# Patient Record
Sex: Female | Born: 1965 | Race: White | Hispanic: No | Marital: Married
Health system: Southern US, Community
[De-identification: ages and names within clinical notes are randomized; demographics above are authoritative.]

## PROBLEM LIST (undated history)

## (undated) DIAGNOSIS — E119 Type 2 diabetes mellitus without complications: Secondary | ICD-10-CM

## (undated) DIAGNOSIS — I10 Essential (primary) hypertension: Secondary | ICD-10-CM

## (undated) DIAGNOSIS — E785 Hyperlipidemia, unspecified: Secondary | ICD-10-CM

## (undated) HISTORY — DX: Hyperlipidemia, unspecified: E78.5

## (undated) HISTORY — DX: Type 2 diabetes mellitus without complications: E11.9

## (undated) HISTORY — DX: Essential (primary) hypertension: I10

---

## 2001-05-13 ENCOUNTER — Encounter: Payer: Self-pay | Admitting: Family Medicine

## 2001-05-13 ENCOUNTER — Ambulatory Visit (HOSPITAL_COMMUNITY): Admission: RE | Admit: 2001-05-13 | Discharge: 2001-05-13 | Payer: Self-pay | Admitting: Family Medicine

## 2002-08-24 ENCOUNTER — Ambulatory Visit (HOSPITAL_COMMUNITY): Admission: RE | Admit: 2002-08-24 | Discharge: 2002-08-24 | Payer: Self-pay | Admitting: Family Medicine

## 2002-08-24 ENCOUNTER — Encounter: Payer: Self-pay | Admitting: Family Medicine

## 2002-09-01 ENCOUNTER — Ambulatory Visit (HOSPITAL_COMMUNITY): Admission: RE | Admit: 2002-09-01 | Discharge: 2002-09-01 | Payer: Self-pay | Admitting: Family Medicine

## 2002-09-01 ENCOUNTER — Encounter: Payer: Self-pay | Admitting: Family Medicine

## 2004-02-01 ENCOUNTER — Ambulatory Visit (HOSPITAL_COMMUNITY): Admission: RE | Admit: 2004-02-01 | Discharge: 2004-02-01 | Payer: Self-pay | Admitting: Family Medicine

## 2004-03-14 ENCOUNTER — Encounter: Admission: RE | Admit: 2004-03-14 | Discharge: 2004-06-12 | Payer: Self-pay | Admitting: Family Medicine

## 2005-04-16 ENCOUNTER — Ambulatory Visit (HOSPITAL_COMMUNITY): Admission: RE | Admit: 2005-04-16 | Discharge: 2005-04-16 | Payer: Self-pay | Admitting: Family Medicine

## 2006-09-12 ENCOUNTER — Other Ambulatory Visit: Admission: RE | Admit: 2006-09-12 | Discharge: 2006-09-12 | Payer: Self-pay | Admitting: Family Medicine

## 2007-05-01 ENCOUNTER — Ambulatory Visit (HOSPITAL_COMMUNITY): Admission: RE | Admit: 2007-05-01 | Discharge: 2007-05-01 | Payer: Self-pay | Admitting: Family Medicine

## 2008-03-01 ENCOUNTER — Other Ambulatory Visit: Admission: RE | Admit: 2008-03-01 | Discharge: 2008-03-01 | Payer: Self-pay | Admitting: Family Medicine

## 2008-11-16 ENCOUNTER — Ambulatory Visit (HOSPITAL_COMMUNITY): Admission: RE | Admit: 2008-11-16 | Discharge: 2008-11-16 | Payer: Self-pay | Admitting: Family Medicine

## 2008-11-22 ENCOUNTER — Encounter: Admission: RE | Admit: 2008-11-22 | Discharge: 2008-11-22 | Payer: Self-pay | Admitting: Family Medicine

## 2009-04-07 ENCOUNTER — Other Ambulatory Visit: Admission: RE | Admit: 2009-04-07 | Discharge: 2009-04-07 | Payer: Self-pay | Admitting: Family Medicine

## 2010-04-10 ENCOUNTER — Other Ambulatory Visit: Admission: RE | Admit: 2010-04-10 | Discharge: 2010-04-10 | Payer: Self-pay | Admitting: Family Medicine

## 2010-06-15 ENCOUNTER — Ambulatory Visit (HOSPITAL_COMMUNITY)
Admission: RE | Admit: 2010-06-15 | Discharge: 2010-06-15 | Payer: Self-pay | Source: Home / Self Care | Attending: Family Medicine | Admitting: Family Medicine

## 2010-07-30 ENCOUNTER — Encounter: Payer: Self-pay | Admitting: Family Medicine

## 2011-11-20 ENCOUNTER — Other Ambulatory Visit (HOSPITAL_COMMUNITY): Payer: Self-pay | Admitting: Family Medicine

## 2011-11-20 DIAGNOSIS — Z1231 Encounter for screening mammogram for malignant neoplasm of breast: Secondary | ICD-10-CM

## 2011-12-18 ENCOUNTER — Ambulatory Visit (HOSPITAL_COMMUNITY)
Admission: RE | Admit: 2011-12-18 | Discharge: 2011-12-18 | Disposition: A | Discharge status: Home / Self Care | Payer: BC Managed Care – PPO | Source: Ambulatory Visit | Attending: Family Medicine | Admitting: Family Medicine

## 2011-12-18 DIAGNOSIS — Z1231 Encounter for screening mammogram for malignant neoplasm of breast: Secondary | ICD-10-CM | POA: Insufficient documentation

## 2012-11-03 DIAGNOSIS — E669 Obesity, unspecified: Secondary | ICD-10-CM | POA: Insufficient documentation

## 2012-11-03 DIAGNOSIS — E7800 Pure hypercholesterolemia, unspecified: Secondary | ICD-10-CM | POA: Insufficient documentation

## 2012-11-03 DIAGNOSIS — F509 Eating disorder, unspecified: Secondary | ICD-10-CM | POA: Insufficient documentation

## 2012-11-03 DIAGNOSIS — E6609 Other obesity due to excess calories: Secondary | ICD-10-CM | POA: Insufficient documentation

## 2012-11-03 DIAGNOSIS — E1169 Type 2 diabetes mellitus with other specified complication: Secondary | ICD-10-CM | POA: Insufficient documentation

## 2012-11-03 DIAGNOSIS — E119 Type 2 diabetes mellitus without complications: Secondary | ICD-10-CM | POA: Insufficient documentation

## 2012-11-03 DIAGNOSIS — I10 Essential (primary) hypertension: Secondary | ICD-10-CM | POA: Insufficient documentation

## 2013-04-08 ENCOUNTER — Other Ambulatory Visit (HOSPITAL_COMMUNITY): Payer: Self-pay | Admitting: Family Medicine

## 2013-04-08 DIAGNOSIS — Z1231 Encounter for screening mammogram for malignant neoplasm of breast: Secondary | ICD-10-CM

## 2013-04-14 ENCOUNTER — Ambulatory Visit (HOSPITAL_COMMUNITY)
Admission: RE | Admit: 2013-04-14 | Discharge: 2013-04-14 | Disposition: A | Discharge status: Home / Self Care | Payer: PRIVATE HEALTH INSURANCE | Source: Ambulatory Visit | Attending: Family Medicine | Admitting: Family Medicine

## 2013-04-14 DIAGNOSIS — Z1231 Encounter for screening mammogram for malignant neoplasm of breast: Secondary | ICD-10-CM | POA: Insufficient documentation

## 2014-09-01 ENCOUNTER — Other Ambulatory Visit: Payer: Self-pay

## 2014-09-01 DIAGNOSIS — Z1231 Encounter for screening mammogram for malignant neoplasm of breast: Secondary | ICD-10-CM

## 2014-09-09 ENCOUNTER — Ambulatory Visit
Admission: RE | Admit: 2014-09-09 | Discharge: 2014-09-09 | Disposition: A | Discharge status: Home / Self Care | Payer: No Typology Code available for payment source | Source: Ambulatory Visit

## 2014-09-09 DIAGNOSIS — Z1231 Encounter for screening mammogram for malignant neoplasm of breast: Secondary | ICD-10-CM

## 2014-12-20 ENCOUNTER — Other Ambulatory Visit: Payer: Self-pay | Admitting: Obstetrics and Gynecology

## 2014-12-20 ENCOUNTER — Other Ambulatory Visit (HOSPITAL_COMMUNITY)
Admission: RE | Admit: 2014-12-20 | Discharge: 2014-12-20 | Disposition: A | Discharge status: Home / Self Care | Payer: No Typology Code available for payment source | Source: Ambulatory Visit | Attending: Obstetrics and Gynecology | Admitting: Obstetrics and Gynecology

## 2014-12-20 DIAGNOSIS — Z1151 Encounter for screening for human papillomavirus (HPV): Secondary | ICD-10-CM | POA: Diagnosis present

## 2014-12-20 DIAGNOSIS — Z01419 Encounter for gynecological examination (general) (routine) without abnormal findings: Secondary | ICD-10-CM | POA: Insufficient documentation

## 2014-12-23 LAB — CYTOLOGY - PAP

## 2016-01-12 ENCOUNTER — Other Ambulatory Visit: Payer: Self-pay | Admitting: Family Medicine

## 2016-01-13 LAB — CMP12+LP+TP+TSH+6AC+CBC/D/PLT
A/G RATIO: 1.7 (ref 1.2–2.2)
ALK PHOS: 85 IU/L (ref 39–117)
ALT: 33 IU/L — ABNORMAL HIGH (ref 0–32)
AST: 21 IU/L (ref 0–40)
Albumin: 3.9 g/dL (ref 3.5–5.5)
BASOS: 0 %
BILIRUBIN TOTAL: 0.2 mg/dL (ref 0.0–1.2)
BUN/Creatinine Ratio: 16 (ref 9–23)
BUN: 12 mg/dL (ref 6–24)
Basophils Absolute: 0 10*3/uL (ref 0.0–0.2)
CHLORIDE: 95 mmol/L — AB (ref 96–106)
CHOL/HDL RATIO: 3.1 ratio (ref 0.0–4.4)
CREATININE: 0.76 mg/dL (ref 0.57–1.00)
Calcium: 8.6 mg/dL — ABNORMAL LOW (ref 8.7–10.2)
Cholesterol, Total: 164 mg/dL (ref 100–199)
EOS (ABSOLUTE): 0.3 10*3/uL (ref 0.0–0.4)
EOS: 4 %
Estimated CHD Risk: <0.5 times avg. (ref 0.0–1.0)
Free Thyroxine Index: 2.4 (ref 1.2–4.9)
GFR, EST AFRICAN AMERICAN: 106 mL/min/{1.73_m2} (ref >59)
GFR, EST NON AFRICAN AMERICAN: 92 mL/min/{1.73_m2} (ref >59)
GGT: 49 IU/L (ref 0–60)
GLUCOSE: 102 mg/dL — AB (ref 65–99)
Globulin, Total: 2.3 g/dL (ref 1.5–4.5)
HDL: 53 mg/dL (ref >39)
HEMATOCRIT: 42.2 % (ref 34.0–46.6)
HEMOGLOBIN: 13.3 g/dL (ref 11.1–15.9)
IRON: 75 ug/dL (ref 27–159)
Immature Grans (Abs): 0 10*3/uL (ref 0.0–0.1)
Immature Granulocytes: 0 %
LDH: 168 IU/L (ref 119–226)
LDL CALC: 81 mg/dL (ref 0–99)
Lymphocytes Absolute: 1.8 10*3/uL (ref 0.7–3.1)
Lymphs: 24 %
MCH: 26.9 pg (ref 26.6–33.0)
MCHC: 31.5 g/dL (ref 31.5–35.7)
MCV: 85 fL (ref 79–97)
MONOCYTES: 7 %
Monocytes Absolute: 0.5 10*3/uL (ref 0.1–0.9)
Neutrophils Absolute: 5.1 10*3/uL (ref 1.4–7.0)
Neutrophils: 65 %
PLATELETS: 410 10*3/uL — AB (ref 150–379)
POTASSIUM: 4.4 mmol/L (ref 3.5–5.2)
Phosphorus: 3.6 mg/dL (ref 2.5–4.5)
RBC: 4.94 x10E6/uL (ref 3.77–5.28)
RDW: 13.8 % (ref 12.3–15.4)
Sodium: 136 mmol/L (ref 134–144)
T3 UPTAKE RATIO: 30 % (ref 24–39)
T4, Total: 8.1 ug/dL (ref 4.5–12.0)
TSH: 1.16 u[IU]/mL (ref 0.450–4.500)
Total Protein: 6.2 g/dL (ref 6.0–8.5)
Triglycerides: 148 mg/dL (ref 0–149)
URIC ACID: 6.6 mg/dL (ref 2.5–7.1)
VLDL CHOLESTEROL CAL: 30 mg/dL (ref 5–40)
WBC: 7.7 10*3/uL (ref 3.4–10.8)

## 2016-01-13 LAB — HGB A1C W/O EAG: HEMOGLOBIN A1C: 6.6 % — AB (ref 4.8–5.6)

## 2016-01-13 LAB — VITAMIN D 25 HYDROXY (VIT D DEFICIENCY, FRACTURES): Vit D, 25-Hydroxy: 38.2 ng/mL (ref 30.0–100.0)

## 2016-02-07 ENCOUNTER — Other Ambulatory Visit: Payer: Self-pay | Admitting: Family Medicine

## 2016-02-07 DIAGNOSIS — R519 Headache, unspecified: Secondary | ICD-10-CM

## 2016-02-07 DIAGNOSIS — H532 Diplopia: Secondary | ICD-10-CM

## 2016-02-07 DIAGNOSIS — R51 Headache: Secondary | ICD-10-CM

## 2016-02-10 ENCOUNTER — Ambulatory Visit
Admission: RE | Admit: 2016-02-10 | Discharge: 2016-02-10 | Disposition: A | Discharge status: Home / Self Care | Payer: No Typology Code available for payment source | Source: Ambulatory Visit | Attending: Family Medicine | Admitting: Family Medicine

## 2016-02-10 DIAGNOSIS — H532 Diplopia: Secondary | ICD-10-CM

## 2016-02-10 DIAGNOSIS — R51 Headache: Secondary | ICD-10-CM

## 2016-02-10 DIAGNOSIS — R519 Headache, unspecified: Secondary | ICD-10-CM

## 2016-02-10 MED ORDER — GADOBENATE DIMEGLUMINE 529 MG/ML IV SOLN
20.0000 mL | Freq: Once | INTRAVENOUS | Status: AC | PRN
  Administered 2016-02-10: 20 mL via INTRAVENOUS

## 2016-04-20 ENCOUNTER — Other Ambulatory Visit: Payer: Self-pay | Admitting: Family Medicine

## 2016-04-20 DIAGNOSIS — Z1231 Encounter for screening mammogram for malignant neoplasm of breast: Secondary | ICD-10-CM

## 2016-04-30 ENCOUNTER — Other Ambulatory Visit: Payer: Self-pay | Admitting: Family Medicine

## 2016-04-30 DIAGNOSIS — Z1231 Encounter for screening mammogram for malignant neoplasm of breast: Secondary | ICD-10-CM

## 2016-05-21 ENCOUNTER — Ambulatory Visit
Admission: RE | Admit: 2016-05-21 | Discharge: 2016-05-21 | Disposition: A | Discharge status: Home / Self Care | Payer: No Typology Code available for payment source | Source: Ambulatory Visit | Attending: Family Medicine | Admitting: Family Medicine

## 2016-05-21 DIAGNOSIS — Z1231 Encounter for screening mammogram for malignant neoplasm of breast: Secondary | ICD-10-CM

## 2017-02-04 ENCOUNTER — Ambulatory Visit: Payer: Self-pay | Admitting: *Deleted

## 2017-02-04 VITALS — BP 130/88 | HR 76 | Ht 65.5 in | Wt 242.0 lb

## 2017-02-04 DIAGNOSIS — Z0000 Encounter for general adult medical examination without abnormal findings: Secondary | ICD-10-CM

## 2017-02-04 NOTE — Progress Notes (Signed)
[employer] insurance premium discount evaluation: Labs Drawn. Replacements ROI form signed. Tobacco Free Attestation form signed.  Forms placed in paper chart.  Ok to route results to pcp per pt request.

## 2017-02-05 LAB — CMP12+LP+TP+TSH+6AC+CBC/D/PLT
ALBUMIN: 4.2 g/dL (ref 3.5–5.5)
ALK PHOS: 89 IU/L (ref 39–117)
ALT: 37 IU/L — AB (ref 0–32)
AST: 26 IU/L (ref 0–40)
Albumin/Globulin Ratio: 1.9 (ref 1.2–2.2)
BASOS: 0 %
BUN / CREAT RATIO: 19 (ref 9–23)
BUN: 15 mg/dL (ref 6–24)
Basophils Absolute: 0 10*3/uL (ref 0.0–0.2)
Bilirubin Total: 0.3 mg/dL (ref 0.0–1.2)
CALCIUM: 9 mg/dL (ref 8.7–10.2)
CHLORIDE: 99 mmol/L (ref 96–106)
CHOLESTEROL TOTAL: 168 mg/dL (ref 100–199)
CREATININE: 0.78 mg/dL (ref 0.57–1.00)
Chol/HDL Ratio: 3.1 ratio (ref 0.0–4.4)
EOS (ABSOLUTE): 0.3 10*3/uL (ref 0.0–0.4)
EOS: 4 %
FREE THYROXINE INDEX: 2 (ref 1.2–4.9)
GFR calc Af Amer: 101 mL/min/{1.73_m2} (ref >59)
GFR, EST NON AFRICAN AMERICAN: 88 mL/min/{1.73_m2} (ref >59)
GGT: 47 IU/L (ref 0–60)
GLUCOSE: 110 mg/dL — AB (ref 65–99)
Globulin, Total: 2.2 g/dL (ref 1.5–4.5)
HDL: 55 mg/dL (ref >39)
HEMATOCRIT: 40.4 % (ref 34.0–46.6)
HEMOGLOBIN: 13.9 g/dL (ref 11.1–15.9)
IMMATURE GRANULOCYTES: 0 %
IRON: 50 ug/dL (ref 27–159)
Immature Grans (Abs): 0 10*3/uL (ref 0.0–0.1)
LDH: 154 IU/L (ref 119–226)
LDL CALC: 85 mg/dL (ref 0–99)
LYMPHS ABS: 2.1 10*3/uL (ref 0.7–3.1)
Lymphs: 28 %
MCH: 28.1 pg (ref 26.6–33.0)
MCHC: 34.4 g/dL (ref 31.5–35.7)
MCV: 82 fL (ref 79–97)
MONOS ABS: 0.5 10*3/uL (ref 0.1–0.9)
Monocytes: 6 %
NEUTROS ABS: 4.7 10*3/uL (ref 1.4–7.0)
NEUTROS PCT: 62 %
POTASSIUM: 5 mmol/L (ref 3.5–5.2)
Phosphorus: 3.6 mg/dL (ref 2.5–4.5)
Platelets: 397 10*3/uL — ABNORMAL HIGH (ref 150–379)
RBC: 4.94 x10E6/uL (ref 3.77–5.28)
RDW: 14 % (ref 12.3–15.4)
SODIUM: 139 mmol/L (ref 134–144)
T3 Uptake Ratio: 25 % (ref 24–39)
T4, Total: 7.9 ug/dL (ref 4.5–12.0)
TOTAL PROTEIN: 6.4 g/dL (ref 6.0–8.5)
TSH: 1.63 u[IU]/mL (ref 0.450–4.500)
Triglycerides: 138 mg/dL (ref 0–149)
URIC ACID: 7.5 mg/dL — AB (ref 2.5–7.1)
VLDL Cholesterol Cal: 28 mg/dL (ref 5–40)
WBC: 7.6 10*3/uL (ref 3.4–10.8)

## 2017-02-05 LAB — HGB A1C W/O EAG: HEMOGLOBIN A1C: 6.2 % — AB (ref 4.8–5.6)

## 2017-02-05 NOTE — Progress Notes (Signed)
Results reviewed with pt. A1c improved from previous, still elevated. ALT mildly elevated, worse than previous. Diet and exercise recommendations given for improving A1c, weight loss. Follow up with pcp. Results routed to pcp per pt request.

## 2017-02-06 ENCOUNTER — Other Ambulatory Visit: Payer: Self-pay | Admitting: Family Medicine

## 2017-02-06 DIAGNOSIS — M7989 Other specified soft tissue disorders: Secondary | ICD-10-CM

## 2017-02-07 ENCOUNTER — Ambulatory Visit
Admission: RE | Admit: 2017-02-07 | Discharge: 2017-02-07 | Disposition: A | Discharge status: Home / Self Care | Payer: No Typology Code available for payment source | Source: Ambulatory Visit | Attending: Family Medicine | Admitting: Family Medicine

## 2017-02-07 DIAGNOSIS — M7989 Other specified soft tissue disorders: Secondary | ICD-10-CM

## 2017-05-23 ENCOUNTER — Ambulatory Visit: Payer: Self-pay | Admitting: Registered Nurse

## 2017-05-23 VITALS — BP 134/90 | HR 101 | Temp 98.3°F

## 2017-05-23 DIAGNOSIS — J181 Lobar pneumonia, unspecified organism: Principal | ICD-10-CM

## 2017-05-23 DIAGNOSIS — J302 Other seasonal allergic rhinitis: Secondary | ICD-10-CM

## 2017-05-23 DIAGNOSIS — J189 Pneumonia, unspecified organism: Secondary | ICD-10-CM

## 2017-05-23 MED ORDER — AMOXICILLIN-POT CLAVULANATE 875-125 MG PO TABS: 1.0000 | ORAL_TABLET | Freq: Two times a day (BID) | ORAL | 0 refills | Status: DC

## 2017-05-23 MED ORDER — SALINE SPRAY 0.65 % NA SOLN: 2.0000 | NASAL | 0 refills | Status: DC

## 2017-05-23 MED ORDER — FLUTICASONE PROPIONATE 50 MCG/ACT NA SUSP: 1.0000 | Freq: Two times a day (BID) | NASAL | 0 refills | Status: DC

## 2017-05-23 NOTE — Patient Instructions (Addendum)
Restart using inhaler 2 puffs albuterol am/pm and as needed at work Continue flonase 1 spray each nostril twice a day May continue mucinex am and afternoon but not before bedtime Honey with lemon or nyquil/cough syrup OTC prn cough/cough lozenges every 2 hours as needed Hydrate to keep urine pale yellow clear augmentin 875mg  by mouth every 12 hours x 10 days  Allergic Rhinitis Allergic rhinitis is when the mucous membranes in the nose respond to allergens. Allergens are particles in the air that cause your body to have an allergic reaction. This causes you to release allergic antibodies. Through a chain of events, these eventually cause you to release histamine into the blood stream. Although meant to protect the body, it is this release of histamine that causes your discomfort, such as frequent sneezing, congestion, and an itchy, runny nose. What are the causes? Seasonal allergic rhinitis (hay fever) is caused by pollen allergens that may come from grasses, trees, and weeds. Year-round allergic rhinitis (perennial allergic rhinitis) is caused by allergens such as house dust mites, pet dander, and mold spores. What are the signs or symptoms?  Nasal stuffiness (congestion).  Itchy, runny nose with sneezing and tearing of the eyes. How is this diagnosed? Your health care provider can help you determine the allergen or allergens that trigger your symptoms. If you and your health care provider are unable to determine the allergen, skin or blood testing may be used. Your health care provider will diagnose your condition after taking your health history and performing a physical exam. Your health care provider may assess you for other related conditions, such as asthma, pink eye, or an ear infection. How is this treated? Allergic rhinitis does not have a cure, but it can be controlled by:  Medicines that block allergy symptoms. These may include allergy shots, nasal sprays, and oral  antihistamines.  Avoiding the allergen.  Hay fever may often be treated with antihistamines in pill or nasal spray forms. Antihistamines block the effects of histamine. There are over-the-counter medicines that may help with nasal congestion and swelling around the eyes. Check with your health care provider before taking or giving this medicine. If avoiding the allergen or the medicine prescribed do not work, there are many new medicines your health care provider can prescribe. Stronger medicine may be used if initial measures are ineffective. Desensitizing injections can be used if medicine and avoidance does not work. Desensitization is when a patient is given ongoing shots until the body becomes less sensitive to the allergen. Make sure you follow up with your health care provider if problems continue. Follow these instructions at home: It is not possible to completely avoid allergens, but you can reduce your symptoms by taking steps to limit your exposure to them. It helps to know exactly what you are allergic to so that you can avoid your specific triggers. Contact a health care provider if:  You have a fever.  You develop a cough that does not stop easily (persistent).  You have shortness of breath.  You start wheezing.  Symptoms interfere with normal daily activities. This information is not intended to replace advice given to you by your health care provider. Make sure you discuss any questions you have with your health care provider. Document Released: 03/20/2001 Document Revised: 02/24/2016 Document Reviewed: 03/02/2013 Elsevier Interactive Patient Education  2017 [employer] Sinus Rinse What is a sinus rinse? A sinus rinse is a simple home treatment that is used to rinse your sinuses with a  sterile mixture of salt and water (saline solution). Sinuses are air-filled spaces in your skull behind the bones of your face and forehead that open into your nasal cavity. You will use the  following:  Saline solution.  Neti pot or spray bottle. This releases the saline solution into your nose and through your sinuses. Neti pots and spray bottles can be purchased at [facility] local [facility], a health food store, or online.  When would I do a sinus rinse? A sinus rinse can help to clear mucus, dirt, dust, or pollen from the nasal cavity. You may do a sinus rinse when you have a cold, a virus, nasal allergy symptoms, a sinus infection, or stuffiness in the nose or sinuses. If you are considering a sinus rinse:  Ask your child's health care provider before performing a sinus rinse on your child.  Do not do a sinus rinse if you have had ear or nasal surgery, ear infection, or blocked ears.  How do I do a sinus rinse?  Wash your hands.  Disinfect your device according to the directions provided and then dry it.  Use the solution that comes with your device or one that is sold separately in stores. Follow the mixing directions on the package.  Fill your device with the amount of saline solution as directed by the device instructions.  Stand over a sink and tilt your head sideways over the sink.  Place the spout of the device in your upper nostril (the one closer to the ceiling).  Gently pour or squeeze the saline solution into the nasal cavity. The liquid should drain to the lower nostril if you are not overly congested.  Gently blow your nose. Blowing too hard may cause ear pain.  Repeat in the other nostril.  Clean and rinse your device with clean water and then air-dry it. Are there risks of a sinus rinse? Sinus rinse is generally very safe and effective. However, there are a few risks, which include:  A burning sensation in the sinuses. This may happen if you do not make the saline solution as directed. Make sure to follow all directions when making the saline solution.  Infection from contaminated water. This is rare, but possible.  Nasal irritation.  This  information is not intended to replace advice given to you by your health care provider. Make sure you discuss any questions you have with your health care provider. Document Released: 01/20/2014 Document Revised: 05/22/2016 Document Reviewed: 11/10/2013 Elsevier Interactive Patient Education  2017 [employer] How to Use a Metered Dose Inhaler A metered dose inhaler is a handheld device for taking medicine that must be breathed into the lungs (inhaled). The device can be used to deliver a variety of inhaled medicines, including:  Quick relief or rescue medicines, such as bronchodilators.  Controller medicines, such as corticosteroids.  The medicine is delivered by pushing down on a metal canister to release a preset amount of spray and medicine. Each device contains the amount of medicine that is needed for a preset number of uses (inhalations). Your health care provider may recommend that you use a spacer with your inhaler to help you take the medicine more effectively. A spacer is a plastic tube with a mouthpiece on one end and an opening that connects to the inhaler on the other end. A spacer holds the medicine in a tube for a short time, which allows you to inhale more medicine. What are the risks? If you do not use your  inhaler correctly, medicine might not reach your lungs to help you breathe. Inhaler medicine can cause side effects, such as:  Mouth or throat infection.  Cough.  Hoarseness.  Headache.  Nausea and vomiting.  Lung infection (pneumonia) in people who have a lung condition called COPD.  How to use a metered dose inhaler without a spacer 1. Remove the cap from the inhaler. 2. If you are using the inhaler for the first time, shake it for 5 seconds, turn it away from your face, then release 4 puffs into the air. This is called priming. 3. Shake the inhaler for 5 seconds. 4. Position the inhaler so the top of the canister faces up. 5. Put your index finger on the  top of the medicine canister. Support the bottom of the inhaler with your thumb. 6. Breathe out normally and as completely as possible, away from the inhaler. 7. Either place the inhaler between your teeth and close your lips tightly around the mouthpiece, or hold the inhaler 1-2 inches (2.5-5 cm) away from your open mouth. Keep your tongue down out of the way. If you are unsure which technique to use, ask your health care provider. 8. Press the canister down with your index finger to release the medicine, then inhale deeply and slowly through your mouth (not your nose) until your lungs are completely filled. Inhaling should take 4-6 seconds. 9. Hold the medicine in your lungs for 5-10 seconds (10 seconds is best). This helps the medicine get into the small airways of your lungs. 10. With your lips in a tight circle (pursed), breathe out slowly. 11. Repeat steps 3-10 until you have taken the number of puffs that your health care provider directed. Wait about 1 minute between puffs or as directed. 12. Put the cap on the inhaler. 13. If you are using a steroid inhaler, rinse your mouth with water, gargle, and spit out the water. Do not swallow the water. How to use a metered dose inhaler with a spacer 1. Remove the cap from the inhaler. 2. If you are using the inhaler for the first time, shake it for 5 seconds, turn it away from your face, then release 4 puffs into the air. This is called priming. 3. Shake the inhaler for 5 seconds. 4. Place the open end of the spacer onto the inhaler mouthpiece. 5. Position the inhaler so the top of the canister faces up and the spacer mouthpiece faces you. 6. Put your index finger on the top of the medicine canister. Support the bottom of the inhaler and the spacer with your thumb. 7. Breathe out normally and as completely as possible, away from the spacer. 8. Place the spacer between your teeth and close your lips tightly around it. Keep your tongue down out of the  way. 9. Press the canister down with your index finger to release the medicine, then inhale deeply and slowly through your mouth (not your nose) until your lungs are completely filled. Inhaling should take 4-6 seconds. 10. Hold the medicine in your lungs for 5-10 seconds (10 seconds is best). This helps the medicine get into the small airways of your lungs. 11. With your lips in a tight circle (pursed), breathe out slowly. 12. Repeat steps 3-11 until you have taken the number of puffs that your health care provider directed. Wait about 1 minute between puffs or as directed. 13. Remove the spacer from the inhaler and put the cap on the inhaler. 14. If you are using  a steroid inhaler, rinse your mouth with water, gargle, and spit out the water. Do not swallow the water. Follow these instructions at home:  Take your inhaled medicine only as told by your health care provider. Do not use the inhaler more than directed by your health care provider.  Keep all follow-up visits as told by your health care provider. This is important.  If your inhaler has a counter, you can check it to determine how full your inhaler is. If your inhaler does not have a counter, ask your health care provider when you will need to refill your inhaler and write the refill date on a calendar or on your inhaler canister. Note that you cannot know when an inhaler is empty by shaking it.  Follow directions on the package insert for care and cleaning of your inhaler and spacer. Contact a health care provider if:  Symptoms are only partially relieved with your inhaler.  You are having trouble using your inhaler.  You have an increase in phlegm.  You have headaches. Get help right away if:  You feel little or no relief after using your inhaler.  You have dizziness.  You have a fast heart rate.  You have chills or a fever.  You have night sweats.  There is blood in your phlegm. Summary  A metered dose inhaler is a  handheld device for taking medicine that must be breathed into the lungs (inhaled).  The medicine is delivered by pushing down on a metal canister to release a preset amount of spray and medicine.  Each device contains the amount of medicine that is needed for a preset number of uses (inhalations). This information is not intended to replace advice given to you by your health care provider. Make sure you discuss any questions you have with your health care provider. Document Released: 06/25/2005 Document Revised: 05/15/2016 Document Reviewed: 05/15/2016 Elsevier Interactive Patient Education  2017 [employer]. Community-Acquired Pneumonia, Adult Pneumonia is an infection of the lungs. One type of pneumonia can happen while a person is in a hospital. A different type can happen when a person is not in a hospital (community-acquired pneumonia). It is easy for this kind to spread from person to person. It can spread to you if you breathe near an infected person who coughs or sneezes. Some symptoms include:  A dry cough.  A wet (productive) cough.  Fever.  Sweating.  Chest pain.  Follow these instructions at home:  Take over-the-counter and prescription medicines only as told by your doctor. ? Only take cough medicine if you are losing sleep. ? If you were prescribed an antibiotic medicine, take it as told by your doctor. Do not stop taking the antibiotic even if you start to feel better.  Sleep with your head and neck raised (elevated). You can do this by putting a few pillows under your head, or you can sleep in a recliner.  Do not use tobacco products. These include cigarettes, chewing tobacco, and e-cigarettes. If you need help quitting, ask your doctor.  Drink enough water to keep your pee (urine) clear or pale yellow. A shot (vaccine) can help prevent pneumonia. Shots are often suggested for:  People older than 52 years of age.  People older than 52 years of age: ? Who are  having cancer treatment. ? Who have long-term (chronic) lung disease. ? Who have problems with their body's defense system (immune system).  You may also prevent pneumonia if you take these actions:  Get the flu (influenza) shot every year.  Go to the dentist as often as told.  Wash your hands often. If soap and water are not available, use hand sanitizer.  Contact a doctor if:  You have a fever.  You lose sleep because your cough medicine does not help. Get help right away if:  You are short of breath and it gets worse.  You have more chest pain.  Your sickness gets worse. This is very serious if: ? You are an older adult. ? Your body's defense system is weak.  You cough up blood. This information is not intended to replace advice given to you by your health care provider. Make sure you discuss any questions you have with your health care provider. Document Released: 12/12/2007 Document Revised: 12/01/2015 Document Reviewed: 10/20/2014 Elsevier Interactive Patient Education  2018 [employer]. Acute Bronchitis, Adult Acute bronchitis is sudden (acute) swelling of the air tubes (bronchi) in the lungs. Acute bronchitis causes these tubes to fill with mucus, which can make it hard to breathe. It can also cause coughing or wheezing. In adults, acute bronchitis usually goes away within 2 weeks. A cough caused by bronchitis may last up to 3 weeks. Smoking, allergies, and asthma can make the condition worse. Repeated episodes of bronchitis may cause further lung problems, such as chronic obstructive pulmonary disease (COPD). What are the causes? This condition can be caused by germs and by substances that irritate the lungs, including:  Cold and flu viruses. This condition is most often caused by the same virus that causes a cold.  Bacteria.  Exposure to tobacco smoke, dust, fumes, and air pollution.  What increases the risk? This condition is more likely to develop in people  who:  Have close contact with someone with acute bronchitis.  Are exposed to lung irritants, such as tobacco smoke, dust, fumes, and vapors.  Have a weak immune system.  Have a respiratory condition such as asthma.  What are the signs or symptoms? Symptoms of this condition include:  A cough.  Coughing up clear, yellow, or green mucus.  Wheezing.  Chest congestion.  Shortness of breath.  A fever.  Body aches.  Chills.  A sore throat.  How is this diagnosed? This condition is usually diagnosed with a physical exam. During the exam, your health care provider may order tests, such as chest X-rays, to rule out other conditions. He or she may also:  Test a sample of your mucus for bacterial infection.  Check the level of oxygen in your blood. This is done to check for pneumonia.  Do a chest X-ray or lung function testing to rule out pneumonia and other conditions.  Perform blood tests.  Your health care provider will also ask about your symptoms and medical history. How is this treated? Most cases of acute bronchitis clear up over time without treatment. Your health care provider may recommend:  Drinking more fluids. Drinking more makes your mucus thinner, which may make it easier to breathe.  Taking a medicine for a fever or cough.  Taking an antibiotic medicine.  Using an inhaler to help improve shortness of breath and to control a cough.  Using a cool mist vaporizer or humidifier to make it easier to breathe.  Follow these instructions at home: Medicines  Take over-the-counter and prescription medicines only as told by your health care provider.  If you were prescribed an antibiotic, take it as told by your health care provider. Do not stop taking  the antibiotic even if you start to feel better. General instructions  Get plenty of rest.  Drink enough fluids to keep your urine clear or pale yellow.  Avoid smoking and secondhand smoke. Exposure to  cigarette smoke or irritating chemicals will make bronchitis worse. If you smoke and you need help quitting, ask your health care provider. Quitting smoking will help your lungs heal faster.  Use an inhaler, cool mist vaporizer, or humidifier as told by your health care provider.  Keep all follow-up visits as told by your health care provider. This is important. How is this prevented? To lower your risk of getting this condition again:  Wash your hands often with soap and water. If soap and water are not available, use hand sanitizer.  Avoid contact with people who have cold symptoms.  Try not to touch your hands to your mouth, nose, or eyes.  Make sure to get the flu shot every year.  Contact a health care provider if:  Your symptoms do not improve in 2 weeks of treatment. Get help right away if:  You cough up blood.  You have chest pain.  You have severe shortness of breath.  You become dehydrated.  You faint or keep feeling like you are going to faint.  You keep vomiting.  You have a severe headache.  Your fever or chills gets worse. This information is not intended to replace advice given to you by your health care provider. Make sure you discuss any questions you have with your health care provider. Document Released: 08/02/2004 Document Revised: 01/18/2016 Document Reviewed: 12/14/2015 Elsevier Interactive Patient Education  2017 [employer].

## 2017-05-23 NOTE — Progress Notes (Signed)
Subjective:    Patient ID: Christine Rojas, female    DOB: Nov 20, 1965, 51 y.o.   MRN: [id number]  51y/o caucasian female established Pt reports productive cough x1 month yellow thick tastes awful morning typically. Dx with bronchitis 2 weeks ago at urgent care. Was given cough syrup but cannot take it due to it having hydrocodone in it and she wakes up to drowsy in the am from taking it at night. Finished steroid oral taper 60/50/40/30/20/10 and stopped inhaler at same time.  Last used a couple days ago.  Has been taking mucinex DM at bedtime now still waking up coughing.  Bought OTC flonase using 1 spray twice a day.  Has nasal saline at home but doesn't like using it.   Was told it was viral bronchitis, but since symptoms have persisted, questioning if it is bacterial instead.  Wheezing + especially am or with coughing, feels tight in neck and epigastric area.  Sometimes throws up with coughing typically mucous in the morning.      Review of Systems  Constitutional: Positive for fatigue. Negative for activity change, appetite change, chills, diaphoresis, fever and unexpected weight change.  HENT: Positive for postnasal drip and rhinorrhea. Negative for congestion, dental problem, drooling, ear discharge, ear pain, facial swelling, hearing loss, mouth sores, nosebleeds, sinus pressure, sinus pain, sneezing, sore throat, tinnitus, trouble swallowing and voice change.   Eyes: Negative for photophobia, pain, discharge, redness, itching and visual disturbance.  Respiratory: Positive for cough, chest tightness and wheezing. Negative for choking, shortness of breath and stridor.   Cardiovascular: Negative for chest pain, palpitations and leg swelling.  Gastrointestinal: Positive for vomiting. Negative for abdominal distention, abdominal pain, blood in stool, constipation, diarrhea and nausea.  Endocrine: Negative for cold intolerance and heat intolerance.  Genitourinary: Negative for difficulty  urinating, dysuria and hematuria.  Musculoskeletal: Positive for myalgias. Negative for arthralgias, back pain, gait problem, joint swelling, neck pain and neck stiffness.  Skin: Negative for color change, pallor, rash and wound.  Allergic/Immunologic: Positive for environmental allergies. Negative for food allergies.  Neurological: Negative for dizziness, tremors, seizures, syncope, facial asymmetry, speech difficulty, weakness, light-headedness, numbness and headaches.  Hematological: Negative for adenopathy. Does not bruise/bleed easily.  Psychiatric/Behavioral: Positive for sleep disturbance. Negative for agitation, behavioral problems and confusion.       Objective:   Physical Exam  Constitutional: She is oriented to person, place, and time. She appears well-developed and well-nourished. She is active and cooperative.  Non-toxic appearance. She does not have a sickly appearance. She appears ill. No distress.  HENT:  Head: Normocephalic and atraumatic.  Right Ear: Hearing, external ear and ear canal normal. A middle ear effusion is present.  Left Ear: Hearing, external ear and ear canal normal. A middle ear effusion is present.  Nose: Mucosal edema and rhinorrhea present. No nose lacerations, sinus tenderness, nasal deformity, septal deviation or nasal septal hematoma. No epistaxis.  No foreign bodies. Right sinus exhibits no maxillary sinus tenderness and no frontal sinus tenderness. Left sinus exhibits no maxillary sinus tenderness and no frontal sinus tenderness.  Mouth/Throat: Uvula is midline and mucous membranes are normal. Mucous membranes are not pale, not dry and not cyanotic. She does not have dentures. No oral lesions. No trismus in the jaw. Normal dentition. No dental abscesses, uvula swelling, lacerations or dental caries. Posterior oropharyngeal edema and posterior oropharyngeal erythema present. No oropharyngeal exudate or tonsillar abscesses.  Cobblestoning posterior pharynx;  bilateral TMs air fluid level clear; bilateral nasal  turbinates edema/erythema clear discharge  Eyes: Conjunctivae, EOM and lids are normal. Pupils are equal, round, and reactive to light. Right eye exhibits no chemosis, no discharge, no exudate and no hordeolum. No foreign body present in the right eye. Left eye exhibits no chemosis, no discharge, no exudate and no hordeolum. No foreign body present in the left eye. Right conjunctiva is not injected. Right conjunctiva has no hemorrhage. Left conjunctiva is not injected. Left conjunctiva has no hemorrhage. No scleral icterus. Right eye exhibits normal extraocular motion and no nystagmus. Left eye exhibits normal extraocular motion and no nystagmus. Right pupil is round and reactive. Left pupil is round and reactive. Pupils are equal.  Neck: Trachea normal, normal range of motion and phonation normal. Neck supple. No tracheal tenderness and no muscular tenderness present. No neck rigidity. No tracheal deviation, no edema, no erythema and normal range of motion present. No thyroid mass and no thyromegaly present.  Cardiovascular: Normal rate, regular rhythm, S1 normal, S2 normal, normal heart sounds and intact distal pulses. PMI is not displaced. Exam reveals no gallop and no friction rub.  No murmur heard. Pulses:      Radial pulses are 2+ on the right side, and 2+ on the left side.  Pulmonary/Chest: Effort normal. No accessory muscle usage or stridor. No respiratory distress. She has decreased breath sounds in the right upper field and the left upper field. She has wheezes in the right upper field, the right middle field, the right lower field and the left upper field. She has rhonchi in the right middle field. She has no rales. She exhibits no tenderness.  Inspiratory wheeze RLF, expiratory wheeze RUF/RMF/LUF and decreased breath sounds after coughing RUF/LUF; wheeze persisted through nonproductive cough and afterwards; spoke full sentences easily when not  coughing, nonproductive cough in exam room frequent; repeat spo2 RA 98% after coughing  Abdominal: Soft. Normal appearance. She exhibits no distension, no fluid wave and no ascites. There is no rigidity and no guarding.  Musculoskeletal: Normal range of motion. She exhibits no edema or tenderness.       Right shoulder: Normal.       Left shoulder: Normal.       Right hip: Normal.       Left hip: Normal.       Right knee: Normal.       Left knee: Normal.       Cervical back: Normal.       Thoracic back: Normal.       Lumbar back: Normal.       Right hand: Normal.       Left hand: Normal.  Lymphadenopathy:       Head (right side): No submental, no submandibular, no tonsillar, no preauricular, no posterior auricular and no occipital adenopathy present.       Head (left side): No submental, no submandibular, no tonsillar, no preauricular, no posterior auricular and no occipital adenopathy present.    She has no cervical adenopathy.       Right cervical: No superficial cervical, no deep cervical and no posterior cervical adenopathy present.      Left cervical: No superficial cervical, no deep cervical and no posterior cervical adenopathy present.  Neurological: She is alert and oriented to person, place, and time. She has normal strength. She is not disoriented. She displays no atrophy and no tremor. No cranial nerve deficit or sensory deficit. She exhibits normal muscle tone. She displays no seizure activity. Coordination and gait normal. GCS  eye subscore is 4. GCS verbal subscore is 5. GCS motor subscore is 6.  On/off exam table; in/out of chair without difficulty; gait sure and steady in hallway  Skin: Skin is warm, dry and intact. No abrasion, no bruising, no burn, no ecchymosis, no laceration, no lesion, no petechiae and no rash noted. She is not diaphoretic. No cyanosis or erythema. No pallor. Nails show no clubbing.  Psychiatric: She has a normal mood and affect. Her speech is normal and  behavior is normal. Judgment and thought content normal. Cognition and memory are normal.  Nursing note and vitals reviewed.         Assessment & Plan:  A-community acquired pneumonia, seasonal allergic rhinitis  P-Rx Augmentin 875mg  po BID x 10 days #20 RF0 dispensed from [facility].  Cough lozenges po q2h prn cough  Had Prednisone taper already will hold on repeat.  Has Rx cough medicine at home makes her too drowsy.  Discussed use inhaler and OTC nyquil/robitussin instead per manufacturer's instructions.  Albuterol MDI 90mcg 1-2 puffs po q4-6h prn protracted cough/wheeze #1 RF0 side effect increased heart rate. Pneumonia and Bronchitis simple, community acquired, may have started as viral (probably respiratory syncytial, parainfluenza, influenza, or adenovirus), but now evidence of acute purulent resulting in bronchial edema and mucus formation.  other considerations being cough from upper respiratory tract infections, sinusitis or allergic syndromes (mild asthma or viral pneumonia).  Differential Diagnoses:  reactive airway disease (asthma, allergic aspergillosis (eosinophilia), chronic bronchitis, respiratory infection (sinusitis, common cold, pneumonia), congestive heart failure, reflux esophagitis, bronchogenic tumor, aspiration syndromes and/or exposure to pulmonary irritants/smoke. Discussed to hydrate as hyperkalemia possible with interaction her chronic medications.  Tylenol preferred over motrin due to interaction with her chronic medications/BP meds may raise BP and/or cause bleeding.  Without high fever, severe dyspnea, lack of physical findings or other risk factors, I will hold on a chest radiograph and CBC at this time.  I discussed that approximately 50% of patients with acute bronchitis have a cough that lasts up to three weeks, and 25% for over a month.  Tylenol 500mg  one to two tablets every four to six hours as needed for fever or myalgias given 4 UD from clinic stock. Exitcare handout on  pneumonia, bronchitis and inhaler use given to patient.  ER if hemopthysis, SOB, worst chest pain of life.   Patient instructed to follow up in one week or sooner if symptoms worsen.  Patient verbalized agreement and understanding of treatment plan.  P2:  hand washing and cover cough  Patient may use normal saline nasal spray 2 sprays each nostril q2h wa as needed. Refilled flonase 50mcg 1 spray each nostril BID #1 RF0 from [facility].  Patient denied personal or family history of ENT cancer.  OTC antihistamine of choice claritin/zyrtec 10mg  po daily.  Avoid triggers if possible.  Shower prior to bedtime if exposed to triggers.  If allergic dust/dust mites recommend mattress/pillow covers/encasements; washing linens, vacuuming, sweeping, dusting weekly.  Call or return to clinic as needed if these symptoms worsen or fail to improve as anticipated.   Exitcare handout on allergic rhinitis and sinus rinse given to patient.  Patient verbalized understanding of instructions, agreed with plan of care and had no further questions at this time.  P2:  Avoidance and hand washing.

## 2017-07-30 ENCOUNTER — Other Ambulatory Visit: Payer: Self-pay | Admitting: *Deleted

## 2017-07-30 DIAGNOSIS — E7800 Pure hypercholesterolemia, unspecified: Secondary | ICD-10-CM

## 2017-07-30 DIAGNOSIS — E119 Type 2 diabetes mellitus without complications: Secondary | ICD-10-CM

## 2017-07-30 DIAGNOSIS — I10 Essential (primary) hypertension: Secondary | ICD-10-CM

## 2017-07-30 NOTE — Progress Notes (Signed)
Orders from pcp in paper chart. Executive panel and A1c ordered as opposed to individual ordered tests for cost efficiency.

## 2017-07-31 LAB — CMP12+LP+TP+TSH+6AC+CBC/D/PLT
A/G RATIO: 1.7 (ref 1.2–2.2)
ALBUMIN: 4.5 g/dL (ref 3.5–5.5)
ALT: 34 IU/L — AB (ref 0–32)
AST: 19 IU/L (ref 0–40)
Alkaline Phosphatase: 89 IU/L (ref 39–117)
BUN/Creatinine Ratio: 22 (ref 9–23)
BUN: 17 mg/dL (ref 6–24)
Basophils Absolute: 0 10*3/uL (ref 0.0–0.2)
Basos: 0 %
Bilirubin Total: 0.2 mg/dL (ref 0.0–1.2)
CALCIUM: 9.5 mg/dL (ref 8.7–10.2)
CHOL/HDL RATIO: 3.4 ratio (ref 0.0–4.4)
CHOLESTEROL TOTAL: 198 mg/dL (ref 100–199)
Chloride: 95 mmol/L — ABNORMAL LOW (ref 96–106)
Creatinine, Ser: 0.77 mg/dL (ref 0.57–1.00)
EOS (ABSOLUTE): 0.3 10*3/uL (ref 0.0–0.4)
Eos: 4 %
Estimated CHD Risk: <0.5 times avg. (ref 0.0–1.0)
Free Thyroxine Index: 2 (ref 1.2–4.9)
GFR calc Af Amer: 103 mL/min/{1.73_m2} (ref >59)
GFR, EST NON AFRICAN AMERICAN: 89 mL/min/{1.73_m2} (ref >59)
GGT: 62 IU/L — AB (ref 0–60)
GLOBULIN, TOTAL: 2.7 g/dL (ref 1.5–4.5)
Glucose: 115 mg/dL — ABNORMAL HIGH (ref 65–99)
HDL: 59 mg/dL (ref >39)
Hematocrit: 44.6 % (ref 34.0–46.6)
Hemoglobin: 14.8 g/dL (ref 11.1–15.9)
IMMATURE GRANS (ABS): 0 10*3/uL (ref 0.0–0.1)
IRON: 63 ug/dL (ref 27–159)
Immature Granulocytes: 0 %
LDH: 168 IU/L (ref 119–226)
LDL Calculated: 105 mg/dL — ABNORMAL HIGH (ref 0–99)
LYMPHS: 22 %
Lymphocytes Absolute: 2 10*3/uL (ref 0.7–3.1)
MCH: 27.7 pg (ref 26.6–33.0)
MCHC: 33.2 g/dL (ref 31.5–35.7)
MCV: 83 fL (ref 79–97)
MONOS ABS: 0.7 10*3/uL (ref 0.1–0.9)
Monocytes: 7 %
NEUTROS ABS: 6 10*3/uL (ref 1.4–7.0)
Neutrophils: 67 %
PHOSPHORUS: 4.2 mg/dL (ref 2.5–4.5)
PLATELETS: 422 10*3/uL — AB (ref 150–379)
POTASSIUM: 4.4 mmol/L (ref 3.5–5.2)
RBC: 5.35 x10E6/uL — AB (ref 3.77–5.28)
RDW: 13.8 % (ref 12.3–15.4)
Sodium: 136 mmol/L (ref 134–144)
T3 UPTAKE RATIO: 24 % (ref 24–39)
T4 TOTAL: 8.2 ug/dL (ref 4.5–12.0)
TOTAL PROTEIN: 7.2 g/dL (ref 6.0–8.5)
TRIGLYCERIDES: 172 mg/dL — AB (ref 0–149)
TSH: 1.39 u[IU]/mL (ref 0.450–4.500)
Uric Acid: 7.3 mg/dL — ABNORMAL HIGH (ref 2.5–7.1)
VLDL Cholesterol Cal: 34 mg/dL (ref 5–40)
WBC: 8.9 10*3/uL (ref 3.4–10.8)

## 2017-07-31 LAB — HGB A1C W/O EAG: HEMOGLOBIN A1C: 6.3 % — AB (ref 4.8–5.6)

## 2017-08-01 NOTE — Progress Notes (Signed)
Results reviewed with pt. A1c and glucose elevated, similar to previous. ALT elevated, similar to previous. GGT, trigs, LDL all newly elevated. Pt recently completed course of prednisone. Could be contributing to some elevations. Platelets elevated, worse than previous, RBC newly elevated. Avoid dehydration. Discussed diet and exercise recommendations r/t A1c, lipids. Routine f/u with pcp scheduled. Labs drawn in prep for annual exam. Copy of labs provided to pt. Results routed to pcp per pt request. No further questions/concerns.

## 2017-08-08 NOTE — Progress Notes (Signed)
Pt reported via email that her MD office stated at her visit yesterday they did not receive lab results. Routed 2nd time per pt request to Merri Brunetteandace Smith, MD, fax # in epic [phone].

## 2017-09-19 ENCOUNTER — Ambulatory Visit: Payer: Self-pay | Admitting: Registered Nurse

## 2017-09-19 VITALS — BP 104/80 | HR 88 | Temp 98.9°F

## 2017-09-19 DIAGNOSIS — E119 Type 2 diabetes mellitus without complications: Secondary | ICD-10-CM

## 2017-09-19 DIAGNOSIS — L84 Corns and callosities: Secondary | ICD-10-CM

## 2017-09-19 DIAGNOSIS — S90811A Abrasion, right foot, initial encounter: Secondary | ICD-10-CM

## 2017-09-19 DIAGNOSIS — L089 Local infection of the skin and subcutaneous tissue, unspecified: Secondary | ICD-10-CM

## 2017-09-19 DIAGNOSIS — K1370 Unspecified lesions of oral mucosa: Secondary | ICD-10-CM

## 2017-09-19 LAB — GLUCOSE, POCT (MANUAL RESULT ENTRY): POC GLUCOSE: 87 mg/dL (ref 70–99)

## 2017-09-19 MED ORDER — IBUPROFEN 800 MG PO TABS: 800.0000 mg | ORAL_TABLET | Freq: Three times a day (TID) | ORAL | 0 refills | Status: AC | PRN

## 2017-09-19 MED ORDER — CEPHALEXIN 500 MG PO CAPS: 500.0000 mg | ORAL_CAPSULE | Freq: Two times a day (BID) | ORAL | 0 refills | Status: AC

## 2017-09-19 MED ORDER — FLUTICASONE PROPIONATE 50 MCG/ACT NA SUSP: 1.0000 | Freq: Two times a day (BID) | NASAL | 6 refills | Status: DC

## 2017-09-19 MED ORDER — LORATADINE 10 MG PO TABS: 10.0000 mg | ORAL_TABLET | Freq: Every day | ORAL | 3 refills | Status: DC

## 2017-09-19 NOTE — Progress Notes (Signed)
Subjective:    Patient ID: Christine Rojas, female    DOB: Jun 01, 1966, 52 y.o.   MRN: [id number]  51y/o caucasian female established Pt reports a "corn" to L great toe that has been present for years but has been bothering her recently and becoming more painful in last couple of weeks.  Package for corn remover stated if diabetic do not use.  Patient wondering what she should do.  Recently bought new shoes and this has worsened her pain.  Has tried bunion stickers without relief. Also reports having a pedicure 5 days ago and R heel was cut 3 times by equipment. She states wounds were red, swollen and draining yellow stuff earlier this week. Soaked foot in saltwater last night. Did not notice any drainage this morning and redness was improved. But states walking on it usually causes drainage to worsen. Heel painful, TTP. Pt diabetic. Pt does not check CBGs regularly. Last check was 3-4 weeks ago, 120's fasting, avg for her. CBG today 87 non-fasting. Lunch 1 hour ago. C/o raw spot to soft palate just behind front teeth that she noticed a couple of days ago. Does not recall any food that may have caused it.       Review of Systems  Constitutional: Negative for activity change, appetite change, chills, diaphoresis, fatigue, fever and unexpected weight change.  HENT: Positive for mouth sores. Negative for congestion, dental problem, drooling, ear discharge, ear pain, facial swelling, hearing loss, nosebleeds, postnasal drip, rhinorrhea, sinus pressure, sinus pain, sneezing, sore throat, tinnitus, trouble swallowing and voice change.   Eyes: Negative for photophobia, pain, discharge, redness, itching and visual disturbance.  Respiratory: Negative for cough, choking, chest tightness, shortness of breath, wheezing and stridor.   Cardiovascular: Negative for chest pain, palpitations and leg swelling.  Gastrointestinal: Negative for abdominal distention, abdominal pain, blood in stool, constipation, diarrhea,  nausea and vomiting.  Endocrine: Negative for cold intolerance and heat intolerance.  Genitourinary: Negative for difficulty urinating, dysuria and hematuria.  Musculoskeletal: Positive for myalgias. Negative for arthralgias, back pain, gait problem, joint swelling, neck pain and neck stiffness.  Skin: Positive for color change and wound. Negative for pallor and rash.  Allergic/Immunologic: Positive for environmental allergies. Negative for food allergies.  Neurological: Negative for dizziness, tremors, seizures, syncope, facial asymmetry, speech difficulty, weakness, light-headedness and numbness.  Hematological: Negative for adenopathy. Does not bruise/bleed easily.  Psychiatric/Behavioral: Negative for agitation, behavioral problems, confusion and sleep disturbance.       Objective:   Physical Exam  Constitutional: She is oriented to person, place, and time. Vital signs are normal. She appears well-developed and well-nourished. She is active and cooperative.  Non-toxic appearance. She does not have a sickly appearance. She does not appear ill. No distress.  HENT:  Head: Normocephalic and atraumatic.  Right Ear: Hearing, external ear and ear canal normal. A middle ear effusion is present.  Left Ear: Hearing, external ear and ear canal normal. A middle ear effusion is present.  Nose: Nose normal. No mucosal edema, rhinorrhea, nose lacerations, sinus tenderness, nasal deformity, septal deviation or nasal septal hematoma. No epistaxis.  No foreign bodies. Right sinus exhibits no maxillary sinus tenderness and no frontal sinus tenderness. Left sinus exhibits no maxillary sinus tenderness and no frontal sinus tenderness.  Mouth/Throat: Uvula is midline and mucous membranes are normal. Mucous membranes are not pale, not dry and not cyanotic. She does not have dentures. Oral lesions present. No trismus in the jaw. Normal dentition. No dental abscesses, uvula  swelling, lacerations or dental caries. No  oropharyngeal exudate, posterior oropharyngeal edema, posterior oropharyngeal erythema or tonsillar abscesses.    Bilateral allergic shiners;   Eyes: Conjunctivae, EOM and lids are normal. Pupils are equal, round, and reactive to light. Right eye exhibits no chemosis, no discharge, no exudate and no hordeolum. No foreign body present in the right eye. Left eye exhibits no chemosis, no discharge, no exudate and no hordeolum. No foreign body present in the left eye. Right conjunctiva is not injected. Right conjunctiva has no hemorrhage. Left conjunctiva is not injected. Left conjunctiva has no hemorrhage. No scleral icterus. Right eye exhibits normal extraocular motion and no nystagmus. Left eye exhibits normal extraocular motion and no nystagmus. Right pupil is round and reactive. Left pupil is round and reactive. Pupils are equal.  Neck: Trachea normal, normal range of motion and phonation normal. Neck supple. No tracheal tenderness and no muscular tenderness present. No neck rigidity. No tracheal deviation, no edema, no erythema and normal range of motion present. No thyroid mass and no thyromegaly present.  Cardiovascular: Normal rate, regular rhythm, S1 normal, S2 normal, normal heart sounds and intact distal pulses. PMI is not displaced. Exam reveals no gallop and no friction rub.  No murmur heard. Pulses:      Dorsalis pedis pulses are 2+ on the right side, and 2+ on the left side.       Posterior tibial pulses are 2+ on the right side, and 2+ on the left side.  Pulmonary/Chest: Effort normal and breath sounds normal. No accessory muscle usage or stridor. No respiratory distress. She has no decreased breath sounds. She has no wheezes. She has no rhonchi. She has no rales. She exhibits no tenderness.  No cough in exam room; spoke full sentences without difficulty  Abdominal: Soft. Normal appearance. She exhibits no distension, no fluid wave and no ascites. There is no rigidity and no guarding.   Musculoskeletal: Normal range of motion. She exhibits edema and tenderness. She exhibits no deformity.       Right shoulder: Normal.       Left shoulder: Normal.       Right elbow: Normal.      Left elbow: Normal.       Right hip: Normal.       Left hip: Normal.       Right knee: Normal.       Left knee: Normal.       Right ankle: Normal.       Left ankle: Normal.       Cervical back: Normal.       Thoracic back: Normal.       Lumbar back: Normal.       Right hand: Normal.       Left hand: Normal.       Right foot: There is tenderness and swelling. There is normal range of motion, no bony tenderness, normal capillary refill, no crepitus, no deformity and no laceration.       Left foot: There is tenderness. There is normal range of motion, no bony tenderness, no swelling, normal capillary refill, no crepitus, no deformity and no laceration.       Feet:  Lymphadenopathy:       Head (right side): No submental, no submandibular, no tonsillar, no preauricular, no posterior auricular and no occipital adenopathy present.       Head (left side): No submental, no submandibular, no tonsillar, no preauricular, no posterior auricular and no occipital adenopathy  present.    She has no cervical adenopathy.       Right cervical: No superficial cervical, no deep cervical and no posterior cervical adenopathy present.      Left cervical: No superficial cervical, no deep cervical and no posterior cervical adenopathy present.  Neurological: She is alert and oriented to person, place, and time. She has normal strength. She is not disoriented. She displays no atrophy and no tremor. No cranial nerve deficit or sensory deficit. She exhibits normal muscle tone. She displays no seizure activity. Coordination and gait normal. GCS eye subscore is 4. GCS verbal subscore is 5. GCS motor subscore is 6.  Wearing closed toe sneakers lace up; gait sure and steady in hallway no limp; on/off exam table/in/out of chair  without difficulty; normal sensation  Skin: Skin is warm and dry. Abrasion and rash noted. No bruising, no burn, no ecchymosis, no laceration, no lesion, no petechiae and no purpura noted. Rash is macular. Rash is not papular, not maculopapular, not nodular, not pustular, not vesicular and not urticarial. She is not diaphoretic. There is erythema. No cyanosis. No pallor. Nails show no clubbing.     Bilateral plantar surfaces feet dry scaley with callous noted toes/calcanous and PIP/DIP/MTP joints  Psychiatric: She has a normal mood and affect. Her speech is normal and behavior is normal. Judgment and thought content normal. Cognition and memory are normal.  Nursing note and vitals reviewed.         Assessment & Plan:  A-Abrasion right foot cellulitis, left great toe hyperkeratosis, diabetes, obesity, mouth lesion  P-Keflex 500mg  po BID x 7 days #30 RF0 dispensed from [facility].  Triple antibiotic ointment apply BID after washing right  foot with soap and water given 4 UD from clinic stock.  Apply bandaid given nonstock telfa from clinic stock x 3.  Keflex may increase metformin levels and decrease blood sugars increase snacks while taking keflex.    continue salt water soaks then apply emollient to feet BID  Exitcare handout on skin infection given to patient.  RTC if worsening erythema, pain, purulent discharge, fever.  Wash towels, washcloths, sheets in hot water with bleach every couple of days until infection resolved.  Patient verbalized understanding, agreed with plan of care and had no further questions at this time.    Discussed moleskin use and application for corn left big toe and check footwear not too tight/pinching toes.  Loosen laces to see if less pain with new shoes.  Given handout on corn care from epodiatry. Follow up if no improvement or worsening with plan of care.  Due to diabetes patient to inspect feet daily, do not walk barefoot and apply emollient daily. Patient verbalized  understanding information/instructions, agreed with plan of care and had no further questions at this time.  listerine mouthwash twice a day morning/night.  Eat fruits/vegetables/lean protein daily along with starch each meal.  Discussed probable crunchy food or hot food damaged epithelial layer mouth hard palate and should heal within the next week if no further irritation by frozen/hot foods/crunchy/sharp foods such as tortilla chips/bone in protein/soups.  If no resolution/improvement 7 days follow up for re-evaluation sooner if worsening swelling, dysphagia/dysphasia, purulent discharge,and/or dyspnea.  Patient verbalized understanding information/instructions, agreed with plan of care had no further questions at this time.

## 2017-09-19 NOTE — Patient Instructions (Addendum)

## 2017-11-26 ENCOUNTER — Telehealth: Payer: Self-pay | Admitting: *Deleted

## 2017-11-26 NOTE — Telephone Encounter (Signed)
Pt presented to clinic reporting a blister to L great toe since last week. Saw pcp.Taken out of work 3 days. Told to not wear shoes for 2 weeks. Pt began wearing shoes again to return to work. Pt popped the blister herself at home over the weekend approx 3-4 days ago.Doing epsom salt soaks. Wants to make sure it is healing well now as the skin is darker where it is healing over.  Site looks to be healing well. No signs of infection. New skin covering what was open blistered area per picture pt presents on phone. Pt given triple abx ointment to apply to site. Do not pick at healing area. Continue epsom salt soaks.  Pt also requests a tick bite site to be looked at. Sts tick was removed 2 days ago. Site it now itchy, with surrounding erythema of approx 1.5cm circumference. No bullseye rash noted. Denies systemic sx such as fatigue, joint pain, HA, fever, etc. NP still present from earlier clinic. Doxycycline  po take 2 tabs now #20 RF0 dispensed from [facility] stock. Return precautions of above systemic sx reviewed with pt. Pt verbalizes understanding and agreement and no further questions/concerns.

## 2017-11-27 NOTE — Telephone Encounter (Signed)
Discussed with patient to avoid scratching or removing skin from blister area.  BID topical triple antibiotic generic after washing with soap and water until fully healed 7-14 days.  Ensure shoes/socks not rubbing affected area-consider covering with bandaid or telfa.  Continue epsom salt hot soaks 15 minutes at least daily if not BID.  Monitor for signs and symptoms of infection e.g. Purulent discharge, swelling, red streaks, hot to touch, worsening pain.  Follow up for re-evaluation if these occur.  Slight macular erythema/hyperpigmentation left lateral first toe at PIP joint to nail fold/cuticle.  No edema no discharge.  Tick bite area did not appear to have any remaining tick body present papules surrounded by macular erythema no fluctuance.  Dispensed doxycycline  take 2 tabs now and start  po BID x 10 days if headache/myalgias/joint aches/spreading rash.  Discussed with patient no systemic symptoms and prophylactic dosing per IDSA guidelines.  Encouraged patient to wear sun protective clothing or sunscreen for the next week at risk for sunburn with doxycycline use.  Monitor for any systemic symptoms lyme/rocky [place] spotted fever common in our area.  Follow up if these symptoms occur e.g. Bullseye/body wide rash/myalgias/fatigue/headache/arthralgias.  Patient verbalized understanding information/instructions, agreed with plan of care and had no further questions at this time.

## 2017-12-05 ENCOUNTER — Ambulatory Visit: Payer: Self-pay | Admitting: Registered Nurse

## 2017-12-05 VITALS — BP 107/92 | HR 92 | Temp 98.9°F

## 2017-12-05 DIAGNOSIS — J069 Acute upper respiratory infection, unspecified: Secondary | ICD-10-CM

## 2017-12-05 DIAGNOSIS — B9789 Other viral agents as the cause of diseases classified elsewhere: Principal | ICD-10-CM

## 2017-12-05 NOTE — Progress Notes (Addendum)
Subjective:    Patient ID: Christine Rojas, female    DOB: 04/14/1966, 52 y.o.   MRN: [id number]  52 y/o Established Caucasian female pt c/o productive cough and chest congestion x3 days.  Has noticed some wheezing at night with cough.   Frequent throat clearing sensation. Denies sinus pain/pressure, otalgia, HA. Recent sick contacts with similar sx. Stopped taking her seasonal allergy medication when symptoms decreased.  Still has albuterol MDI, claritin, flonase and nasal saline at home.  Has been taking OTC cough and cold medicine to help with runny nose/cough helps some but doesn't resolve symptoms     Review of Systems  Constitutional: Negative for activity change, appetite change, chills, diaphoresis, fatigue, fever and unexpected weight change.  HENT: Positive for congestion, postnasal drip and rhinorrhea. Negative for dental problem, drooling, ear discharge, ear pain, facial swelling, hearing loss, mouth sores, nosebleeds, sinus pressure, sinus pain, sneezing, sore throat, tinnitus, trouble swallowing and voice change.   Eyes: Negative for photophobia, pain, discharge, redness, itching and visual disturbance.  Respiratory: Positive for cough and wheezing. Negative for choking, chest tightness, shortness of breath and stridor.   Cardiovascular: Negative for chest pain, palpitations and leg swelling.  Gastrointestinal: Negative for abdominal distention, abdominal pain, blood in stool, constipation, diarrhea, nausea and vomiting.  Endocrine: Negative for cold intolerance and heat intolerance.  Genitourinary: Negative for difficulty urinating, dysuria and hematuria.  Musculoskeletal: Negative for arthralgias, back pain, gait problem, joint swelling, myalgias, neck pain and neck stiffness.  Skin: Negative for color change, pallor, rash and wound.  Allergic/Immunologic: Positive for environmental allergies. Negative for food allergies.  Neurological: Negative for dizziness, tremors, seizures,  syncope, facial asymmetry, speech difficulty, weakness, light-headedness, numbness and headaches.  Hematological: Negative for adenopathy. Does not bruise/bleed easily.  Psychiatric/Behavioral: Negative for agitation, confusion and sleep disturbance.       Objective:   Physical Exam  Constitutional: She is oriented to person, place, and time. She appears well-developed and well-nourished. She is active and cooperative.  Non-toxic appearance. She does not have a sickly appearance. She appears ill. No distress.  HENT:  Head: Normocephalic and atraumatic.  Right Ear: Hearing, external ear and ear canal normal. A middle ear effusion is present.  Left Ear: Hearing, external ear and ear canal normal. A middle ear effusion is present.  Nose: Mucosal edema and rhinorrhea present. No nose lacerations, sinus tenderness, nasal deformity, septal deviation or nasal septal hematoma. No epistaxis.  No foreign bodies. Right sinus exhibits no maxillary sinus tenderness and no frontal sinus tenderness. Left sinus exhibits no maxillary sinus tenderness and no frontal sinus tenderness.  Mouth/Throat: Uvula is midline and mucous membranes are normal. Mucous membranes are not pale, not dry and not cyanotic. She does not have dentures. No oral lesions. No trismus in the jaw. Normal dentition. No dental abscesses, uvula swelling, lacerations or dental caries. Posterior oropharyngeal edema and posterior oropharyngeal erythema present. No oropharyngeal exudate or tonsillar abscesses.  Bilateral allergic shiners; cobblestoning posterior pharynx; bilateral TMs air fluid level clear; bilateral nasal turbinates edema erythema clear discharge  Eyes: Pupils are equal, round, and reactive to light. Conjunctivae, EOM and lids are normal. Right eye exhibits no chemosis, no discharge, no exudate and no hordeolum. No foreign body present in the right eye. Left eye exhibits no chemosis, no discharge, no exudate and no hordeolum. No  foreign body present in the left eye. Right conjunctiva is not injected. Right conjunctiva has no hemorrhage. Left conjunctiva is not injected. Left conjunctiva  has no hemorrhage. No scleral icterus. Right eye exhibits normal extraocular motion and no nystagmus. Left eye exhibits normal extraocular motion and no nystagmus. Right pupil is round and reactive. Left pupil is round and reactive. Pupils are equal.  Neck: Trachea normal and normal range of motion. Neck supple. No tracheal tenderness, no spinous process tenderness and no muscular tenderness present. No neck rigidity. No tracheal deviation, no edema, no erythema and normal range of motion present. No thyroid mass and no thyromegaly present.  Cardiovascular: Normal rate, regular rhythm, S1 normal, S2 normal, normal heart sounds and intact distal pulses. PMI is not displaced. Exam reveals no gallop and no friction rub.  No murmur heard. Pulmonary/Chest: Effort normal and breath sounds normal. No accessory muscle usage or stridor. No respiratory distress. She has no decreased breath sounds. She has no wheezes. She has no rhonchi. She has no rales. She exhibits no tenderness.  Intermittent nonproductive cough in exam room observed; spoke full sentences without difficulty  Abdominal: Soft. She exhibits no distension.  Musculoskeletal: Normal range of motion. She exhibits no edema or tenderness.       Right shoulder: Normal.       Left shoulder: Normal.       Right hip: Normal.       Left hip: Normal.       Right knee: Normal.       Left knee: Normal.       Cervical back: Normal.       Right hand: Normal.       Left hand: Normal.  Lymphadenopathy:       Head (right side): No submental, no submandibular, no tonsillar, no preauricular, no posterior auricular and no occipital adenopathy present.       Head (left side): No submental, no submandibular, no tonsillar, no preauricular, no posterior auricular and no occipital adenopathy present.    She  has no cervical adenopathy.       Right cervical: No superficial cervical, no deep cervical and no posterior cervical adenopathy present.      Left cervical: No superficial cervical, no deep cervical and no posterior cervical adenopathy present.  Neurological: She is alert and oriented to person, place, and time. She has normal strength. She is not disoriented. She displays no atrophy and no tremor. No cranial nerve deficit or sensory deficit. She exhibits normal muscle tone. She displays no seizure activity. Coordination and gait normal. GCS eye subscore is 4. GCS verbal subscore is 5. GCS motor subscore is 6.  Skin: Skin is warm, dry and intact. No abrasion, no bruising, no burn, no ecchymosis, no laceration, no lesion, no petechiae and no rash noted. She is not diaphoretic. No cyanosis or erythema. No pallor. Nails show no clubbing.  Psychiatric: She has a normal mood and affect. Her speech is normal and behavior is normal. Judgment and thought content normal. Cognition and memory are normal.  Nursing note and vitals reviewed.         Assessment & Plan:  A-Viral uri with cough  P-Reassured patient BBS CTA and SPO2 98%.  Tessalon pearles didn't help in the past; Restart albuterol and flonase/saline may continue otc cough and cold formula.  See RN Rolly Salter tomorrow for repeat spo2/breath sounds to see if improved/worsening consider prednisone taper unless fever/rhonchi then consider antibiotic. Cough lozenges po q2h prn cough has OTC at her desk.  Consider Prednisone taper  (60/50/40/30/20/10mg ) po daily with breakfast #21 RF0 if worsening symptoms or no improvement with restarting  albuterol use at least am/pm on schedule and then prn use chest tightness/wheezing.  Discussed possible side effects increased/decreased appetite, difficulty sleeping, increased blood sugar, increased blood pressure and heart rate.  Albuterol MDI 1-2 puffs po q4-6h prn protracted cough/wheeze at home side effect  increased heart rate. Bronchitis simple, community acquired, may have started as viral (probably respiratory syncytial, parainfluenza, influenza, or adenovirus), but now evidence of acute purulent bronchitis with resultant bronchial edema and mucus formation.  Viruses are the most common cause of bronchial inflammation in otherwise healthy adults with acute bronchitis.  The appearance of sputum is not predictive of whether a bacterial infection is present.  Purulent sputum is most often caused by viral infections.  There are a small portion of those caused by non-viral agents being Mycoplama pneumonia.  Microscopic examination or C&S of sputum in the healthy adult with acute bronchitis is generally not helpful (usually negative or normal respiratory flora) other considerations being cough from upper respiratory tract infections, sinusitis or allergic syndromes (mild asthma or viral pneumonia).  Differential Diagnoses:  reactive airway disease (asthma, allergic aspergillosis (eosinophilia), chronic bronchitis, respiratory infection (sinusitis, common cold, pneumonia), congestive heart failure, reflux esophagitis, bronchogenic tumor, aspiration syndromes and/or exposure to pulmonary irritants/smoke. Without high fever, severe dyspnea, lack of physical findings or other risk factors, I will hold on a chest radiograph and CBC at this time.  I discussed that approximately 50% of patients with acute bronchitis have a cough that lasts up to three weeks, and 25% for over a month.  Tylenol  one to two tablets every four to six hours as needed for fever or myalgias.  No aspirin. Exitcare handout on viral uri, sinus rinse, bronchitis and inhaler use given to patient.  ER if hemopthysis, SOB, worst chest pain of life.   Patient instructed to follow up tomorrow for re-evaluation with RN Rolly Salter to check SPO2 and breath sounds and then prn if new or worsening/unresolved symptoms next week;sooner if symptoms worsen.  Patient  verbalized agreement and understanding of treatment plan.  P2:  hand washing and cover cough  Patient may use normal saline nasal spray 2 sprays each nostril q2h wa as needed given 1 bottle from clinic stock.  restart flonase 1 spray each nostril BID at home.  Patient denied personal or family history of ENT cancer.  OTC antihistamine of choice restart claritin  po daily.  Avoid triggers if possible.  Shower prior to bedtime if exposed to triggers.  If allergic dust/dust mites recommend mattress/pillow covers/encasements; washing linens, vacuuming, sweeping, dusting weekly.  Call or return to clinic as needed if these symptoms worsen or fail to improve as anticipated.   Exitcare handout on sinus rinse.  Patient verbalized understanding of instructions, agreed with plan of care and had no further questions at this time.  P2:  Avoidance and hand washing.

## 2017-12-06 MED ORDER — SALINE SPRAY 0.65 % NA SOLN: 2.0000 | NASAL | 0 refills | Status: DC

## 2017-12-06 MED ORDER — FLUTICASONE PROPIONATE 50 MCG/ACT NA SUSP: 1.0000 | Freq: Two times a day (BID) | NASAL | 1 refills | Status: DC

## 2017-12-06 MED ORDER — LORATADINE 10 MG PO TABS: 10.0000 mg | ORAL_TABLET | Freq: Every day | ORAL | 0 refills | Status: DC

## 2017-12-06 MED ORDER — ALBUTEROL SULFATE HFA 108 (90 BASE) MCG/ACT IN AERS: 1.0000 | INHALATION_SPRAY | RESPIRATORY_TRACT | 0 refills | Status: DC | PRN

## 2017-12-06 NOTE — Patient Instructions (Signed)
Sinus Rinse What is a sinus rinse? A sinus rinse is a simple home treatment that is used to rinse your sinuses with a sterile mixture of salt and water (saline solution). Sinuses are air-filled spaces in your skull behind the bones of your face and forehead that open into your nasal cavity. You will use the following:  Saline solution.  Neti pot or spray bottle. This releases the saline solution into your nose and through your sinuses. Neti pots and spray bottles can be purchased at [employer], a health food store, or online.  When would I do a sinus rinse? A sinus rinse can help to clear mucus, dirt, dust, or pollen from the nasal cavity. You may do a sinus rinse when you have a cold, a virus, nasal allergy symptoms, a sinus infection, or stuffiness in the nose or sinuses. If you are considering a sinus rinse:  Ask your child's health care provider before performing a sinus rinse on your child.  Do not do a sinus rinse if you have had ear or nasal surgery, ear infection, or blocked ears.  How do I do a sinus rinse?  Wash your hands.  Disinfect your device according to the directions provided and then dry it.  Use the solution that comes with your device or one that is sold separately in stores. Follow the mixing directions on the package.  Fill your device with the amount of saline solution as directed by the device instructions.  Stand over a sink and tilt your head sideways over the sink.  Place the spout of the device in your upper nostril (the one closer to the ceiling).  Gently pour or squeeze the saline solution into the nasal cavity. The liquid should drain to the lower nostril if you are not overly congested.  Gently blow your nose. Blowing too hard may cause ear pain.  Repeat in the other nostril.  Clean and rinse your device with clean water and then air-dry it. Are there risks of a sinus rinse? Sinus rinse is generally very safe and effective. However,  there are a few risks, which include:  A burning sensation in the sinuses. This may happen if you do not make the saline solution as directed. Make sure to follow all directions when making the saline solution.  Infection from contaminated water. This is rare, but possible.  Nasal irritation.  This information is not intended to replace advice given to you by your health care provider. Make sure you discuss any questions you have with your health care provider. Document Released: 01/20/2014 Document Revised: 05/22/2016 Document Reviewed: 11/10/2013 Elsevier Interactive Patient Education  2017 [employer]. How to Use a Metered Dose Inhaler A metered dose inhaler is a handheld device for taking medicine that must be breathed into the lungs (inhaled). The device can be used to deliver a variety of inhaled medicines, including:  Quick relief or rescue medicines, such as bronchodilators.  Controller medicines, such as corticosteroids.  The medicine is delivered by pushing down on a metal canister to release a preset amount of spray and medicine. Each device contains the amount of medicine that is needed for a preset number of uses (inhalations). Your health care provider may recommend that you use a spacer with your inhaler to help you take the medicine more effectively. A spacer is a plastic tube with a mouthpiece on one end and an opening that connects to the inhaler on the other end. A spacer holds the medicine in  a tube for a short time, which allows you to inhale more medicine. What are the risks? If you do not use your inhaler correctly, medicine might not reach your lungs to help you breathe. Inhaler medicine can cause side effects, such as:  Mouth or throat infection.  Cough.  Hoarseness.  Headache.  Nausea and vomiting.  Lung infection (pneumonia) in people who have a lung condition called COPD.  How to use a metered dose inhaler without a spacer 1. Remove the cap from the  inhaler. 2. If you are using the inhaler for the first time, shake it for 5 seconds, turn it away from your face, then release 4 puffs into the air. This is called priming. 3. Shake the inhaler for 5 seconds. 4. Position the inhaler so the top of the canister faces up. 5. Put your index finger on the top of the medicine canister. Support the bottom of the inhaler with your thumb. 6. Breathe out normally and as completely as possible, away from the inhaler. 7. Either place the inhaler between your teeth and close your lips tightly around the mouthpiece, or hold the inhaler 1-2 inches (2.5-5 cm) away from your open mouth. Keep your tongue down out of the way. If you are unsure which technique to use, ask your health care provider. 8. Press the canister down with your index finger to release the medicine, then inhale deeply and slowly through your mouth (not your nose) until your lungs are completely filled. Inhaling should take 4-6 seconds. 9. Hold the medicine in your lungs for 5-10 seconds (10 seconds is best). This helps the medicine get into the small airways of your lungs. 10. With your lips in a tight circle (pursed), breathe out slowly. 11. Repeat steps 3-10 until you have taken the number of puffs that your health care provider directed. Wait about 1 minute between puffs or as directed. 12. Put the cap on the inhaler. 13. If you are using a steroid inhaler, rinse your mouth with water, gargle, and spit out the water. Do not swallow the water. How to use a metered dose inhaler with a spacer 1. Remove the cap from the inhaler. 2. If you are using the inhaler for the first time, shake it for 5 seconds, turn it away from your face, then release 4 puffs into the air. This is called priming. 3. Shake the inhaler for 5 seconds. 4. Place the open end of the spacer onto the inhaler mouthpiece. 5. Position the inhaler so the top of the canister faces up and the spacer mouthpiece faces you. 6. Put your  index finger on the top of the medicine canister. Support the bottom of the inhaler and the spacer with your thumb. 7. Breathe out normally and as completely as possible, away from the spacer. 8. Place the spacer between your teeth and close your lips tightly around it. Keep your tongue down out of the way. 9. Press the canister down with your index finger to release the medicine, then inhale deeply and slowly through your mouth (not your nose) until your lungs are completely filled. Inhaling should take 4-6 seconds. 10. Hold the medicine in your lungs for 5-10 seconds (10 seconds is best). This helps the medicine get into the small airways of your lungs. 11. With your lips in a tight circle (pursed), breathe out slowly. 12. Repeat steps 3-11 until you have taken the number of puffs that your health care provider directed. Wait about 1 minute between  puffs or as directed. 13. Remove the spacer from the inhaler and put the cap on the inhaler. 14. If you are using a steroid inhaler, rinse your mouth with water, gargle, and spit out the water. Do not swallow the water. Follow these instructions at home:  Take your inhaled medicine only as told by your health care provider. Do not use the inhaler more than directed by your health care provider.  Keep all follow-up visits as told by your health care provider. This is important.  If your inhaler has a counter, you can check it to determine how full your inhaler is. If your inhaler does not have a counter, ask your health care provider when you will need to refill your inhaler and write the refill date on a calendar or on your inhaler canister. Note that you cannot know when an inhaler is empty by shaking it.  Follow directions on the package insert for care and cleaning of your inhaler and spacer. Contact a health care provider if:  Symptoms are only partially relieved with your inhaler.  You are having trouble using your inhaler.  You have an  increase in phlegm.  You have headaches. Get help right away if:  You feel little or no relief after using your inhaler.  You have dizziness.  You have a fast heart rate.  You have chills or a fever.  You have night sweats.  There is blood in your phlegm. Summary  A metered dose inhaler is a handheld device for taking medicine that must be breathed into the lungs (inhaled).  The medicine is delivered by pushing down on a metal canister to release a preset amount of spray and medicine.  Each device contains the amount of medicine that is needed for a preset number of uses (inhalations). This information is not intended to replace advice given to you by your health care provider. Make sure you discuss any questions you have with your health care provider. Document Released: 06/25/2005 Document Revised: 05/15/2016 Document Reviewed: 05/15/2016 Elsevier Interactive Patient Education  2017 [employer] Acute Bronchitis, Adult Acute bronchitis is sudden (acute) swelling of the air tubes (bronchi) in the lungs. Acute bronchitis causes these tubes to fill with mucus, which can make it hard to breathe. It can also cause coughing or wheezing. In adults, acute bronchitis usually goes away within 2 weeks. A cough caused by bronchitis may last up to 3 weeks. Smoking, allergies, and asthma can make the condition worse. Repeated episodes of bronchitis may cause further lung problems, such as chronic obstructive pulmonary disease (COPD). What are the causes? This condition can be caused by germs and by substances that irritate the lungs, including:  Cold and flu viruses. This condition is most often caused by the same virus that causes a cold.  Bacteria.  Exposure to tobacco smoke, dust, fumes, and air pollution.  What increases the risk? This condition is more likely to develop in people who:  Have close contact with someone with acute bronchitis.  Are exposed to lung irritants, such as  tobacco smoke, dust, fumes, and vapors.  Have a weak immune system.  Have a respiratory condition such as asthma.  What are the signs or symptoms? Symptoms of this condition include:  A cough.  Coughing up clear, yellow, or green mucus.  Wheezing.  Chest congestion.  Shortness of breath.  A fever.  Body aches.  Chills.  A sore throat.  How is this diagnosed? This condition is usually diagnosed with  a physical exam. During the exam, your health care provider may order tests, such as chest X-rays, to rule out other conditions. He or she may also:  Test a sample of your mucus for bacterial infection.  Check the level of oxygen in your blood. This is done to check for pneumonia.  Do a chest X-ray or lung function testing to rule out pneumonia and other conditions.  Perform blood tests.  Your health care provider will also ask about your symptoms and medical history. How is this treated? Most cases of acute bronchitis clear up over time without treatment. Your health care provider may recommend:  Drinking more fluids. Drinking more makes your mucus thinner, which may make it easier to breathe.  Taking a medicine for a fever or cough.  Taking an antibiotic medicine.  Using an inhaler to help improve shortness of breath and to control a cough.  Using a cool mist vaporizer or humidifier to make it easier to breathe.  Follow these instructions at home: Medicines  Take over-the-counter and prescription medicines only as told by your health care provider.  If you were prescribed an antibiotic, take it as told by your health care provider. Do not stop taking the antibiotic even if you start to feel better. General instructions  Get plenty of rest.  Drink enough fluids to keep your urine clear or pale yellow.  Avoid smoking and secondhand smoke. Exposure to cigarette smoke or irritating chemicals will make bronchitis worse. If you smoke and you need help quitting,  ask your health care provider. Quitting smoking will help your lungs heal faster.  Use an inhaler, cool mist vaporizer, or humidifier as told by your health care provider.  Keep all follow-up visits as told by your health care provider. This is important. How is this prevented? To lower your risk of getting this condition again:  Wash your hands often with soap and water. If soap and water are not available, use hand sanitizer.  Avoid contact with people who have cold symptoms.  Try not to touch your hands to your mouth, nose, or eyes.  Make sure to get the flu shot every year.  Contact a health care provider if:  Your symptoms do not improve in 2 weeks of treatment. Get help right away if:  You cough up blood.  You have chest pain.  You have severe shortness of breath.  You become dehydrated.  You faint or keep feeling like you are going to faint.  You keep vomiting.  You have a severe headache.  Your fever or chills gets worse. This information is not intended to replace advice given to you by your health care provider. Make sure you discuss any questions you have with your health care provider. Document Released: 08/02/2004 Document Revised: 01/18/2016 Document Reviewed: 12/14/2015 Elsevier Interactive Patient Education  2018 [employer]. Viral Respiratory Infection A respiratory infection is an illness that affects part of the respiratory system, such as the lungs, nose, or throat. Most respiratory infections are caused by either viruses or bacteria. A respiratory infection that is caused by a virus is called a viral respiratory infection. Common types of viral respiratory infections include:  A cold.  The flu (influenza).  A respiratory syncytial virus (RSV) infection.  How do I know if I have a viral respiratory infection? Most viral respiratory infections cause:  A stuffy or runny nose.  Yellow or green nasal discharge.  A  cough.  Sneezing.  Fatigue.  Achy muscles.  A sore throat.  Sweating or chills.  A fever.  A headache.  How are viral respiratory infections treated? If influenza is diagnosed early, it may be treated with an antiviral medicine that shortens the length of time a person has symptoms. Symptoms of viral respiratory infections may be treated with over-the-counter and prescription medicines, such as:  Expectorants. These make it easier to cough up mucus.  Decongestant nasal sprays.  Health care providers do not prescribe antibiotic medicines for viral infections. This is because antibiotics are designed to kill bacteria. They have no effect on viruses. How do I know if I should stay home from work or school? To avoid exposing others to your respiratory infection, stay home if you have:  A fever.  A persistent cough.  A sore throat.  A runny nose.  Sneezing.  Muscles aches.  Headaches.  Fatigue.  Weakness.  Chills.  Sweating.  Nausea.  Follow these instructions at home:  Rest as much as possible.  Take over-the-counter and prescription medicines only as told by your health care provider.  Drink enough fluid to keep your urine clear or pale yellow. This helps prevent dehydration and helps loosen up mucus.  Gargle with a salt-water mixture 3-4 times per day or as needed. To make a salt-water mixture, completely dissolve -1 tsp of salt in 1 cup of warm water.  Use nose drops made from salt water to ease congestion and soften raw skin around your nose.  Do not drink alcohol.  Do not use tobacco products, including cigarettes, chewing tobacco, and e-cigarettes. If you need help quitting, ask your health care provider. Contact a health care provider if:  Your symptoms last for 10 days or longer.  Your symptoms get worse over time.  You have a fever.  You have severe sinus pain in your face or forehead.  The glands in your jaw or neck become very  swollen. Get help right away if:  You feel pain or pressure in your chest.  You have shortness of breath.  You faint or feel like you will faint.  You have severe and persistent vomiting.  You feel confused or disoriented. This information is not intended to replace advice given to you by your health care provider. Make sure you discuss any questions you have with your health care provider. Document Released: 04/04/2005 Document Revised: 12/01/2015 Document Reviewed: 12/01/2014 Elsevier Interactive Patient Education  [employer].

## 2017-12-12 ENCOUNTER — Ambulatory Visit: Payer: Self-pay | Admitting: Registered Nurse

## 2017-12-12 VITALS — BP 106/77 | HR 76 | Temp 97.7°F

## 2017-12-12 DIAGNOSIS — J209 Acute bronchitis, unspecified: Secondary | ICD-10-CM

## 2017-12-12 MED ORDER — PREDNISONE 10 MG (21) PO TBPK: ORAL_TABLET | ORAL | 0 refills | Status: DC

## 2017-12-12 MED ORDER — DOXYCYCLINE HYCLATE 100 MG PO TABS: 100.0000 mg | ORAL_TABLET | Freq: Two times a day (BID) | ORAL | 0 refills | Status: DC

## 2017-12-12 NOTE — Patient Instructions (Signed)
How to Use a Metered Dose Inhaler A metered dose inhaler is a handheld device for taking medicine that must be breathed into the lungs (inhaled). The device can be used to deliver a variety of inhaled medicines, including:  Quick relief or rescue medicines, such as bronchodilators.  Controller medicines, such as corticosteroids.  The medicine is delivered by pushing down on a metal canister to release a preset amount of spray and medicine. Each device contains the amount of medicine that is needed for a preset number of uses (inhalations). Your health care provider may recommend that you use a spacer with your inhaler to help you take the medicine more effectively. A spacer is a plastic tube with a mouthpiece on one end and an opening that connects to the inhaler on the other end. A spacer holds the medicine in a tube for a short time, which allows you to inhale more medicine. What are the risks? If you do not use your inhaler correctly, medicine might not reach your lungs to help you breathe. Inhaler medicine can cause side effects, such as:  Mouth or throat infection.  Cough.  Hoarseness.  Headache.  Nausea and vomiting.  Lung infection (pneumonia) in people who have a lung condition called COPD.  How to use a metered dose inhaler without a spacer 1. Remove the cap from the inhaler. 2. If you are using the inhaler for the first time, shake it for 5 seconds, turn it away from your face, then release 4 puffs into the air. This is called priming. 3. Shake the inhaler for 5 seconds. 4. Position the inhaler so the top of the canister faces up. 5. Put your index finger on the top of the medicine canister. Support the bottom of the inhaler with your thumb. 6. Breathe out normally and as completely as possible, away from the inhaler. 7. Either place the inhaler between your teeth and close your lips tightly around the mouthpiece, or hold the inhaler 1-2 inches (2.5-5 cm) away from your open  mouth. Keep your tongue down out of the way. If you are unsure which technique to use, ask your health care provider. 8. Press the canister down with your index finger to release the medicine, then inhale deeply and slowly through your mouth (not your nose) until your lungs are completely filled. Inhaling should take 4-6 seconds. 9. Hold the medicine in your lungs for 5-10 seconds (10 seconds is best). This helps the medicine get into the small airways of your lungs. 10. With your lips in a tight circle (pursed), breathe out slowly. 11. Repeat steps 3-10 until you have taken the number of puffs that your health care provider directed. Wait about 1 minute between puffs or as directed. 12. Put the cap on the inhaler. 13. If you are using a steroid inhaler, rinse your mouth with water, gargle, and spit out the water. Do not swallow the water. How to use a metered dose inhaler with a spacer 1. Remove the cap from the inhaler. 2. If you are using the inhaler for the first time, shake it for 5 seconds, turn it away from your face, then release 4 puffs into the air. This is called priming. 3. Shake the inhaler for 5 seconds. 4. Place the open end of the spacer onto the inhaler mouthpiece. 5. Position the inhaler so the top of the canister faces up and the spacer mouthpiece faces you. 6. Put your index finger on the top of the medicine canister.  Support the bottom of the inhaler and the spacer with your thumb. 7. Breathe out normally and as completely as possible, away from the spacer. 8. Place the spacer between your teeth and close your lips tightly around it. Keep your tongue down out of the way. 9. Press the canister down with your index finger to release the medicine, then inhale deeply and slowly through your mouth (not your nose) until your lungs are completely filled. Inhaling should take 4-6 seconds. 10. Hold the medicine in your lungs for 5-10 seconds (10 seconds is best). This helps the medicine  get into the small airways of your lungs. 11. With your lips in a tight circle (pursed), breathe out slowly. 12. Repeat steps 3-11 until you have taken the number of puffs that your health care provider directed. Wait about 1 minute between puffs or as directed. 13. Remove the spacer from the inhaler and put the cap on the inhaler. 14. If you are using a steroid inhaler, rinse your mouth with water, gargle, and spit out the water. Do not swallow the water. Follow these instructions at home:  Take your inhaled medicine only as told by your health care provider. Do not use the inhaler more than directed by your health care provider.  Keep all follow-up visits as told by your health care provider. This is important.  If your inhaler has a counter, you can check it to determine how full your inhaler is. If your inhaler does not have a counter, ask your health care provider when you will need to refill your inhaler and write the refill date on a calendar or on your inhaler canister. Note that you cannot know when an inhaler is empty by shaking it.  Follow directions on the package insert for care and cleaning of your inhaler and spacer. Contact a health care provider if:  Symptoms are only partially relieved with your inhaler.  You are having trouble using your inhaler.  You have an increase in phlegm.  You have headaches. Get help right away if:  You feel little or no relief after using your inhaler.  You have dizziness.  You have a fast heart rate.  You have chills or a fever.  You have night sweats.  There is blood in your phlegm. Summary  A metered dose inhaler is a handheld device for taking medicine that must be breathed into the lungs (inhaled).  The medicine is delivered by pushing down on a metal canister to release a preset amount of spray and medicine.  Each device contains the amount of medicine that is needed for a preset number of uses (inhalations). This  information is not intended to replace advice given to you by your health care provider. Make sure you discuss any questions you have with your health care provider. Document Released: 06/25/2005 Document Revised: 05/15/2016 Document Reviewed: 05/15/2016 Elsevier Interactive Patient Education  2017 [employer] Acute Bronchitis, Adult Acute bronchitis is sudden (acute) swelling of the air tubes (bronchi) in the lungs. Acute bronchitis causes these tubes to fill with mucus, which can make it hard to breathe. It can also cause coughing or wheezing. In adults, acute bronchitis usually goes away within 2 weeks. A cough caused by bronchitis may last up to 3 weeks. Smoking, allergies, and asthma can make the condition worse. Repeated episodes of bronchitis may cause further lung problems, such as chronic obstructive pulmonary disease (COPD). What are the causes? This condition can be caused by germs and by substances  that irritate the lungs, including:  Cold and flu viruses. This condition is most often caused by the same virus that causes a cold.  Bacteria.  Exposure to tobacco smoke, dust, fumes, and air pollution.  What increases the risk? This condition is more likely to develop in people who:  Have close contact with someone with acute bronchitis.  Are exposed to lung irritants, such as tobacco smoke, dust, fumes, and vapors.  Have a weak immune system.  Have a respiratory condition such as asthma.  What are the signs or symptoms? Symptoms of this condition include:  A cough.  Coughing up clear, yellow, or green mucus.  Wheezing.  Chest congestion.  Shortness of breath.  A fever.  Body aches.  Chills.  A sore throat.  How is this diagnosed? This condition is usually diagnosed with a physical exam. During the exam, your health care provider may order tests, such as chest X-rays, to rule out other conditions. He or she may also:  Test a sample of your mucus for  bacterial infection.  Check the level of oxygen in your blood. This is done to check for pneumonia.  Do a chest X-ray or lung function testing to rule out pneumonia and other conditions.  Perform blood tests.  Your health care provider will also ask about your symptoms and medical history. How is this treated? Most cases of acute bronchitis clear up over time without treatment. Your health care provider may recommend:  Drinking more fluids. Drinking more makes your mucus thinner, which may make it easier to breathe.  Taking a medicine for a fever or cough.  Taking an antibiotic medicine.  Using an inhaler to help improve shortness of breath and to control a cough.  Using a cool mist vaporizer or humidifier to make it easier to breathe.  Follow these instructions at home: Medicines  Take over-the-counter and prescription medicines only as told by your health care provider.  If you were prescribed an antibiotic, take it as told by your health care provider. Do not stop taking the antibiotic even if you start to feel better. General instructions  Get plenty of rest.  Drink enough fluids to keep your urine clear or pale yellow.  Avoid smoking and secondhand smoke. Exposure to cigarette smoke or irritating chemicals will make bronchitis worse. If you smoke and you need help quitting, ask your health care provider. Quitting smoking will help your lungs heal faster.  Use an inhaler, cool mist vaporizer, or humidifier as told by your health care provider.  Keep all follow-up visits as told by your health care provider. This is important. How is this prevented? To lower your risk of getting this condition again:  Wash your hands often with soap and water. If soap and water are not available, use hand sanitizer.  Avoid contact with people who have cold symptoms.  Try not to touch your hands to your mouth, nose, or eyes.  Make sure to get the flu shot every year.  Contact a  health care provider if:  Your symptoms do not improve in 2 weeks of treatment. Get help right away if:  You cough up blood.  You have chest pain.  You have severe shortness of breath.  You become dehydrated.  You faint or keep feeling like you are going to faint.  You keep vomiting.  You have a severe headache.  Your fever or chills gets worse. This information is not intended to replace advice given to you by  your health care provider. Make sure you discuss any questions you have with your health care provider. Document Released: 08/02/2004 Document Revised: 01/18/2016 Document Reviewed: 12/14/2015 Elsevier Interactive Patient Education  2018 [employer]  

## 2017-12-12 NOTE — Progress Notes (Signed)
Subjective:    Patient ID: Christine Rojas, female    DOB: October 30, 1965, 52 y.o.   MRN: [id number]  51y/o Caucasian established female pt with persistant productive cough from 5/30 OV. At that visit felt congestion had moved from chest to head, now has moved back to chest. Has rarely used albuterol inhaler since 5/30 visit states she uses it when she feels like she can't breathe or catch her breath. Does report hearing wheezing or high-pitched whistling sound with breathing, so RN Nance PewHaley Workman reinforced with pt to use inhaler when this is heard or chest feels tight when breathing, up to q4h. Still using Flonase/saline. Denied sinus pain/pressure. States ears are popping, no pain. Denies sore throat, fever.  Some mucous coming up clear.  Coughing spells.     Review of Systems  Constitutional: Negative for activity change, appetite change, chills, diaphoresis, fatigue and fever.  HENT: Negative for drooling, ear discharge, ear pain, facial swelling, hearing loss, mouth sores, nosebleeds, postnasal drip, rhinorrhea, sinus pressure, sinus pain, sneezing, sore throat, trouble swallowing and voice change.   Eyes: Negative for photophobia and visual disturbance.  Respiratory: Positive for cough, chest tightness and wheezing. Negative for choking, shortness of breath and stridor.   Gastrointestinal: Negative for diarrhea, nausea and vomiting.  Endocrine: Negative for cold intolerance and heat intolerance.  Genitourinary: Negative for difficulty urinating.  Musculoskeletal: Negative for neck pain and neck stiffness.  Skin: Negative for color change, pallor, rash and wound.  Allergic/Immunologic: Positive for environmental allergies. Negative for food allergies.  Neurological: Negative for dizziness, tremors, seizures, syncope, facial asymmetry, speech difficulty, weakness, light-headedness, numbness and headaches.  Hematological: Negative for adenopathy. Does not bruise/bleed easily.   Psychiatric/Behavioral: Positive for sleep disturbance. Negative for agitation and confusion.       Objective:   Physical Exam  Constitutional: She is oriented to person, place, and time. Vital signs are normal. She appears well-developed and well-nourished. She is active and cooperative.  Non-toxic appearance. She does not have a sickly appearance. She appears ill. No distress.  HENT:  Head: Normocephalic and atraumatic.  Right Ear: Hearing, external ear and ear canal normal. Tympanic membrane is injected. A middle ear effusion is present.  Left Ear: Hearing, external ear and ear canal normal. Tympanic membrane is injected. A middle ear effusion is present.  Nose: Mucosal edema and rhinorrhea present. No nose lacerations, sinus tenderness, nasal deformity, septal deviation or nasal septal hematoma. No epistaxis.  No foreign bodies. Right sinus exhibits no maxillary sinus tenderness and no frontal sinus tenderness. Left sinus exhibits no maxillary sinus tenderness and no frontal sinus tenderness.  Mouth/Throat: Uvula is midline and mucous membranes are normal. Mucous membranes are not pale, not dry and not cyanotic. She does not have dentures. No oral lesions. No trismus in the jaw. Normal dentition. No dental abscesses, uvula swelling, lacerations or dental caries. Posterior oropharyngeal edema and posterior oropharyngeal erythema present. No oropharyngeal exudate or tonsillar abscesses. No tonsillar exudate.  Cobblestoning posterior pharynx; bilateral TMs air fluid level clear; clear discharge bilateral nasal turibinates with edema/erythema; bilateral allergic shiners  Eyes: Pupils are equal, round, and reactive to light. Conjunctivae, EOM and lids are normal. Right eye exhibits no chemosis, no discharge, no exudate and no hordeolum. No foreign body present in the right eye. Left eye exhibits no chemosis, no discharge, no exudate and no hordeolum. No foreign body present in the left eye. Right  conjunctiva is not injected. Right conjunctiva has no hemorrhage. Left conjunctiva is not  injected. Left conjunctiva has no hemorrhage. No scleral icterus. Right eye exhibits normal extraocular motion and no nystagmus. Left eye exhibits normal extraocular motion and no nystagmus. Right pupil is round and reactive. Left pupil is round and reactive. Pupils are equal.  Neck: Trachea normal, normal range of motion and phonation normal. Neck supple. No tracheal tenderness, no spinous process tenderness and no muscular tenderness present. No neck rigidity. No tracheal deviation, no edema, no erythema and normal range of motion present. No thyroid mass and no thyromegaly present.  Cardiovascular: Normal rate, regular rhythm, S1 normal, S2 normal, normal heart sounds and intact distal pulses. PMI is not displaced. Exam reveals no gallop, no distant heart sounds and no friction rub.  No murmur heard. Pulmonary/Chest: Effort normal. No accessory muscle usage or stridor. No respiratory distress. She has no decreased breath sounds. She has no wheezes. She has rhonchi in the right middle field. She has no rales. She exhibits no tenderness.  Intermittent nonproductive cough in exam room; spoke full sentences without difficulty; mild intermittent rhonchi RML with and without cough  Abdominal: Soft. Normal appearance. She exhibits no distension and no fluid wave. There is no rigidity and no guarding.  Musculoskeletal: Normal range of motion. She exhibits no edema or tenderness.       Right shoulder: Normal.       Left shoulder: Normal.       Right elbow: Normal.      Left elbow: Normal.       Right hip: Normal.       Left hip: Normal.       Right knee: Normal.       Left knee: Normal.       Cervical back: Normal.       Thoracic back: Normal.       Lumbar back: Normal.       Right hand: Normal.       Left hand: Normal.  Lymphadenopathy:       Head (right side): No submental, no submandibular, no tonsillar, no  preauricular, no posterior auricular and no occipital adenopathy present.       Head (left side): No submental, no submandibular, no tonsillar, no preauricular, no posterior auricular and no occipital adenopathy present.    She has no cervical adenopathy.       Right cervical: No superficial cervical, no deep cervical and no posterior cervical adenopathy present.      Left cervical: No superficial cervical, no deep cervical and no posterior cervical adenopathy present.  Neurological: She is alert and oriented to person, place, and time. She has normal strength. She is not disoriented. She displays no atrophy and no tremor. No cranial nerve deficit or sensory deficit. She exhibits normal muscle tone. She displays no seizure activity. Coordination and gait normal. GCS eye subscore is 4. GCS verbal subscore is 5. GCS motor subscore is 6.  On/off exam table; in/out of chair without difficulty; gait sure and steady in hallway  Skin: Skin is warm, dry and intact. Capillary refill takes less than 2 seconds. No abrasion, no bruising, no burn, no ecchymosis, no laceration, no lesion, no petechiae and no rash noted. She is not diaphoretic. No cyanosis or erythema. No pallor. Nails show no clubbing.  Psychiatric: She has a normal mood and affect. Her speech is normal and behavior is normal. Judgment and thought content normal. She is not actively hallucinating. Cognition and memory are normal. She is attentive.  Nursing note and vitals reviewed.  Intermittent rhonchi rml; intermittent nonproductive cough in exam room; bilateral allergic shiners; lower eyelid edema 1+/4; cobblestoning posterior pharynx; bilateral TMs air fluid level clear and vascular excoriation right greater than left      Assessment & Plan:  A-acute bronchitis  P-Prednisone 10mg  po taper with breakfast (30mg  x 3 days then 20mg  x 2 days then 10mg  x 2 days ) #21 RFO dispensed from [facility] to patient.  Patient has left over doxycycline from  tick bite at home dispensed earlier this year to start doxycycline 100mg  po BID x10 days #20 RF0  Reinforced using albuterol more often especially with protracted cough.   Discussed to use sunscreen/protective clothing as increased risk of sunburn and most common side effect stomach upset take medication with food. Cough lozenges po q2h prn cough given 8 UD from clinic stock.    Discussed possible side effects prednisone increased/decreased appetite, difficulty sleeping, increased blood sugar, increased blood pressure and heart rate.  Albuterol MDI 1-2 puffs po q4-6h prn protracted cough/wheeze  effect increased heart rate. Bronchitis simple, community acquired, may have started as viral (probably respiratory syncytial, parainfluenza, influenza, or adenovirus), but now evidence of acute purulent bronchitis with resultant bronchial edema and mucus formation.  Viruses are the most common cause of bronchial inflammation in otherwise healthy adults with acute bronchitis.  The appearance of sputum is not predictive of whether a bacterial infection is present.  Purulent sputum is most often caused by viral infections.  There are a small portion of those caused by non-viral agents being Mycoplama pneumonia.  Microscopic examination or C&S of sputum in the healthy adult with acute bronchitis is generally not helpful (usually negative or normal respiratory flora) other considerations being cough from upper respiratory tract infections, sinusitis or allergic syndromes (mild asthma or viral pneumonia).  Differential Diagnoses:  reactive airway disease (asthma, allergic aspergillosis (eosinophilia), chronic bronchitis, respiratory infection (sinusitis, common cold, pneumonia), congestive heart failure, reflux esophagitis, bronchogenic tumor, aspiration syndromes and/or exposure to pulmonary irritants/smoke.  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  Without high fever, severe  dyspnea, lack of physical findings or other risk factors, I will hold on a chest radiograph and CBC at this time.  I discussed that approximately 50% of patients with acute bronchitis have a cough that lasts up to three weeks, and 25% for over a month.  Tylenol 500mg  one to two tablets every four to six hours as needed for fever or myalgias.  No aspirin. Exitcare handout on bronchitis and inhaler use given to patient.  ER if hemopthysis, SOB, worst chest pain of life.   Patient instructed to follow up in one week or sooner if symptoms worsen.  Patient verbalized agreement and understanding of treatment plan.  P2:  hand washing and cover cough reinforced  She didn't know it could help with cough she thought only wheezing use or tightness  Discussed possible side effects medication interactions hypertension/hypokalemia/insomnia/increased or decreased appetite.

## 2018-02-18 ENCOUNTER — Other Ambulatory Visit: Payer: Self-pay | Admitting: Family Medicine

## 2018-02-18 ENCOUNTER — Other Ambulatory Visit (HOSPITAL_COMMUNITY)
Admission: RE | Admit: 2018-02-18 | Discharge: 2018-02-18 | Disposition: A | Discharge status: Home / Self Care | Payer: No Typology Code available for payment source | Source: Ambulatory Visit | Attending: Family Medicine | Admitting: Family Medicine

## 2018-02-18 DIAGNOSIS — Z124 Encounter for screening for malignant neoplasm of cervix: Secondary | ICD-10-CM | POA: Diagnosis present

## 2018-02-19 LAB — CYTOLOGY - PAP
ADEQUACY: ABSENT
DIAGNOSIS: NEGATIVE
HPV (WINDOPATH): NOT DETECTED

## 2018-03-26 ENCOUNTER — Other Ambulatory Visit: Payer: Self-pay | Admitting: Family Medicine

## 2018-03-26 DIAGNOSIS — Z1231 Encounter for screening mammogram for malignant neoplasm of breast: Secondary | ICD-10-CM

## 2018-04-08 ENCOUNTER — Ambulatory Visit: Payer: Self-pay | Admitting: Registered Nurse

## 2018-04-08 VITALS — BP 132/86 | HR 85 | Temp 98.0°F

## 2018-04-08 DIAGNOSIS — M79671 Pain in right foot: Secondary | ICD-10-CM

## 2018-04-08 DIAGNOSIS — M25572 Pain in left ankle and joints of left foot: Secondary | ICD-10-CM

## 2018-04-08 NOTE — Progress Notes (Signed)
Subjective:    Patient ID: Christine Rojas, female    DOB: [birth year], 52 y.o.   MRN: [id number]  52y/o caucasian female established patient brought new Rx to clinic to have metformin, omeprazole, lipitor, hydrochlorothiazide, lisinopril  filled from [facility] formulary/[facility] see paper chart.  Having right heel pain thinks it is a heel spurt and left ankle pain intermittent wondering if she should start compression therapy and would like refill on her motrin 800mg  po TID prn.  Denied swelling/trauma.  Current brooks shoes greater than 4 months old and she is planning to purchase a new pair.  Had bought OTC arch inserts but they didn't help with pain.  Denied swelling/bruising/rash/trauma/clicking/locking/giving out/new exercise program     Review of Systems  Constitutional: Negative for activity change, appetite change, chills, diaphoresis, fatigue and fever.  HENT: Negative for trouble swallowing and voice change.   Eyes: Negative for photophobia and visual disturbance.  Respiratory: Negative for cough and wheezing.   Cardiovascular: Negative for chest pain and leg swelling.  Gastrointestinal: Negative for abdominal distention, abdominal pain, blood in stool, constipation, diarrhea, nausea and vomiting.  Endocrine: Negative for cold intolerance and heat intolerance.  Genitourinary: Negative for difficulty urinating, dysuria and hematuria.  Musculoskeletal: Positive for arthralgias and myalgias. Negative for back pain, gait problem, joint swelling, neck pain and neck stiffness.  Skin: Negative for color change, pallor, rash and wound.  Allergic/Immunologic: Positive for environmental allergies and immunocompromised state. Negative for food allergies.  Neurological: Negative for dizziness, tremors, seizures, syncope, speech difficulty, weakness, light-headedness, numbness and headaches.  Hematological: Negative for adenopathy. Does not bruise/bleed easily.  Psychiatric/Behavioral: Negative for  agitation, confusion and sleep disturbance. The patient is not nervous/anxious.        Objective:   Physical Exam  Constitutional: She is oriented to person, place, and time. Vital signs are normal. She appears well-developed and well-nourished. She is cooperative.  Non-toxic appearance. She does not have a sickly appearance. She does not appear ill. No distress.  HENT:  Head: Normocephalic and atraumatic.  Right Ear: Hearing and external ear normal.  Left Ear: Hearing and external ear normal.  Nose: Nose normal.  Mouth/Throat: Uvula is midline, oropharynx is clear and moist and mucous membranes are normal. No oropharyngeal exudate. No tonsillar exudate.  Eyes: Pupils are equal, round, and reactive to light. Conjunctivae, EOM and lids are normal. Right eye exhibits no discharge. Left eye exhibits no discharge. No scleral icterus.  Neck: Trachea normal, normal range of motion and phonation normal. Neck supple. No tracheal deviation present.  Cardiovascular: Normal rate, regular rhythm, normal heart sounds and intact distal pulses.  Pulses:      Dorsalis pedis pulses are 2+ on the right side, and 2+ on the left side.       Posterior tibial pulses are 2+ on the right side, and 2+ on the left side.  Pulmonary/Chest: Effort normal and breath sounds normal. No stridor. No respiratory distress. She has no decreased breath sounds. She has no wheezes. She has no rhonchi. She has no rales.  Abdominal: Soft. Normal appearance. She exhibits no distension, no fluid wave and no ascites. There is no rigidity and no guarding.  Musculoskeletal: Normal range of motion. She exhibits no edema, tenderness or deformity.       Right shoulder: Normal.       Left shoulder: Normal.       Right elbow: Normal.      Left elbow: Normal.  Right hip: Normal.       Left hip: Normal.       Right knee: Normal.       Left knee: Normal.       Right ankle: Normal.       Left ankle: She exhibits normal range of motion,  no swelling, no ecchymosis, no deformity, no laceration and normal pulse. No tenderness. No lateral malleolus, no medial malleolus, no posterior TFL, no head of 5th metatarsal and no proximal fibula tenderness found. Achilles tendon exhibits no pain and no defect.       Right foot: There is normal range of motion, no tenderness, no bony tenderness, no swelling, normal capillary refill, no crepitus, no deformity and no laceration.       Left foot: Normal. There is normal range of motion, no tenderness, no bony tenderness, no swelling, normal capillary refill, no crepitus, no deformity and no laceration.       Feet:  Wearing cotton no show socks without compression in brooks running shoes with elevated heel; treads worn out right greater than left midfoot; exam normal bilateral feet except for increased right heel temperature compared to left and bilateral lower legs; nontender/no edema/erythema/no ecchymosis or scaling/callous  Lymphadenopathy:       Head (right side): No submental, no submandibular and no preauricular adenopathy present.       Head (left side): No submental, no submandibular and no preauricular adenopathy present.    She has no cervical adenopathy.       Right cervical: No superficial cervical adenopathy present.      Left cervical: No superficial cervical adenopathy present.  Neurological: She is alert and oriented to person, place, and time. She has normal strength. She is not disoriented. She displays no atrophy and no tremor. No cranial nerve deficit or sensory deficit. She exhibits normal muscle tone. She displays no seizure activity. Coordination and gait normal. GCS eye subscore is 4. GCS verbal subscore is 5. GCS motor subscore is 6.  Gait sure and steady in hallway; no limp noted; in/out of chair without difficulty  Skin: Skin is warm, dry and intact. Capillary refill takes less than 2 seconds. No abrasion, no bruising, no burn, no ecchymosis, no laceration, no lesion, no  petechiae and no rash noted. She is not diaphoretic. No cyanosis or erythema. No pallor. Nails show no clubbing.  Psychiatric: She has a normal mood and affect. Her speech is normal and behavior is normal. Judgment and thought content normal. She is not actively hallucinating. Cognition and memory are normal. She is attentive.  Vitals reviewed.    Applied elastic compression ankle sleeve left in clinic from clinic stock; patient reported ankle feels better and no pain with ambulation noted in clinic versus walking to clinic with intermittent pain.     Assessment & Plan:  A-left acute ankle pain, right heel pain  P-Patient was instructed to rest, ice and elevate the left ankle TID.  Wear compression sleeve when walking. Differential diagnoses strain, tarsul tunnel syndrome, tendonitis.   Activity as tolerated and work on ROM exercises. Patient is to take NSAIDS as needed refilled motrin 800mg  po TID prn pain #30 RF0 from [facility].  exitcare handout printed and given on ankle pain and rehab exercises.  Cryotherapy 15 minutes TID prn pain.  Buy new work shoes as current ones worn out tread and can alter positioning of foot/ankle and worsen pain. Follow up with PCM if symptoms persist greater than 4 weeks or worsening  for re-evaluation.  Patient verbalized agreement and understanding of treatment plan and had no further questions at this time.  History right foot pain worsens after long day at work.  Discussed imaging and patient refused at this time. Patient given [facility] handout on heel spur. Discussed stretches and icing and motrin 800mg  po TID prn pain.  Buy new shoes.. Follow up with PCM if no improvement with discussed care for podiatry referral. Start NSAID of choice (taken previously without side effects). Consider new supportive footwear/OTC gel heel inserts.  Cryotherapy 15 minutes TID prn pain.. Patient verbalized understanding of instructions, agreed with plan of care and had no further questions  at this time.  P2: Injury Prevention and Fitness.

## 2018-04-08 NOTE — Patient Instructions (Addendum)
Ankle Exercises Ask your health care provider which exercises are safe for you. Do exercises exactly as told by your health care provider and adjust them as directed. It is normal to feel mild stretching, pulling, tightness, or discomfort as you do these exercises, but you should stop right away if you feel sudden pain or your pain gets worse. Do not begin these exercises until told by your health care provider. Stretching and range of motion exercises These exercises warm up your muscles and joints and improve the movement and flexibility of your ankle. These exercises also help to relieve pain, numbness, and tingling. Exercise A: Dorsiflexion/Plantar Flexion  1. Sit with your ____left______ knee straight or bent. Do not rest your foot on anything. 2. Flex your ___left_______ ankle to tilt the top of your foot toward your shin. 3. Hold this position for _____15_____ seconds. 4. Point your toes downward to tilt the top of your foot away from your shin. 5. Hold this position for _______15___ seconds. Repeat ______3____ times. Complete this exercise __________ times a day. Exercise B: Ankle Alphabet  1. Sit with your ______left____ foot supported at your lower leg. ? Do not rest your foot on anything. ? Make sure your foot has room to move freely. 2. Think of your ____left______ foot as a paintbrush, and move your foot to trace each letter of the alphabet in the air. Keep your hip and knee still while you trace. Make the letters as large as you can without increasing any discomfort. 3. Trace every letter from A to Z. Repeat ____3______ times. Complete this exercise ____2______ times a day. Exercise C: Ankle Dorsiflexion, Passive 1. Sit on a chair that is placed on a non-carpeted surface. 2. Place your ___left_______ foot on the floor, directly under your ___left_______ knee. Extend your ___right_______ leg for support. 3. Keeping your heel down, slide your __left________ foot back toward the  chair until you feel a stretch at your ankle or calf. If you do not feel a stretch, slide your buttocks forward to the edge of the chair. 4. Hold this stretch for ____15______ seconds. Repeat ______3____ times. Complete this stretch ____2______ times a day. Strengthening exercises These exercises build strength and endurance in your ankle. Endurance is the ability to use your muscles for a long time, even after they get tired. Exercise D: Dorsiflexors  1. Secure a rubber exercise band or tube to an object, such as a table leg, that will stay still when the band is pulled. Secure the other end around your __________ foot. 2. Sit on the floor, facing the object with your __left________ leg extended. The band or tube should be slightly tense when your foot is relaxed. 3. Slowly flex your ___left_______ ankle and toes to bring your foot toward you. 4. Hold this position for _______15___ seconds. 5. Slowly return your foot to the starting position, controlling the band as you do that. Repeat ______3____ times. Complete this exercise __2________ times a day. Exercise E: Plantar Flexors  1. Sit on the floor with your ____left______ leg extended. 2. Loop a rubber exercise band or tube around the ball of your ____left______ foot. The ball of your foot is on the walking surface, right under your toes. The band or tube should be slightly tense when your foot is relaxed. 3. Slowly point your toes downward, pushing them away from you. 4. Hold this position for _____15_____ seconds. 5. Slowly release the tension in the band or tube, controlling smoothly until your foot is  back in the starting position. Repeat ____3____ times. Complete this exercise ____2______ times a day. Exercise F: Towel Curls  1. Sit in a chair on a non-carpeted surface, and put your feet on the floor. 2. Place a towel in front of your feet. If told by your health care provider, add __________ to the end of the towel. 3. Keeping your  heel on the floor, put your ___left_______ foot on the towel. 4. Pull the towel toward you by grabbing the towel with your toes and curling them under. Keep your heel on the floor. 5. Let your toes relax. 6. Grab the towel again. Keep going until the towel is completely underneath your foot. Repeat __3________ times. Complete this exercise _____2_____ times a day. Exercise G: Heel Raise ( Plantar Flexors, Standing) 1. Stand with your feet shoulder-width apart. 2. Keep your weight spread evenly over the width of your feet while you rise up on your toes. Use a wall or table to steady yourself, but try not to use it for support. 3. If this exercise is too easy, try these options: ? Shift your weight toward your __left________ leg until you feel challenged. ? If told by your health care provider, lift your uninjured leg off the floor. 4. Hold this position for _____15_____ seconds. Repeat ___3_______ times. Complete this exercise ___2_______ times a day. Exercise H: Tandem Walking 1. Stand with one foot directly in front of the other. 2. Slowly raise your back foot up, lifting your heel before your toes, and place it directly in front of your other foot. 3. Continue to walk in this heel-to-toe way for __left________ or for as long as told by your health care provider. Have a countertop or wall nearby to use if needed to keep your balance, but try not to hold onto anything for support. Repeat ______3____ times. Complete this exercises __2________ times a day. This information is not intended to replace advice given to you by your health care provider. Make sure you discuss any questions you have with your health care provider. Document Released: 05/09/2005 Document Revised: 02/23/2016 Document Reviewed: 03/13/2015 Elsevier Interactive Patient Education  2018 [employer]. Elastic Bandage and RICE What does an elastic bandage do? Elastic bandages come in different shapes and sizes. They generally  provide support to your injury and reduce swelling while you are healing, but they can perform different functions. Your health care provider will help you to decide what is best for your protection, recovery, or rehabilitation following an injury. What are some general tips for using an elastic bandage?  Use the bandage as directed by the maker of the bandage that you are using.  Do not wrap the bandage too tightly. This may cut off the circulation in the arm or leg in the area below the bandage. ? If part of your body beyond the bandage becomes blue, numb, cold, swollen, or is more painful, your bandage is most likely too tight. If this occurs, remove your bandage and reapply it more loosely.  See your health care provider if the bandage seems to be making your problems worse rather than better.  An elastic bandage should be removed and reapplied every 3-4 hours or as directed by your health care provider. What is RICE? The routine care of many injuries includes rest, ice, compression, and elevation (RICE therapy). Rest Rest is required to allow your body to heal. Generally, you can resume your routine activities when you are comfortable and have been given permission by  your health care provider. Ice Icing your injury helps to keep the swelling down and it reduces pain. Do not apply ice directly to your skin.  Put ice in a plastic bag.  Place a towel between your skin and the bag.  Leave the ice on for 20 minutes, 2-3 times per day.  Do this for as long as you are directed by your health care provider. Compression Compression helps to keep swelling down, gives support, and helps with discomfort. Compression may be done with an elastic bandage. Elevation Elevation helps to reduce swelling and it decreases pain. If possible, your injured area should be placed at or above the level of your heart or the center of your chest. When should I seek medical care? You should seek medical care  if:  You have persistent pain and swelling.  Your symptoms are getting worse rather than improving.  These symptoms may indicate that further evaluation or further X-rays are needed. Sometimes, X-rays may not show a small broken bone (fracture) until a number of days later. Make a follow-up appointment with your health care provider. Ask when your X-ray results will be ready. Make sure that you get your X-ray results. When should I seek immediate medical care? You should seek immediate medical care if:  You have a sudden onset of severe pain at or below the area of your injury.  You develop redness or increased swelling around your injury.  You have tingling or numbness at or below the area of your injury that does not improve after you remove the elastic bandage.  This information is not intended to replace advice given to you by your health care provider. Make sure you discuss any questions you have with your health care provider. Document Released: 12/15/2001 Document Revised: 05/21/2016 Document Reviewed: 02/08/2014 Elsevier Interactive Patient Education  2018 [employer] Ankle Pain Many things can cause ankle pain, including an injury to the area and overuse of the ankle.The ankle joint holds your body weight and allows you to move around. Ankle pain can occur on either side or the back of one ankle or both ankles. Ankle pain may be sharp and burning or dull and aching. There may be tenderness, stiffness, redness, or warmth around the ankle. Follow these instructions at home: Activity  Rest your ankle as told by your health care provider. Avoid any activities that cause ankle pain.  Do exercises as told by your health care provider.  Ask your health care provider if you can drive. Using a brace, a bandage, or crutches  If you were given a brace: ? Wear it as told by your health care provider. ? Remove it when you take a bath or a shower. ? Try not to move your ankle very  much, but wiggle your toes from time to time. This helps to prevent swelling.  If you were given an elastic bandage: ? Remove it when you take a bath or a shower. ? Try not to move your ankle very much, but wiggle your toes from time to time. This helps to prevent swelling. ? Adjust the bandage to make it more comfortable if it feels too tight. ? Loosen the bandage if you have numbness or tingling in your foot or if your foot turns cold and blue.  If you have crutches, use them as told by your health care provider. Continue to use them until you can walk without feeling pain in your ankle. Managing pain, stiffness, and swelling  Raise (  elevate) your ankle above the level of your heart while you are sitting or lying down.  If directed, apply ice to the area: ? Put ice in a plastic bag. ? Place a towel between your skin and the bag. ? Leave the ice on for 20 minutes, 2-3 times per day. General instructions  Keep all follow-up visits as told by your health care provider. This is important.  Record this information that may be helpful for you and your health care provider: ? How often you have ankle pain. ? Where the pain is located. ? What the pain feels like.  Take over-the-counter and prescription medicines only as told by your health care provider. Contact a health care provider if:  Your pain gets worse.  Your pain is not relieved with medicines.  You have a fever or chills.  You are having more trouble with walking.  You have new symptoms. Get help right away if:  Your foot, leg, toes, or ankle tingles or becomes numb.  Your foot, leg, toes, or ankle becomes swollen.  Your foot, leg, toes, or ankle turns pale or blue. This information is not intended to replace advice given to you by your health care provider. Make sure you discuss any questions you have with your health care provider. Document Released: 12/13/2009 Document Revised: 02/24/2016 Document Reviewed:  01/25/2015 Elsevier Interactive Patient Education  2018 [employer] Heel Spur A heel spur is a bony growth that forms on the bottom of your heel bone (calcaneus). Heel spurs are common and do not always cause pain. However, heel spurs often cause inflammation in the strong band of tissue that runs underneath the bone of your foot (plantar fascia). When this happens, you may feel pain on the bottom of your foot, near your heel. What are the causes? The cause of heel spurs is not completely understood. They may be caused by pressure on the heel. Or, they may stem from the muscle attachments (tendons) near the spur pulling on the heel. What increases the risk? You may be at risk for a heel spur if you:  Are older than 40.  Are overweight.  Have wear and tear arthritis (osteoarthritis).  Have plantar fascia inflammation.  What are the signs or symptoms? Some people have heel spurs but no symptoms. If you do have symptoms, they may include:  Pain in the bottom of your heel.  Pain that is worse when you first get out of bed.  Pain that gets worse after walking or standing.  How is this diagnosed? Your health care provider may diagnose a heel spur based on your symptoms and a physical exam. You may also have an X-ray of your foot to check for a bony growth coming from the calcaneus. How is this treated? Treatment aims to relieve the pain from the heel spur. This may include:  Stretching exercises.  Losing weight.  Wearing specific shoes, inserts, or orthotics for comfort and support.  Wearing splints at night to properly position your feet.  Taking over-the-counter medicine to relieve pain.  Being treated with high-intensity sound waves to break up the heel spur (extracorporeal shock wave therapy).  Getting steroid injections in your heel to reduce swelling and ease pain.  Having surgery if your heel spur causes long-term (chronic) pain.  Follow these instructions at  home:  Take medicines only as directed by your health care provider.  Ask your health care provider if you should use ice or cold packs on the painful  areas of your heel or foot.  Avoid activities that cause you pain until you recover or as directed by your health care provider.  Stretch before exercising or being physically active.  Wear supportive shoes that fit well as directed by your health care provider. You might need to buy new shoes. Wearing old shoes or shoes that do not fit correctly may not provide the support that you need.  Lose weight if your health care provider thinks you should. This can relieve pressure on your foot that may be causing pain and discomfort. Contact a health care provider if:  Your pain continues or gets worse. This information is not intended to replace advice given to you by your health care provider. Make sure you discuss any questions you have with your health care provider. Document Released: 08/01/2005 Document Revised: 12/01/2015 Document Reviewed: 08/26/2013 Elsevier Interactive Patient Education  [employer].

## 2018-04-28 ENCOUNTER — Encounter: Payer: Self-pay | Admitting: Radiology

## 2018-04-28 ENCOUNTER — Ambulatory Visit
Admission: RE | Admit: 2018-04-28 | Discharge: 2018-04-28 | Disposition: A | Discharge status: Home / Self Care | Payer: PRIVATE HEALTH INSURANCE | Source: Ambulatory Visit | Attending: Family Medicine | Admitting: Family Medicine

## 2018-04-28 DIAGNOSIS — Z1231 Encounter for screening mammogram for malignant neoplasm of breast: Secondary | ICD-10-CM

## 2018-06-24 ENCOUNTER — Ambulatory Visit: Payer: Self-pay | Admitting: Registered Nurse

## 2018-06-24 ENCOUNTER — Encounter: Payer: Self-pay | Admitting: Registered Nurse

## 2018-06-24 VITALS — BP 102/68 | HR 82 | Temp 98.5°F

## 2018-06-24 DIAGNOSIS — Z77098 Contact with and (suspected) exposure to other hazardous, chiefly nonmedicinal, chemicals: Secondary | ICD-10-CM

## 2018-06-24 NOTE — Patient Instructions (Signed)
Chemical Inhalation Injury A chemical inhalation injury is an internal injury, such as lung damage, that results from breathing in fumes of a chemical or harmful substance (toxic agent). Chemical inhalation injuries most often occur:  During fires, when materials that are burned release chemicals into the environment.  During work accidents, when large quantities of toxic chemicals are spilled at factories or industrial sites. Chemical inhalation injuries vary in severity. An injury tends to be more severe:  The more acidic or alkaline the chemical is.  The more concentrated the substance is.  The longer you are exposed to the substance. RISK FACTORS You are at a high risk for a chemical inhalation injury if you:  Are exposed to burning materials.  Work with chemicals, solvents, or cleaners. SIGNS AND SYMPTOMS Symptoms of a chemical inhalation injury may include:  Hoarse voice.  Shortness of breath or trouble breathing.  Chest pain.  Pale or blue skin.  Mucus production.  Cough.  Weakness.  Dizziness or fainting. DIAGNOSIS Most chemical inhalation injuries can be diagnosed with a physical exam and medical history. Tests may be done to check for lung damage. They may include:  A blood oxygen level test.  A chest X-ray.  Pulmonary function tests. There are no tests to identify the specific chemical or substance that caused the injury. TREATMENT  There is no specific treatment for a chemical inhalation injury. Most treatment is directed at improving the ability of the lungs to deliver oxygen to the body. Time is needed for lung tissue to heal. Supportive treatment may include:  Aerosol treatments to decrease swelling in the airways.  Suctioning of the airways to remove excess mucus.  Supplemental oxygen. HOME CARE INSTRUCTIONS  Do not use any tobacco products, including cigarettes, chewing tobacco, or electronic cigarettes. If you need help quitting, ask your  health care provider.  Do not allow yourself to be exposed to any airway irritants, such as cigarette smoke or smoke from a fireplace.  Follow your health care provider's instructions for the use of any inhalers.  Take medicines only as directed by your health care provider.  Keep all follow-up visits as directed by your health care provider. This is important. SEEK MEDICAL CARE IF:  Your symptoms are not improving as your health care provider predicted. SEEK IMMEDIATE MEDICAL CARE IF:  Your symptoms get worse.  You have increasing shortness of breath or wheezing.  Your skin or your lips appear very pale or blue.  You have a persistent cough.  You cough up blood or dark material.  You have chest pain or weakness.  You have a fever.  You faint. This information is not intended to replace advice given to you by your health care provider. Make sure you discuss any questions you have with your health care provider. Document Released: 02/26/2014 Document Reviewed: 02/26/2014 Elsevier Interactive Patient Education  2017 [employer]  
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
## 2018-07-04 ENCOUNTER — Other Ambulatory Visit: Payer: Self-pay | Admitting: Family Medicine

## 2018-07-04 DIAGNOSIS — N6310 Unspecified lump in the right breast, unspecified quadrant: Secondary | ICD-10-CM

## 2018-07-08 ENCOUNTER — Ambulatory Visit
Admission: RE | Admit: 2018-07-08 | Discharge: 2018-07-08 | Disposition: A | Discharge status: Home / Self Care | Payer: PRIVATE HEALTH INSURANCE | Source: Ambulatory Visit | Attending: Family Medicine | Admitting: Family Medicine

## 2018-07-08 DIAGNOSIS — N6310 Unspecified lump in the right breast, unspecified quadrant: Secondary | ICD-10-CM

## 2018-07-24 ENCOUNTER — Ambulatory Visit: Payer: Self-pay | Admitting: Registered Nurse

## 2018-07-24 VITALS — BP 130/92 | HR 89 | Temp 98.0°F

## 2018-07-24 DIAGNOSIS — M545 Low back pain, unspecified: Secondary | ICD-10-CM

## 2018-07-24 MED ORDER — IBUPROFEN 800 MG PO TABS: 800.0000 mg | ORAL_TABLET | Freq: Three times a day (TID) | ORAL | 0 refills | Status: AC | PRN

## 2018-07-24 MED ORDER — CYCLOBENZAPRINE HCL 10 MG PO TABS: 5.0000 mg | ORAL_TABLET | Freq: Every evening | ORAL | 0 refills | Status: AC | PRN

## 2018-07-24 NOTE — Progress Notes (Signed)
Subjective:    Patient ID: Christine Rojas, female    DOB: [birth year], 53 y.o.   MRN: [id number]  52y/o Caucasian established female pt c/o sudden onset low central back pain last night while standing up after bending over. Has had same pain in the past happening in the same way. States it happens about once every 3 years. States muscle relaxers she was given then helped. But threw out the bottle because they were so old  Did not want prednisone, last flexeril years ago reviewed paper record 2012 [facility] [place]  Also wants refill on motrin running low.  Denied loss of bowel bladder control, saddle paresthesias, arm/leg weakness.  Has been stretching, heat.  Slow position changes working best for her.  Everything tightens up when sitting or after sleeping.  Falls asleep okay but position changes cause flare ups in bed.     Review of Systems  Constitutional: Negative for activity change, appetite change, chills, diaphoresis, fatigue and fever.  HENT: Negative for trouble swallowing and voice change.   Eyes: Negative for photophobia and visual disturbance.  Respiratory: Negative for cough, shortness of breath, wheezing and stridor.   Cardiovascular: Negative for leg swelling.  Gastrointestinal: Negative for diarrhea, nausea and vomiting.  Endocrine: Negative for cold intolerance and heat intolerance.  Genitourinary: Negative for difficulty urinating and enuresis.  Musculoskeletal: Positive for back pain and myalgias. Negative for gait problem, joint swelling, neck pain and neck stiffness.  Skin: Negative for color change, pallor, rash and wound.  Allergic/Immunologic: Positive for environmental allergies and immunocompromised state. Negative for food allergies.  Neurological: Negative for tremors, seizures, syncope, facial asymmetry, speech difficulty, weakness, light-headedness, numbness and headaches.  Hematological: Negative for adenopathy. Does not  bruise/bleed easily.  Psychiatric/Behavioral: Negative for agitation, confusion and sleep disturbance.       Objective:   Physical Exam Vitals signs and nursing note reviewed.  Constitutional:      General: She is not in acute distress.    Appearance: Normal appearance. She is well-developed and well-groomed. She is obese. She is not ill-appearing, toxic-appearing or diaphoretic.  HENT:     Head: Normocephalic and atraumatic.     Right Ear: Hearing and external ear normal.     Left Ear: Hearing and external ear normal.     Nose: Nose normal.     Right Sinus: No maxillary sinus tenderness or frontal sinus tenderness.     Left Sinus: No maxillary sinus tenderness or frontal sinus tenderness.     Mouth/Throat:     Pharynx: Uvula midline. No oropharyngeal exudate.  Eyes:     General: Lids are normal. No visual field deficit or scleral icterus.       Right eye: No discharge.        Left eye: No discharge.     Conjunctiva/sclera: Conjunctivae normal.     Pupils: Pupils are equal, round, and reactive to light.  Neck:     Musculoskeletal: Normal range of motion and neck supple.     Thyroid: No thyromegaly.     Vascular: No JVD.     Trachea: No tracheal deviation.  Cardiovascular:     Rate and Rhythm: Normal rate and regular rhythm.     Pulses:          Radial pulses are 2+ on the right side and 2+ on the left side.     Heart sounds: Normal heart sounds. No murmur.  Pulmonary:  Effort: Pulmonary effort is normal. No respiratory distress.     Breath sounds: Normal breath sounds. No stridor. No wheezing, rhonchi or rales.  Abdominal:     Palpations: Abdomen is soft.     Tenderness: There is no abdominal tenderness.  Musculoskeletal: Normal range of motion.        General: Tenderness present. No swelling or deformity.     Right shoulder: Normal.     Left shoulder: Normal.     Right elbow: Normal.    Left elbow: Normal.     Right hip: Normal.     Left hip: She exhibits normal  range of motion, normal strength, no tenderness, no bony tenderness, no swelling, no crepitus, no deformity and no laceration.     Right knee: Normal.     Left knee: Normal.     Right ankle: Normal.     Left ankle: Normal.     Cervical back: Normal.     Thoracic back: Normal.     Lumbar back: She exhibits tenderness, pain and spasm. She exhibits normal range of motion, no bony tenderness, no swelling, no edema, no deformity, no laceration and normal pulse.     Right hand: Normal.     Left hand: Normal.     Right upper leg: Normal.     Left upper leg: She exhibits no tenderness, no bony tenderness, no swelling, no edema, no deformity and no laceration.     Right lower leg: Normal. No edema.     Left lower leg: Normal. No edema.     Comments: Low lumbar L1-3 bilateral paraspinal pain tight; slow on/off exam table; gait sure and steady; muscle strength bilateral upper and lower extremities 5/5 equal  Lymphadenopathy:     Head:     Right side of head: No submental, preauricular, posterior auricular or occipital adenopathy.     Left side of head: No submental, preauricular, posterior auricular or occipital adenopathy.     Cervical: No cervical adenopathy.     Right cervical: No superficial cervical adenopathy.    Left cervical: No superficial cervical adenopathy.  Skin:    General: Skin is warm and dry.     Capillary Refill: Capillary refill takes less than 2 seconds.     Coloration: Skin is not ashen, cyanotic, jaundiced, mottled, pale or sallow.     Findings: No bruising, erythema, lesion or rash.     Nails: There is no clubbing.   Neurological:     General: No focal deficit present.     Mental Status: She is alert and oriented to person, place, and time. Mental status is at baseline. She is not disoriented.     GCS: GCS eye subscore is 4. GCS verbal subscore is 5. GCS motor subscore is 6.     Cranial Nerves: No cranial nerve deficit, dysarthria or facial asymmetry.     Sensory:  Sensation is intact. No sensory deficit.     Motor: Motor function is intact. No weakness, tremor, atrophy, abnormal muscle tone or seizure activity.     Coordination: Coordination is intact. Coordination normal.     Gait: Gait is intact. Gait normal.     Deep Tendon Reflexes: Reflexes normal.     Reflex Scores:      Patellar reflexes are 1+ on the right side and 1+ on the left side.      Achilles reflexes are 1+ on the right side and 1+ on the left side.    Comments: Strength  5/5 bilaterally arms/legs; gait sure and steady hallway  Psychiatric:        Attention and Perception: She is attentive.        Mood and Affect: Mood normal.        Speech: Speech normal.        Behavior: Behavior normal. Behavior is cooperative.        Thought Content: Thought content normal.        Judgment: Judgment normal.           Assessment & Plan:  A-low back pain, obesity  P-cyclobenazeprine/flexeril 5-10mg  po qhs prn muscle spasms #30 RF0 dispensed from [facility].  Ibuprofen 800mg  po TID prn pain #30 RF0 dispensed from [facility].  Avoid alcohol intake and driving while taking cyclobenazeprine/flexeril as drowsiness common side effect.  Slow position changes as medication also lower blood pressure.  Home stretches demonstrated to patient-e.g. lateral bending, flexion, extension, knee to chest and rock side to side on back. Self massage or professional prn, foam roller use or tennis/racquetball.  Heat/cryotherapy 15 minutes QID prn. Avoid prolonged static positions except during sleep.  Consider physical therapy referral if no improvement with prescribed therapy from [facility] and/or chiropractic care.  Ensure ergonomics correct desk at work avoid repetitive motions if possible/holding phone/laptop in hand use desk/stand and/or break up lifting items into smaller loads/weights.  Patient was instructed to rest, ice, and ROM exercises.  Activity as tolerated.   Follow up if symptoms persist or worsen especially if loss of  bowel/bladder control, arm/leg weakness and/or saddle paresthesias same day re-eval by a provider.  Exitcare handout on muscle spasms, low back pain, core exercises given to patient.  Patient verbalized agreement and understanding of treatment plan and had no further questions at this time.  P2:  Injury Prevention and Fitness.

## 2018-07-24 NOTE — Patient Instructions (Addendum)
Core Strength Exercises  Core exercises help to build strength in the muscles between your ribs and your hips (abdominal muscles). These muscles help to support your body and keep your spine stable. It is important to maintain strength in your core to prevent injury and pain. Some activities, such as yoga and Pilates, can help to strengthen core muscles. You can also strengthen core muscles with exercises at home. It is important to talk to your health care provider before you start a new exercise routine. What are the benefits of core strength exercises? Core strength exercises can:  Reduce back pain.  Help to rebuild strength after a back or spine injury.  Help to prevent injury during physical activity, especially injuries to the back and knees. How to do core strength exercises Repeat these exercises 10-15 times, or until you are tired. Do exercises exactly as told by your health care provider and adjust them as directed. It is normal to feel mild stretching, pulling, tightness, or discomfort as you do these exercises. If you feel any pain while doing these exercises, stop. If your pain continues or gets worse when doing core exercises, contact your health care provider. You may want to use a padded yoga or exercise mat for strength exercises that are done on the floor. Bridging  1. Lie on your back on a firm surface with your knees bent and your feet flat on the floor. 2. Raise your hips so that your knees, hips, and shoulders form a straight line together. Keep your abdominal muscles tight. 3. Hold this position for 3-5 seconds. 4. Slowly lower your hips to the starting position. 5. Let your muscles relax completely between repetitions. Single-leg bridge 1. Lie on your back on a firm surface with your knees bent and your feet flat on the floor. 2. Raise your hips so that your knees, hips, and shoulders form a straight line together. Keep your abdominal muscles tight. 3. Lift one foot  off the floor, then completely straighten that leg. 4. Hold this position for 3-5 seconds. 5. Put the straight leg back down in the bent position. 6. Slowly lower your hips to the starting position. 7. Repeat these steps using your other leg. Side bridge 1. Lie on your side with your knees bent. Prop yourself up on the elbow that is near the floor. 2. Using your abdominal muscles and your elbow that is on the floor, raise your body off the floor. Raise your hip so that your shoulder, hip, and foot form a straight line together. 3. Hold this position for 10 seconds. Keep your head and neck raised and away from your shoulder (in their normal, neutral position). Keep your abdominal muscles tight. 4. Slowly lower your hip to the starting position. 5. Repeat and try to hold this position longer, working your way up to 30 seconds. Abdominal crunch 1. Lie on your back on a firm surface. Bend your knees and keep your feet flat on the floor. 2. Cross your arms over your chest. 3. Without bending your neck, tip your chin slightly toward your chest. 4. Tighten your abdominal muscles as you lift your chest just high enough to lift your shoulder blades off of the floor. Do not hold your breath. You can do this with short lifts or long lifts. 5. Slowly return to the starting position. Bird dog 1. Get on your hands and knees, with your legs shoulder-width apart and your arms under your shoulders. Keep your back straight. 2. Tighten   your abdominal muscles. 3. Raise one of your legs off the floor and straighten it. Try to keep it parallel to the floor. 4. Slowly lower your leg to the starting position. 5. Raise one of your arms off the floor and straighten it. Try to keep it parallel to the floor. 6. Slowly lower your arm to the starting position. 7. Repeat with the other arm and leg. If possible, try raising a leg and arm at the same time, on opposite sides of the body. For example, raise your left hand and  your right leg. Plank 1. Lie on your belly. 2. Prop up your body onto your forearms and your feet, keeping your legs straight. Your body should make a straight line between your shoulders and feet. 3. Hold this position for 10 seconds while keeping your abdominal muscles tight. 4. Lower your body to the starting position. 5. Repeat and try to hold this position longer, working your way up to 30 seconds. Cross-core strengthening 1. Stand with your feet shoulder-width apart. 2. Hold a ball out in front of you. Keep your arms straight. 3. Tighten your abdominal muscles and slowly rotate at your waist from side to side. Keep your feet flat. 4. Once you are comfortable, try repeating this exercise with a heavier ball. Top core strengthening 1. Stand about 18 inches (46 cm) in front of a wall, with your back to the wall. 2. Keep your feet flat and shoulder-width apart. 3. Tighten your abdominal muscles. 4. Bend your hips and knees. 5. Slowly reach between your legs to touch the wall behind you. 6. Slowly stand back up. 7. Raise your arms over your head and reach behind you. 8. Return to the starting position. General tips  Do not do any exercises that cause pain. If you have pain while exercising, talk to your health care provider.  Always stretch before and after doing these exercises. This can help prevent injury.  Maintain a healthy weight. Ask your health care provider what weight is healthy for you. Contact a health care provider if:  You have back pain that gets worse or does not go away.  You feel pain while doing core strength exercises. Get help right away if:  You have severe pain that does not get better with medicine. Summary  Core exercises help to build strength in the muscles between your ribs and your waist.  Core muscles help to support your body and keep your spine stable.  Some activities, such as yoga and Pilates, can help to strengthen core muscles.  Core  strength exercises can help back pain and can prevent injury.  If you feel any pain while doing core strength exercises, stop. This information is not intended to replace advice given to you by your health care provider. Make sure you discuss any questions you have with your health care provider. Document Released: 11/14/2016 Document Revised: 11/14/2016 Document Reviewed: 11/14/2016 Elsevier Interactive Patient Education  2019 [employer] Acute Back Pain, Adult Acute back pain is sudden and usually short-lived. It is often caused by an injury to the muscles and tissues in the back. The injury may result from:  A muscle or ligament getting overstretched or torn (strained). Ligaments are tissues that connect bones to each other. Lifting something improperly can cause a back strain.  Wear and tear (degeneration) of the spinal disks. Spinal disks are circular tissue that provides cushioning between the bones of the spine (vertebrae).  Twisting motions, such as while playing sports  or doing yard work.  A hit to the back.  Arthritis. You may have a physical exam, lab tests, and imaging tests to find the cause of your pain. Acute back pain usually goes away with rest and home care. Follow these instructions at home: Managing pain, stiffness, and swelling  Take over-the-counter and prescription medicines only as told by your health care provider.  Your health care provider may recommend applying ice during the first 24-48 hours after your pain starts. To do this: ? Put ice in a plastic bag. ? Place a towel between your skin and the bag. ? Leave the ice on for 20 minutes, 2-3 times a day.  If directed, apply heat to the affected area as often as told by your health care provider. Use the heat source that your health care provider recommends, such as a moist heat pack or a heating pad. ? Place a towel between your skin and the heat source. ? Leave the heat on for 20-30 minutes. ? Remove the  heat if your skin turns bright red. This is especially important if you are unable to feel pain, heat, or cold. You have a greater risk of getting burned. Activity   Do not stay in bed. Staying in bed for more than 1-2 days can delay your recovery.  Sit up and stand up straight. Avoid leaning forward when you sit, or hunching over when you stand. ? If you work at a desk, sit close to it so you do not need to lean over. Keep your chin tucked in. Keep your neck drawn back, and keep your elbows bent at a right angle. Your arms should look like the letter "L." ? Sit high and close to the steering wheel when you drive. Add lower back (lumbar) support to your car seat, if needed.  Take short walks on even surfaces as soon as you are able. Try to increase the length of time you walk each day.  Do not sit, drive, or stand in one place for more than 30 minutes at a time. Sitting or standing for long periods of time can put stress on your back.  Do not drive or use heavy machinery while taking prescription pain medicine.  Use proper lifting techniques. When you bend and lift, use positions that put less stress on your back: ? Jacumba your knees. ? Keep the load close to your body. ? Avoid twisting.  Exercise regularly as told by your health care provider. Exercising helps your back heal faster and helps prevent back injuries by keeping muscles strong and flexible.  Work with a physical therapist to make a safe exercise program, as recommended by your health care provider. Do any exercises as told by your physical therapist. Lifestyle  Maintain a healthy weight. Extra weight puts stress on your back and makes it difficult to have good posture.  Avoid activities or situations that make you feel anxious or stressed. Stress and anxiety increase muscle tension and can make back pain worse. Learn ways to manage anxiety and stress, such as through exercise. General instructions  Sleep on a firm mattress  in a comfortable position. Try lying on your side with your knees slightly bent. If you lie on your back, put a pillow under your knees.  Follow your treatment plan as told by your health care provider. This may include: ? Cognitive or behavioral therapy. ? Acupuncture or massage therapy. ? Meditation or yoga. Contact a health care provider if:  You have  pain that is not relieved with rest or medicine.  You have increasing pain going down into your legs or buttocks.  Your pain does not improve after 2 weeks.  You have pain at night.  You lose weight without trying.  You have a fever or chills. Get help right away if:  You develop new bowel or bladder control problems.  You have unusual weakness or numbness in your arms or legs.  You develop nausea or vomiting.  You develop abdominal pain.  You feel faint. Summary  Acute back pain is sudden and usually short-lived.  Use proper lifting techniques. When you bend and lift, use positions that put less stress on your back.  Take over-the-counter and prescription medicines and apply heat or ice as directed by your health care provider. This information is not intended to replace advice given to you by your health care provider. Make sure you discuss any questions you have with your health care provider. Document Released: 06/25/2005 Document Revised: 01/30/2018 Document Reviewed: 02/06/2017 Elsevier Interactive Patient Education  2019 [employer]  Muscle Cramps and Spasms Muscle cramps and spasms occur when a muscle or muscles tighten and you have no control over this tightening (involuntary muscle contraction). They are a common problem and can develop in any muscle. The most common place is in the calf muscles of the leg. Muscle cramps and muscle spasms are both involuntary muscle contractions, but there are some differences between the two: Muscle cramps are painful. They come and go and may last for a few seconds or up to  15 minutes. Muscle cramps are often more forceful and last longer than muscle spasms. Muscle spasms may or may not be painful. They may also last just a few seconds or much longer. Certain medical conditions, such as diabetes or Parkinson's disease, can make it more likely to develop cramps or spasms. However, cramps or spasms are usually not caused by a serious underlying problem. Common causes include: Doing more physical work or exercise than your body is ready for (overexertion). Overuse from repeating certain movements too many times. Remaining in a certain position for a long period of time. Improper preparation, form, or technique while playing a sport or doing an activity. Dehydration. Injury. Side effects of some medicines. Abnormally low levels of the salts and minerals in your blood (electrolytes), especially potassium and calcium. This could happen if you are taking water pills (diuretics) or if you are pregnant. In many cases, the cause of muscle cramps or spasms is not known. Follow these instructions at home: Managing pain and stiffness     Try massaging, stretching, and relaxing the affected muscle. Do this for several minutes at a time. If directed, apply heat to tight or tense muscles as often as told by your health care provider. Use the heat source that your health care provider recommends, such as a moist heat pack or a heating pad. Place a towel between your skin and the heat source. Leave the heat on for 20-30 minutes. Remove the heat if your skin turns bright red. This is especially important if you are unable to feel pain, heat, or cold. You may have a greater risk of getting burned. If directed, put ice on the affected area. This may help if you are sore or have pain after a cramp or spasm. Put ice in a plastic bag. Place a towel between your skin and the bag. Leavethe ice on for 20 minutes, 2-3 times a day. Try taking  hot showers or baths to help relax tight  muscles. Eating and drinking Drink enough fluid to keep your urine pale yellow. Staying well hydrated may help prevent cramps or spasms. Eat a healthy diet that includes plenty of nutrients to help your muscles function. A healthy diet includes fruits and vegetables, lean protein, whole grains, and low-fat or nonfat dairy products. General instructions If you are having frequent cramps, avoid intense exercise for several days. Take over-the-counter and prescription medicines only as told by your health care provider. Pay attention to any changes in your symptoms. Keep all follow-up visits as told by your health care provider. This is important. Contact a health care provider if: Your cramps or spasms get more severe or happen more often. Your cramps or spasms do not improve over time. Summary Muscle cramps and spasms occur when a muscle or muscles tighten and you have no control over this tightening (involuntary muscle contraction). The most common place for cramps or spasms to occur is in the calf muscles of the leg. Massaging, stretching, and relaxing the affected muscle may relieve the cramp or spasm. Drink enough fluid to keep your urine pale yellow. Staying well hydrated may help prevent cramps or spasms. This information is not intended to replace advice given to you by your health care provider. Make sure you discuss any questions you have with your health care provider. Document Released: 12/15/2001 Document Revised: 11/18/2017 Document Reviewed: 11/18/2017 Elsevier Interactive Patient Education  2019 [employer]

## 2018-08-01 ENCOUNTER — Ambulatory Visit: Payer: Self-pay | Admitting: *Deleted

## 2018-08-01 VITALS — BP 118/72 | Ht 66.0 in | Wt 235.0 lb

## 2018-08-01 DIAGNOSIS — Z0000 Encounter for general adult medical examination without abnormal findings: Secondary | ICD-10-CM

## 2018-08-01 NOTE — Progress Notes (Signed)
[employer] insurance premium discount evaluation: Labs Drawn. Replacements ROI form signed. Tobacco Free Attestation form signed.  Forms placed in paper chart. Okay to route results to pcp MD. Pt reports she and her husband are trying to eat low carb, high protein on a modified version of keto. She is not taking in as much fat as the diet typically recommends as she is concerned about those numbers. Diet and exercise recommendations for DM2 reviewed with pt.

## 2018-08-02 LAB — CMP12+LP+TP+TSH+6AC+CBC/D/PLT
ALT: 32 IU/L (ref 0–32)
AST: 20 IU/L (ref 0–40)
Albumin/Globulin Ratio: 1.8 (ref 1.2–2.2)
Albumin: 4.3 g/dL (ref 3.8–4.9)
Alkaline Phosphatase: 75 IU/L (ref 39–117)
BILIRUBIN TOTAL: 0.3 mg/dL (ref 0.0–1.2)
BUN/Creatinine Ratio: 24 — ABNORMAL HIGH (ref 9–23)
BUN: 19 mg/dL (ref 6–24)
Basophils Absolute: 0 10*3/uL (ref 0.0–0.2)
Basos: 0 %
Calcium: 9.1 mg/dL (ref 8.7–10.2)
Chloride: 98 mmol/L (ref 96–106)
Chol/HDL Ratio: 3.6 ratio (ref 0.0–4.4)
Cholesterol, Total: 201 mg/dL — ABNORMAL HIGH (ref 100–199)
Creatinine, Ser: 0.79 mg/dL (ref 0.57–1.00)
EOS (ABSOLUTE): 0.2 10*3/uL (ref 0.0–0.4)
Eos: 3 %
Estimated CHD Risk: 0.6 times avg. (ref 0.0–1.0)
Free Thyroxine Index: 2.3 (ref 1.2–4.9)
GFR calc Af Amer: 99 mL/min/{1.73_m2} (ref >59)
GFR calc non Af Amer: 86 mL/min/{1.73_m2} (ref >59)
GGT: 38 IU/L (ref 0–60)
Globulin, Total: 2.4 g/dL (ref 1.5–4.5)
Glucose: 82 mg/dL (ref 65–99)
HDL: 56 mg/dL (ref >39)
Hematocrit: 44.3 % (ref 34.0–46.6)
Hemoglobin: 14.6 g/dL (ref 11.1–15.9)
IMMATURE GRANS (ABS): 0 10*3/uL (ref 0.0–0.1)
IMMATURE GRANULOCYTES: 0 %
Iron: 79 ug/dL (ref 27–159)
LDH: 150 IU/L (ref 119–226)
LDL CALC: 112 mg/dL — AB (ref 0–99)
LYMPHS ABS: 1.9 10*3/uL (ref 0.7–3.1)
Lymphs: 28 %
MCH: 27.7 pg (ref 26.6–33.0)
MCHC: 33 g/dL (ref 31.5–35.7)
MCV: 84 fL (ref 79–97)
Monocytes Absolute: 0.5 10*3/uL (ref 0.1–0.9)
Monocytes: 7 %
NEUTROS PCT: 62 %
Neutrophils Absolute: 4.3 10*3/uL (ref 1.4–7.0)
Phosphorus: 3.7 mg/dL (ref 3.0–4.3)
Platelets: 360 10*3/uL (ref 150–450)
Potassium: 4.3 mmol/L (ref 3.5–5.2)
RBC: 5.27 x10E6/uL (ref 3.77–5.28)
RDW: 13.5 % (ref 11.7–15.4)
Sodium: 138 mmol/L (ref 134–144)
T3 Uptake Ratio: 27 % (ref 24–39)
T4, Total: 8.5 ug/dL (ref 4.5–12.0)
TSH: 1.03 u[IU]/mL (ref 0.450–4.500)
Total Protein: 6.7 g/dL (ref 6.0–8.5)
Triglycerides: 166 mg/dL — ABNORMAL HIGH (ref 0–149)
Uric Acid: 7.7 mg/dL — ABNORMAL HIGH (ref 2.5–7.1)
VLDL CHOLESTEROL CAL: 33 mg/dL (ref 5–40)
WBC: 6.9 10*3/uL (ref 3.4–10.8)

## 2018-08-02 LAB — MAGNESIUM: MAGNESIUM: 1.9 mg/dL (ref 1.6–2.3)

## 2018-08-02 LAB — HGB A1C W/O EAG: Hgb A1c MFr Bld: 6.2 % — ABNORMAL HIGH (ref 4.8–5.6)

## 2019-01-27 ENCOUNTER — Encounter: Payer: Self-pay | Admitting: Registered Nurse

## 2019-01-27 ENCOUNTER — Telehealth: Payer: Self-pay | Admitting: Registered Nurse

## 2019-01-27 MED ORDER — IBUPROFEN 800 MG PO TABS: 800.0000 mg | ORAL_TABLET | Freq: Three times a day (TID) | ORAL | 0 refills | Status: AC | PRN

## 2019-01-27 NOTE — Telephone Encounter (Signed)
Patient requested refill of motrin 800mg  po TID prn pain #30 RF0 dispensed from [facility] to patient.  Last fill Jan 2020.  Take with food to decrease risk of gastritis.  Patient stated she is not taking meloxicam.  Discussed she should not take ibuprofen/motrin/naproxen/mobic/meloxicam/advil/aleve/naprosyn with each other-choose only one.  Patient verbalized understanding information/instructions, agreed with plan of care and had no further questions at this time.

## 2019-03-26 ENCOUNTER — Telehealth: Payer: Self-pay | Admitting: Registered Nurse

## 2019-03-26 ENCOUNTER — Encounter: Payer: Self-pay | Admitting: Registered Nurse

## 2019-03-26 NOTE — Telephone Encounter (Signed)
Patient requested medication refills from [facility]   PCM appt 04/27/2019 needs bridge refill on metformin 500mg  po qam #90/1000mg  po qpm #90, atorvastatin 40mg  po daily #90, hydrochlorothiazide 25mg  po daily #90, lisinopril 10mg  po daily #90, omeprazole DR 40mg  po daily #90 and motrin 800mg  po TID prn pain moderate #30.  Motrin prn chronic intermittent back pain 800mg  po TID #30 RF0 take with food.  Last fasting labs 08/01/2018  HgbA1c 6.2 normal GFR/cr/liver function/electrolytes.  Lipids total/LDL/trigs elevated on atorvastatin.  Patient notified need new Rx to continue [facility] fills.  Patient verbalized understanding information/instructions, agreed with plan of care and had no further questions at this time.

## 2019-04-20 ENCOUNTER — Other Ambulatory Visit: Payer: Self-pay

## 2019-04-20 ENCOUNTER — Ambulatory Visit: Payer: Self-pay | Admitting: *Deleted

## 2019-04-20 VITALS — BP 122/72 | HR 75 | Temp 97.7°F | Wt 227.0 lb

## 2019-04-20 DIAGNOSIS — Z0000 Encounter for general adult medical examination without abnormal findings: Secondary | ICD-10-CM

## 2019-04-20 NOTE — Progress Notes (Signed)
Annual fasting labs drawn ahead of pcp CPE which is being completed virtually later today. Obtained VS and Wt today and informed pt she could let pcp know to reference these during appt as they are [facility] users. PCP cc'd on lab results. Will review with pt over phone tomorrow and she can come to clinic if hard copy of labs is needed. Pt verbalizes understanding and agreement.

## 2019-04-20 NOTE — Addendum Note (Signed)
Addended by: Beckie Busing on: 04/20/2019 10:02 AM   Modules accepted: Orders

## 2019-04-21 LAB — CMP12+LP+TP+TSH+6AC+CBC/D/PLT
ALT: 34 IU/L — ABNORMAL HIGH (ref 0–32)
AST: 17 IU/L (ref 0–40)
Albumin/Globulin Ratio: 1.7 (ref 1.2–2.2)
Albumin: 4.2 g/dL (ref 3.8–4.9)
Alkaline Phosphatase: 104 IU/L (ref 39–117)
BUN/Creatinine Ratio: 25 — ABNORMAL HIGH (ref 9–23)
BUN: 20 mg/dL (ref 6–24)
Basophils Absolute: 0 10*3/uL (ref 0.0–0.2)
Basos: 0 %
Bilirubin Total: 0.3 mg/dL (ref 0.0–1.2)
Calcium: 9.3 mg/dL (ref 8.7–10.2)
Chloride: 99 mmol/L (ref 96–106)
Chol/HDL Ratio: 3.5 ratio (ref 0.0–4.4)
Cholesterol, Total: 193 mg/dL (ref 100–199)
Creatinine, Ser: 0.79 mg/dL (ref 0.57–1.00)
EOS (ABSOLUTE): 0.2 10*3/uL (ref 0.0–0.4)
Eos: 3 %
Estimated CHD Risk: 0.6 times avg. (ref 0.0–1.0)
Free Thyroxine Index: 2.1 (ref 1.2–4.9)
GFR calc Af Amer: 98 mL/min/{1.73_m2} (ref >59)
GFR calc non Af Amer: 85 mL/min/{1.73_m2} (ref >59)
GGT: 53 IU/L (ref 0–60)
Globulin, Total: 2.5 g/dL (ref 1.5–4.5)
Glucose: 99 mg/dL (ref 65–99)
HDL: 55 mg/dL (ref >39)
Hematocrit: 43.6 % (ref 34.0–46.6)
Hemoglobin: 14.2 g/dL (ref 11.1–15.9)
Immature Grans (Abs): 0 10*3/uL (ref 0.0–0.1)
Immature Granulocytes: 0 %
Iron: 88 ug/dL (ref 27–159)
LDH: 155 IU/L (ref 119–226)
LDL Chol Calc (NIH): 101 mg/dL — ABNORMAL HIGH (ref 0–99)
Lymphocytes Absolute: 1.8 10*3/uL (ref 0.7–3.1)
Lymphs: 27 %
MCH: 28 pg (ref 26.6–33.0)
MCHC: 32.6 g/dL (ref 31.5–35.7)
MCV: 86 fL (ref 79–97)
Monocytes Absolute: 0.5 10*3/uL (ref 0.1–0.9)
Monocytes: 7 %
Neutrophils Absolute: 4.4 10*3/uL (ref 1.4–7.0)
Neutrophils: 63 %
Phosphorus: 4.2 mg/dL (ref 3.0–4.3)
Platelets: 362 10*3/uL (ref 150–450)
Potassium: 4.2 mmol/L (ref 3.5–5.2)
RBC: 5.07 x10E6/uL (ref 3.77–5.28)
RDW: 13.3 % (ref 11.7–15.4)
Sodium: 137 mmol/L (ref 134–144)
T3 Uptake Ratio: 27 % (ref 24–39)
T4, Total: 7.6 ug/dL (ref 4.5–12.0)
TSH: 1.36 u[IU]/mL (ref 0.450–4.500)
Total Protein: 6.7 g/dL (ref 6.0–8.5)
Triglycerides: 220 mg/dL — ABNORMAL HIGH (ref 0–149)
Uric Acid: 7 mg/dL (ref 2.5–7.1)
VLDL Cholesterol Cal: 37 mg/dL (ref 5–40)
WBC: 6.9 10*3/uL (ref 3.4–10.8)

## 2019-04-21 LAB — HGB A1C W/O EAG: Hgb A1c MFr Bld: 5.9 % — ABNORMAL HIGH (ref 4.8–5.6)

## 2019-04-21 LAB — VITAMIN D 25 HYDROXY (VIT D DEFICIENCY, FRACTURES): Vit D, 25-Hydroxy: 27.4 ng/mL — ABNORMAL LOW (ref 30.0–100.0)

## 2019-04-21 NOTE — Progress Notes (Signed)
noted 

## 2019-04-30 ENCOUNTER — Other Ambulatory Visit: Payer: Self-pay

## 2019-04-30 ENCOUNTER — Ambulatory Visit: Payer: Self-pay | Admitting: *Deleted

## 2019-04-30 DIAGNOSIS — Z23 Encounter for immunization: Secondary | ICD-10-CM

## 2019-04-30 NOTE — Progress Notes (Signed)
Per pt, she is due for Tetanus booster per her pcp. TDap administered in clinic. Routed documentation to PCP per pt request.

## 2019-05-05 ENCOUNTER — Other Ambulatory Visit: Payer: Self-pay

## 2019-05-05 ENCOUNTER — Ambulatory Visit: Payer: Self-pay | Admitting: Registered Nurse

## 2019-05-05 VITALS — BP 112/82 | HR 89

## 2019-05-05 DIAGNOSIS — Z6837 Body mass index (BMI) 37.0-37.9, adult: Secondary | ICD-10-CM

## 2019-05-05 DIAGNOSIS — M545 Low back pain, unspecified: Secondary | ICD-10-CM

## 2019-05-05 DIAGNOSIS — G8929 Other chronic pain: Secondary | ICD-10-CM

## 2019-05-05 DIAGNOSIS — M25552 Pain in left hip: Secondary | ICD-10-CM

## 2019-05-05 DIAGNOSIS — M25561 Pain in right knee: Secondary | ICD-10-CM

## 2019-05-05 MED ORDER — DICLOFENAC SODIUM 75 MG PO TBEC: 75.0000 mg | DELAYED_RELEASE_TABLET | Freq: Two times a day (BID) | ORAL | 0 refills | Status: AC | PRN

## 2019-05-05 NOTE — Patient Instructions (Signed)
Preventing Health Risks of Being Overweight Maintaining a healthy body weight is an important part of your overall health. Your healthy body weight depends on your age, gender, and height. Being overweight puts you at risk for many health problems, including:  Heart disease.  Diabetes.  Problems sleeping.  Joint problems. You can make changes to your diet and lifestyle to prevent these risks. Consider working with a health care provider or a dietitian to make these changes. What nutrition changes can be made?   Eat only as much as your body needs. In most cases, this is about 2,000 calories a day, but the amount varies depending on your height, gender, and activity level. Ask your health care provider how many calories you should have each day. Eating more than your body needs on a regular basis can cause you to become overweight or obese.  Eat slowly, and stop eating when you feel full.  Choose healthy foods, including: ? Fruits and vegetables. ? Lean meats. ? Low-fat dairy products. ? High-fiber foods, such as whole grains and beans. ? Healthy snacks like vegetable sticks, a piece of fruit, or a small amount of yogurt or cheese.  Avoid foods and drinks that are high in sugar, salt (sodium), saturated fat, or trans fat. This includes: ? Many desserts such as candy, cookies, and ice cream. ? Soda. ? Fried foods. ? Processed meats such as hot dogs or lunch meats. ? Prepackaged snack foods. What lifestyle changes can be made?   Exercise for at least 150 minutes a week to prevent weight gain, or as often as recommended by your health care provider. Do moderate-intensity exercise, such as brisk walking. ? Spread it out by exercising for 30 minutes 5 days a week, or in short 10-minute bursts several times a day.  Find other ways to stay active and burn calories, such as yard work or a hobby that involves physical activity.  Get at least 8 hours of sleep each night. When you are  well-rested, you are more likely to be active and make healthy choices during the day. To sleep better: ? Try to go to bed and wake up at about the same time every day. ? Keep your bedroom dark, quiet, and cool. ? Make sure that your bed is comfortable. ? Avoid stimulating activities, such as watching television or exercising, for at least one hour before bedtime. Why are these changes important? Eating healthy and being active helps you lose weight and prevent health problems caused by being overweight. Making these changes can also help you manage stress, feel better mentally, and connect with friends and family. What can happen if changes are not made? Being overweight can affect you for your entire life. You may develop joint or bone problems that make it painful or difficult for you to play sports or do activities you enjoy. Being overweight puts stress on your heart and lungs and can lead to medical problems like diabetes, heart disease, and sleeping problems. Where to find support You can get support for preventing health risks of being overweight from:  Your health care provider or a dietitian. They can provide guidance about healthy eating and healthy lifestyle choices.  Weight loss support groups, online or in-person. Where to find more information  MyPlate: FormerBoss.no ? This an online tool that provides personalized recommendations about foods to eat each day.  The Centers for Disease Control and Prevention: http://sharp-hammond.biz/ ? This resource gives tips for managing weight and having an active lifestyle.  Summary  To prevent unhealthy weight gain, it is important to maintain a healthy diet high in vegetables and whole grains, exercise regularly, and get at least 8 hours of sleep each night.  Making these changes helps prevent many long-term (chronic) health conditions that can shorten your life, such as diabetes, heart disease, and stroke. This information is  not intended to replace advice given to you by your health care provider. Make sure you discuss any questions you have with your health care provider. Document Released: 05/22/2017 Document Revised: 06/28/2017 Document Reviewed: 05/22/2017 Elsevier Patient Education  2020 [employer] is a term that is commonly used to refer to joint pain or joint disease. There are more than 100 types of arthritis. What are the causes? The most common cause of this condition is wear and tear of a joint. Other causes include:  Gout.  Inflammation of a joint.  An infection of a joint.  Sprains and other injuries near the joint.  A reaction to medicines or drugs, or an allergic reaction. In some cases, the cause may not be known. What are the signs or symptoms? The main symptom of this condition is pain in the joint during movement. Other symptoms include:  Redness, swelling, or stiffness at a joint.  Warmth coming from the joint.  Fever.  Overall feeling of illness. How is this diagnosed? This condition may be diagnosed with a physical exam and tests, including:  Blood tests.  Urine tests.  Imaging tests, such as X-rays, an MRI, or a CT scan. Sometimes, fluid is removed from a joint for testing. How is this treated? This condition may be treated with:  Treatment of the cause, if it is known.  Rest.  Raising (elevating) the joint.  Applying cold or hot packs to the joint.  Medicines to improve symptoms and reduce inflammation.  Injections of a steroid such as cortisone into the joint to help reduce pain and inflammation. Depending on the cause of your arthritis, you may need to make lifestyle changes to reduce stress on your joint. Changes may include:  Exercising more.  Losing weight. Follow these instructions at home: Medicines  Take over-the-counter and prescription medicines only as told by your health care provider.  Do not take aspirin to relieve  pain if your health care provider thinks that gout may be causing your pain. Activity  Rest your joint if told by your health care provider. Rest is important when your disease is active and your joint feels painful, swollen, or stiff.  Avoid activities that make the pain worse. It is important to balance activity with rest.  Exercise your joint regularly with range-of-motion exercises as told by your health care provider. Try doing low-impact exercise, such as: ? Swimming. ? Water aerobics. ? Biking. ? Walking. Managing pain, stiffness, and swelling      If directed, put ice on the joint. ? Put ice in a plastic bag. ? Place a towel between your skin and the bag. ? Leave the ice on for 20 minutes, 2-3 times per day.  If your joint is swollen, raise (elevate) it above the level of your heart if directed by your health care provider.  If your joint feels stiff in the morning, try taking a warm shower.  If directed, apply heat to the affected area as often as told by your health care provider. Use the heat source that your health care provider recommends, such as a moist heat pack or a heating pad.  If you have diabetes, do not apply heat without permission from your health care provider. To apply heat: ? Place a towel between your skin and the heat source. ? Leave the heat on for 20-30 minutes. ? Remove the heat if your skin turns bright red. This is especially important if you are unable to feel pain, heat, or cold. You may have a greater risk of getting burned. General instructions  Do not use any products that contain nicotine or tobacco, such as cigarettes, e-cigarettes, and chewing tobacco. If you need help quitting, ask your health care provider.  Keep all follow-up visits as told by your health care provider. This is important. Contact a health care provider if:  The pain gets worse.  You have a fever. Get help right away if:  You develop severe joint pain, swelling, or  redness.  Many joints become painful and swollen.  You develop severe back pain.  You develop severe weakness in your leg.  You cannot control your bladder or bowels. Summary  Arthritis is a term that is commonly used to refer to joint pain or joint disease. There are more than 100 types of arthritis.  The most common cause of this condition is wear and tear of a joint. Other causes include gout, inflammation or infection of the joint, sprains, or allergies.  Symptoms of this condition include redness, swelling, or stiffness of the joint. Other symptoms include warmth, fever, or feeling ill.  This condition is treated with rest, elevation, medicines, and applying cold or hot packs.  Follow your health care provider's instructions about medicines, activity, exercises, and other home care treatments. This information is not intended to replace advice given to you by your health care provider. Make sure you discuss any questions you have with your health care provider. Document Released: 08/02/2004 Document Revised: 06/02/2018 Document Reviewed: 06/02/2018 Elsevier Patient Education  2020 [employer].

## 2019-05-05 NOTE — Progress Notes (Signed)
Subjective:    Patient ID: Christine Rojas, female    DOB: [birth year], 53 y.o.   MRN: [id number]  53y/o Caucasian established female pt c/o joint pain back/knees and hips x2 years. States she has been told she has arthritis by chiropractor and primary care provider. Pain bilateral hips, knees, and lower back. Flares at times but unsure what may initiate the flares. She has never taken anything for arthritis before and was told by another employee of medicine that was received from the clinic that has helped with that employee's arthritis and would like to discuss starting something similar. Currently does take Ibuprofen 800mg  po TID prn pain. Has taken one tab daily for past 3 days. No other use in prior 2 weeks.  Chart reviewed and noted had Rx for meloxicam from [facility] back pain.  Patient reported her pain went away doesn't remember anything about the medicine specifically if it worked well, upset her stomach, etc.  Patient denied trauma, loss of bowel/bladder control, saddle paresthesias, arm/leg weakness, bruising or headaches.      Review of Systems  Constitutional: Negative for activity change, appetite change, chills, diaphoresis, fatigue and fever.  HENT: Negative for trouble swallowing and voice change.   Eyes: Negative for photophobia and visual disturbance.  Respiratory: Negative for cough, shortness of breath, wheezing and stridor.   Cardiovascular: Negative for chest pain and leg swelling.  Gastrointestinal: Negative for abdominal pain, anal bleeding, blood in stool, diarrhea, nausea and vomiting.  Endocrine: Negative for cold intolerance and heat intolerance.  Genitourinary: Negative for difficulty urinating.  Musculoskeletal: Positive for arthralgias, back pain and myalgias. Negative for gait problem, joint swelling, neck pain and neck stiffness.  Skin: Negative for color change, pallor, rash and wound.  Allergic/Immunologic: Negative for food allergies.  Neurological:  Negative for tremors, syncope, weakness and numbness.  Hematological: Negative for adenopathy. Does not bruise/bleed easily.  Psychiatric/Behavioral: Negative for agitation, confusion and sleep disturbance.       Objective:   Physical Exam Vitals signs and nursing note reviewed.  Constitutional:      General: She is awake. She is not in acute distress.    Appearance: Normal appearance. She is well-developed and well-groomed. She is obese. She is not ill-appearing, toxic-appearing or diaphoretic.  HENT:     Head: Normocephalic and atraumatic.     Jaw: There is normal jaw occlusion.     Right Ear: Hearing and external ear normal.     Left Ear: Hearing and external ear normal.     Nose: Nose normal.     Mouth/Throat:     Lips: Pink.     Mouth: Mucous membranes are moist.     Pharynx: Oropharynx is clear.  Eyes:     General: Lids are normal. Vision grossly intact. Gaze aligned appropriately. No visual field deficit.    Extraocular Movements: Extraocular movements intact.     Conjunctiva/sclera: Conjunctivae normal.     Pupils: Pupils are equal, round, and reactive to light.  Neck:     Musculoskeletal: Normal range of motion and neck supple. No neck rigidity.     Trachea: Trachea and phonation normal.  Cardiovascular:     Rate and Rhythm: Normal rate and regular rhythm.     Pulses:          Radial pulses are 2+ on the right side and 2+ on the left side.     Heart sounds: Normal heart sounds.  Pulmonary:     Effort: Pulmonary effort is  normal. No respiratory distress.     Breath sounds: Normal breath sounds and air entry. No stridor, decreased air movement or transmitted upper airway sounds. No decreased breath sounds, wheezing, rhonchi or rales.     Comments: Spoke full sentences without difficulty; wearing cloth mask due to covid 19 pandemic; no cough observed in exam room Abdominal:     General: Abdomen is flat.  Musculoskeletal: Normal range of motion.        General: No  swelling, deformity or signs of injury.     Right shoulder: Normal.     Left shoulder: Normal.     Right elbow: Normal.    Left elbow: Normal.     Right hip: She exhibits normal range of motion, normal strength, no swelling, no crepitus, no deformity and no laceration.     Left hip: She exhibits normal range of motion, normal strength, no swelling, no crepitus, no deformity and no laceration.     Right knee: She exhibits normal range of motion, no swelling, no effusion, no ecchymosis, no deformity, no laceration and no erythema.     Left knee: She exhibits normal range of motion, no swelling, no effusion, no ecchymosis, no deformity, no laceration and no erythema.     Right ankle: Normal.     Left ankle: Normal.     Cervical back: Normal.     Thoracic back: Normal.     Lumbar back: She exhibits pain. She exhibits normal range of motion, no swelling, no edema, no deformity and no laceration.     Right hand: Normal.     Left hand: Normal.     Right lower leg: No edema.     Left lower leg: No edema.     Comments: Pain midline low back at rest and with movement unchanged; in/out of chair and on/off exam table without difficulty; knee and hip pain at rest and with movement no crepitus auditory with AROM; bilateral strength upper and lower extremities equal 5/5  Lymphadenopathy:     Head:     Right side of head: No submental, submandibular, tonsillar, preauricular, posterior auricular or occipital adenopathy.     Left side of head: No submental, submandibular, tonsillar, preauricular, posterior auricular or occipital adenopathy.     Cervical: No cervical adenopathy.     Right cervical: No superficial cervical adenopathy.    Left cervical: No superficial cervical adenopathy.  Skin:    General: Skin is warm and dry.     Capillary Refill: Capillary refill takes less than 2 seconds.     Coloration: Skin is not ashen, cyanotic, jaundiced, mottled, pale or sallow.     Findings: No abrasion,  abscess, acne, bruising, burn, ecchymosis, erythema, signs of injury, laceration, lesion, petechiae, rash or wound.     Nails: There is no clubbing.   Neurological:     General: No focal deficit present.     Mental Status: She is alert and oriented to person, place, and time. Mental status is at baseline.     GCS: GCS eye subscore is 4. GCS verbal subscore is 5. GCS motor subscore is 6.     Cranial Nerves: Cranial nerves are intact. No cranial nerve deficit, dysarthria or facial asymmetry.     Sensory: Sensation is intact. No sensory deficit.     Motor: Motor function is intact. No weakness, tremor, atrophy, abnormal muscle tone or seizure activity.     Coordination: Coordination is intact. Coordination normal.     Gait: Gait  is intact. Gait normal.     Comments: Gait sure and steady in hallway; wearing lace up sneakers gait normal heel toe no limp normal stance/base  Psychiatric:        Attention and Perception: Attention and perception normal.        Mood and Affect: Mood and affect normal.        Speech: Speech normal.        Behavior: Behavior normal. Behavior is cooperative.        Thought Content: Thought content normal.        Cognition and Memory: Cognition and memory normal.        Judgment: Judgment normal.           Assessment & Plan:  A-chronic midline low back pain; bilateral hip and knee pain; BMI 37  P-Has been previously seen in clinic for recurrent back pain flares.  Patient typically handles chronic low back pain with stretching/NSAID (ibuprofen). TID prn. [facility] had her try mobic (daily dosing) she doesn't remember much about how it worked for her except back pain flare resolved.  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  Obese.  HgbA1c improved this year to 5.9 from 6.2.  Vitamin D still low 27.  Encouraged patient to continue to get sunshine 15 minutes daily and possibly start D3 supplement during the winter 1000  international units.  No xrays on file in Epic for review.  Renal function stable.  Chronically slightly elevated LFTs.  Excess weight and seated for long periods along with colder fall weather probably contributing to back/hip/knee pain.  Symptoms not consistent with gout and diabetes well controlled at this time.  Discussed take diclofenac with food once to twice a day prn.  Avoid motrin/advil/aleve/ibuprofen/naprosyn/naproxen/meloxicam and mobic within 24 hours of diclofenac dosing.  Separate aspirin and diclofenac by a couple of house as could increase risk of bleeding.  Discussed there is a topical version of diclofenac Rx that could be trialed if diclofenac po upsets her stomach.  Could re trial mobic once a day dosing as option also.  All NSAIDs put extra strain on kidneys avoid dehydration, take with food to avoid gastritis.  All NSAIDs counteract her blood pressure medication.  If abdomen pain/heartburn/bruising/headaches notify clinic staff and stop taking NSAIDS.  Encouraged weight loss/exercise 150 minutes per week.  Patient to follow up in 2-4 weeks.  Exitcare handout arthritis and preventing health risks of obesity.  Patient verbalized understanding information/instructions, agreed with plan of care and had no further questions at this time.

## 2019-05-25 ENCOUNTER — Other Ambulatory Visit: Payer: Self-pay

## 2019-05-25 DIAGNOSIS — Z20822 Contact with and (suspected) exposure to covid-19: Secondary | ICD-10-CM

## 2019-05-27 LAB — NOVEL CORONAVIRUS, NAA: SARS-CoV-2, NAA: NOT DETECTED

## 2019-06-09 ENCOUNTER — Telehealth: Payer: Self-pay | Admitting: Registered Nurse

## 2019-06-09 DIAGNOSIS — G8929 Other chronic pain: Secondary | ICD-10-CM

## 2019-06-09 DIAGNOSIS — M25552 Pain in left hip: Secondary | ICD-10-CM

## 2019-06-09 DIAGNOSIS — M545 Low back pain: Secondary | ICD-10-CM

## 2019-06-09 MED ORDER — DICLOFENAC SODIUM 75 MG PO TBEC: 75.0000 mg | DELAYED_RELEASE_TABLET | Freq: Two times a day (BID) | ORAL | 0 refills | Status: DC

## 2019-06-09 NOTE — Telephone Encounter (Signed)
Patient called and requested med refill with RN this am.  I walked to her workcenter to discuss with her as she has not had follow up appt with me since starting diclofenac.  Patient reported diclofenac 1-2 times per day 75mg  DR working better than mobic or motrin.  I discussed with her we would need to draw labs but she reported she had labs drawn recently.  I told her I would check her chart to let her know if we still needed to draw them.  She would like refill of diclofenac from [facility] formulary.  Patient verbalized understanding information/instructions, agreed with plan of care and had no further questions at this time.  Per epic review  Labs done 04/20/2019 and diclofenac initiated 05/05/2019.  Patient will need to schedule nonfasting CMET for renal and liver function check before next fill in 30 days.  RN Hildred Alamin notified to schedule patient and my chart message sent to patient to contact RN Hildred Alamin to schedule nonfasting labs.

## 2019-06-09 NOTE — Addendum Note (Signed)
Addended by: Gerarda Fraction A on: 06/09/2019 08:13 PM   Modules accepted: Orders

## 2019-06-09 NOTE — Telephone Encounter (Signed)
noted 

## 2019-06-09 NOTE — Telephone Encounter (Signed)
Pt scheduled for nonfasting labs on 06/11/2019 at 0930.

## 2019-06-11 ENCOUNTER — Ambulatory Visit: Payer: Self-pay | Admitting: *Deleted

## 2019-06-11 ENCOUNTER — Other Ambulatory Visit: Payer: Self-pay

## 2019-06-11 DIAGNOSIS — M545 Low back pain, unspecified: Secondary | ICD-10-CM

## 2019-06-11 DIAGNOSIS — M25551 Pain in right hip: Secondary | ICD-10-CM

## 2019-06-11 DIAGNOSIS — G8929 Other chronic pain: Secondary | ICD-10-CM

## 2019-06-11 NOTE — Progress Notes (Signed)
CMP one month post Diclofenac initiation.

## 2019-06-12 LAB — COMPREHENSIVE METABOLIC PANEL
ALT: 36 IU/L — ABNORMAL HIGH (ref 0–32)
AST: 17 IU/L (ref 0–40)
Albumin/Globulin Ratio: 1.9 (ref 1.2–2.2)
Albumin: 4.3 g/dL (ref 3.8–4.9)
Alkaline Phosphatase: 106 IU/L (ref 39–117)
BUN/Creatinine Ratio: 28 — ABNORMAL HIGH (ref 9–23)
BUN: 23 mg/dL (ref 6–24)
Bilirubin Total: 0.2 mg/dL (ref 0.0–1.2)
CO2: 24 mmol/L (ref 20–29)
Calcium: 9.3 mg/dL (ref 8.7–10.2)
Chloride: 100 mmol/L (ref 96–106)
Creatinine, Ser: 0.81 mg/dL (ref 0.57–1.00)
GFR calc Af Amer: 95 mL/min/{1.73_m2} (ref >59)
GFR calc non Af Amer: 83 mL/min/{1.73_m2} (ref >59)
Globulin, Total: 2.3 g/dL (ref 1.5–4.5)
Glucose: 108 mg/dL — ABNORMAL HIGH (ref 65–99)
Potassium: 4.5 mmol/L (ref 3.5–5.2)
Sodium: 139 mmol/L (ref 134–144)
Total Protein: 6.6 g/dL (ref 6.0–8.5)

## 2019-06-12 NOTE — Progress Notes (Signed)
Draw was nonfasting. Reviewed results and mychart mesg with pt when providing hard copy of results. Encouraged wt loss as noted and planned for repeat CBC, BMP, LFTs in 6-12 months unless pt reports abd pain/jaundice/bruising/etc prior to this. Pt reports she has decreased Diclofenac use to one tab daily instead of BID and this is managing her pain well. Pt denies any further questions/concerns.

## 2019-06-12 NOTE — Telephone Encounter (Signed)
Results reviewed elevated ALT slightly repeat LFTs 6 to 12 months see results note and mychart message to patient.

## 2019-07-07 ENCOUNTER — Telehealth: Payer: Self-pay | Admitting: Registered Nurse

## 2019-07-07 ENCOUNTER — Encounter: Payer: Self-pay | Admitting: Registered Nurse

## 2019-07-07 DIAGNOSIS — G8929 Other chronic pain: Secondary | ICD-10-CM

## 2019-07-07 DIAGNOSIS — M25552 Pain in left hip: Secondary | ICD-10-CM

## 2019-07-07 MED ORDER — DICLOFENAC SODIUM 75 MG PO TBEC: 75.0000 mg | DELAYED_RELEASE_TABLET | Freq: Two times a day (BID) | ORAL | 0 refills | Status: AC

## 2019-07-07 NOTE — Telephone Encounter (Signed)
Patient requested refill of diclofenac DR 75mg  po BID prn pain #60 Rf0 dispensed from [facility] to patient [place].  Labs 06/11/2019 renal function normal and ALT 36 slightly elevated/worsening from 04/20/2019 normal 08/01/2018 32 (high normal).  Arthritis flare due to cold weather temperatures.

## 2019-07-20 ENCOUNTER — Telehealth: Payer: Self-pay | Admitting: *Deleted

## 2019-07-20 NOTE — Telephone Encounter (Signed)
Noted agree with plan of care per CDC guidelines 

## 2019-07-20 NOTE — Telephone Encounter (Signed)
RN notified by HR manager Salley Slaughter that pt informed him last night that she had a friend at her home on 07/14/19 that has since tested positive for Covid.   Spoke with pt over phone. She denies any symptoms.   Per HR manager report and reviewed with pt, pt to follow 14 day asymptomatic with exposure to positive person quarantine per CDC recommendations. Day 1 of quarantine is 07/15/19. Will plan for testing on Day 10, date 07/24/19. Pt lives in [place], but prefers [facility] testing site. Appt made for Friday 07/24/19 at 12:00pm. Reviewed drive up testing instructions and directions with pt, ie attend appt time, remain in vehicle until instructed, wear mask, results back in 2-3 days. Will f/u pt on Monday 07/27/19 with anticipated results. If no sx develop and no fever in previous >24hrs without antipyretics, pt to complete quarantine 07/28/19 and return to work 07/29/19.    Reviewed possible Covid sx including cough, ShOB, sinusitis sx, sore throat, fever/chills, body aches, fatigue, loss of taste/smell, GI sx n/v/d. Also reviewed same day/emergent eval/ER precautions of dizziness/syncope, confusion, blue tint to lips/face, severe ShOB/difficulty breathing.    Pt verbalizes understanding and agreement with plan of care. No further questions/concerns at this time. Pt reminded to contact clinic with any changes in sx or questions/concerns.

## 2019-07-24 ENCOUNTER — Ambulatory Visit: Payer: PRIVATE HEALTH INSURANCE | Attending: Internal Medicine

## 2019-07-24 DIAGNOSIS — Z20822 Contact with and (suspected) exposure to covid-19: Secondary | ICD-10-CM

## 2019-07-25 LAB — NOVEL CORONAVIRUS, NAA: SARS-CoV-2, NAA: NOT DETECTED

## 2019-07-25 NOTE — Telephone Encounter (Signed)
Covid test results from 07/24/2019 still pending

## 2019-07-26 ENCOUNTER — Encounter: Payer: Self-pay | Admitting: *Deleted

## 2019-07-26 NOTE — Telephone Encounter (Signed)
Patient contacted via telephone.  Stated she saw my chart message and negative test results.  Not having any symptoms.  Reviewed quarantine and covid symptoms with patient again.  She is to maintain quarantine and continue monitoring for covid symptoms and notify clinic staff if symptoms occur as retesting may be indicated.  Patient verbalized understanding information/instructions, agreed with plan of care and had no further questions at this time.

## 2019-07-26 NOTE — Telephone Encounter (Signed)
My chart message sent to patient "Christine Rojas,  Your covid test results were negative.  Please continue quarantine at home through 07/28/2019 and contact HR/your supervisor prior to return to work onsite at [employer] on 07/29/2019.  Have you had any covid symptoms e.g. runny nose, sore throat, headache, loss of taste/smell, nausea/vomiting/diarrhea, fever/chills, body aches, cough, shortness of breath/dyspnea?  Please notify clinic staff if symptoms develop.  RN Rolly Salter in clinic M, Tu, Th and Fr typically [id number].  Quarantine to end 07/28/2019 and return to work 07/29/2019 as long as symptom free, no fever in previous 24 hours continues and no new known covid close positive contacts. Covid spread and incidence in community remains high.  Continue to protect self with mask wear, hand sanitizing washing, avoid touching face and maintaining social distancing 6 feet or greater.   [facility] HR Tim notified of negative test result.  I can be reached via mychart or [email or web address] if you have questions or concerns. Sincerely, Albina Billet NP-C"

## 2019-07-27 NOTE — Telephone Encounter (Signed)
Noted. Will f/u with pt tomorrow 1/19 prior to her planned return to work on 1/20 to ensure no change in status.

## 2019-08-02 NOTE — Telephone Encounter (Signed)
Patient seen in workcenter 07/30/2019.

## 2019-08-03 NOTE — Telephone Encounter (Signed)
noted 

## 2019-08-03 NOTE — Telephone Encounter (Signed)
Per HR, pt returned to work on 07/29/19 as expected.

## 2019-08-20 ENCOUNTER — Telehealth: Payer: Self-pay | Admitting: *Deleted

## 2019-08-20 NOTE — Telephone Encounter (Signed)
Pt reports pcp ordered Amoxicillin and sent to external pharmacy. Pt requests to fill in clinic dispensary instead. Rx located through outside med reconciliation and printed, placed in paper chart.

## 2019-09-08 ENCOUNTER — Telehealth: Payer: Self-pay | Admitting: Registered Nurse

## 2019-09-08 ENCOUNTER — Encounter: Payer: Self-pay | Admitting: Registered Nurse

## 2019-09-08 MED ORDER — FLUTICASONE PROPIONATE 50 MCG/ACT NA SUSP: 1.0000 | Freq: Two times a day (BID) | NASAL | 1 refills | Status: DC

## 2019-09-08 MED ORDER — DICLOFENAC SODIUM 75 MG PO TBEC: 75.0000 mg | DELAYED_RELEASE_TABLET | Freq: Two times a day (BID) | ORAL | 0 refills | Status: AC

## 2019-09-08 MED ORDER — SALINE SPRAY 0.65 % NA SOLN: 2.0000 | NASAL | 0 refills | Status: DC

## 2019-09-08 NOTE — Telephone Encounter (Signed)
Patient requesting refill diclofenac 75mg  po BID prn pain from [employer] formulary #60 RF0. Started 05/05/2019 for recurrent back and hand pain working  Well for her. Last CMET 06/11/2019 and CBC 04/20/2019 Stopped motrin/mobic.   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  Excess weight and seated for long periods along with colder fall weather probably contributing to back/hip/knee pain.  Symptoms not consistent with gout and diabetes well controlled at this time.  Discussed take diclofenac with food once to twice a day prn.  Avoid motrin/advil/aleve/ibuprofen/naprosyn/naproxen/meloxicam and mobic within 24 hours of diclofenac dosing.  Separate aspirin and diclofenac by a couple of house as could increase risk of bleeding.  Discussed there is a topical version of diclofenac Rx that could be trialed if diclofenac po upsets her stomach. All NSAIDs put extra strain on kidneys avoid dehydration, take with food to avoid gastritis.  All NSAIDs counteract her blood pressure medication.  If abdomen pain/heartburn/bruising/headaches notify clinic staff and stop taking NSAIDS.   Patient verbalized understanding information/instructions, agreed with plan of care and had no further questions at this time.

## 2019-11-05 ENCOUNTER — Telehealth: Payer: Self-pay | Admitting: Registered Nurse

## 2019-11-05 ENCOUNTER — Encounter: Payer: Self-pay | Admitting: Registered Nurse

## 2019-11-05 DIAGNOSIS — G8929 Other chronic pain: Secondary | ICD-10-CM | POA: Insufficient documentation

## 2019-11-05 MED ORDER — DICLOFENAC SODIUM 75 MG PO TBEC: 75.0000 mg | DELAYED_RELEASE_TABLET | Freq: Two times a day (BID) | ORAL | 0 refills | Status: AC

## 2019-11-05 NOTE — Telephone Encounter (Signed)
Patient requesting refill diclofenac 75mg  po BID prn pain from [employer] formulary #60 RF0. Started 05/05/2019 for recurrent back and hand pain working  Well for her. Last CMET 06/11/2019 and CBC 04/20/2019 Stopped motrin/mobic.  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 Excess weight and seated for long periods.  Recently had cold spell in weather after spring warm up probably contributing to back/hip/knee pain. Symptoms not consistent with gout and diabetes well controlled at this time. Discussed take diclofenac with food once to twice a day prn. Avoid motrin/advil/aleve/ibuprofen/naprosyn/naproxen/meloxicam and mobic within 24 hours of diclofenac dosing. Separate aspirin and diclofenac by a couple of hours as could increase risk of bleeding. Discussed there is a topical version of diclofenac Rx that could be trialed if diclofenac po upsets her stomach. All NSAIDs put extra strain on kidneys avoid dehydration, take with food to avoid gastritis. All NSAIDs counteract her blood pressure medication. If abdomen pain/heartburn/bruising/headaches notify clinic staff and stop taking NSAIDS. Patient verbalized understanding information/instructions, agreed with plan of care and had no further questions at this time.

## 2019-11-20 ENCOUNTER — Telehealth: Payer: Self-pay | Admitting: *Deleted

## 2019-11-20 NOTE — Telephone Encounter (Signed)
Pt reports by phone being seen at pcp office yesterday for CPE. The office told her they had no records of her labs from the past year. Results faxed to office after each draw and faxed again today. Jan, Oct, and Dec 2020 lab results sent.  RN spoke with MD office front desk today and rep reported "we've been having some issues with our fax recently." Stated no alternative fax number to use. Only email available, [email or web address]. Advised rep that email will be sent as Secure and log in will be required. She verbalizes understanding.

## 2019-12-15 ENCOUNTER — Ambulatory Visit: Payer: Self-pay | Admitting: *Deleted

## 2019-12-15 ENCOUNTER — Other Ambulatory Visit: Payer: Self-pay

## 2019-12-15 VITALS — Wt 240.0 lb

## 2019-12-15 DIAGNOSIS — Z0000 Encounter for general adult medical examination without abnormal findings: Secondary | ICD-10-CM

## 2019-12-15 NOTE — Progress Notes (Signed)
Annual labs ahead of upcoming CPE with pcp.

## 2019-12-16 LAB — CMP12+LP+TP+TSH+6AC+CBC/D/PLT
ALT: 45 IU/L — ABNORMAL HIGH (ref 0–32)
AST: 22 IU/L (ref 0–40)
Albumin/Globulin Ratio: 1.7 (ref 1.2–2.2)
Albumin: 4.5 g/dL (ref 3.8–4.9)
Alkaline Phosphatase: 106 IU/L (ref 48–121)
BUN/Creatinine Ratio: 28 — ABNORMAL HIGH (ref 9–23)
BUN: 22 mg/dL (ref 6–24)
Basophils Absolute: 0 10*3/uL (ref 0.0–0.2)
Basos: 1 %
Bilirubin Total: 0.3 mg/dL (ref 0.0–1.2)
Calcium: 9.5 mg/dL (ref 8.7–10.2)
Chloride: 97 mmol/L (ref 96–106)
Chol/HDL Ratio: 3.5 ratio (ref 0.0–4.4)
Cholesterol, Total: 197 mg/dL (ref 100–199)
Creatinine, Ser: 0.79 mg/dL (ref 0.57–1.00)
EOS (ABSOLUTE): 0.4 10*3/uL (ref 0.0–0.4)
Eos: 5 %
Estimated CHD Risk: 0.6 times avg. (ref 0.0–1.0)
Free Thyroxine Index: 2.5 (ref 1.2–4.9)
GFR calc Af Amer: 98 mL/min/{1.73_m2} (ref >59)
GFR calc non Af Amer: 85 mL/min/{1.73_m2} (ref >59)
GGT: 62 IU/L — ABNORMAL HIGH (ref 0–60)
Globulin, Total: 2.6 g/dL (ref 1.5–4.5)
Glucose: 107 mg/dL — ABNORMAL HIGH (ref 65–99)
HDL: 57 mg/dL (ref >39)
Hematocrit: 42.3 % (ref 34.0–46.6)
Hemoglobin: 14 g/dL (ref 11.1–15.9)
Immature Grans (Abs): 0 10*3/uL (ref 0.0–0.1)
Immature Granulocytes: 0 %
Iron: 70 ug/dL (ref 27–159)
LDH: 162 IU/L (ref 119–226)
LDL Chol Calc (NIH): 110 mg/dL — ABNORMAL HIGH (ref 0–99)
Lymphocytes Absolute: 1.8 10*3/uL (ref 0.7–3.1)
Lymphs: 23 %
MCH: 28.2 pg (ref 26.6–33.0)
MCHC: 33.1 g/dL (ref 31.5–35.7)
MCV: 85 fL (ref 79–97)
Monocytes Absolute: 0.6 10*3/uL (ref 0.1–0.9)
Monocytes: 8 %
Neutrophils Absolute: 4.9 10*3/uL (ref 1.4–7.0)
Neutrophils: 63 %
Phosphorus: 4.2 mg/dL (ref 3.0–4.3)
Platelets: 392 10*3/uL (ref 150–450)
Potassium: 4.6 mmol/L (ref 3.5–5.2)
RBC: 4.96 x10E6/uL (ref 3.77–5.28)
RDW: 12.8 % (ref 11.7–15.4)
Sodium: 137 mmol/L (ref 134–144)
T3 Uptake Ratio: 30 % (ref 24–39)
T4, Total: 8.4 ug/dL (ref 4.5–12.0)
TSH: 1.36 u[IU]/mL (ref 0.450–4.500)
Total Protein: 7.1 g/dL (ref 6.0–8.5)
Triglycerides: 170 mg/dL — ABNORMAL HIGH (ref 0–149)
Uric Acid: 7.1 mg/dL (ref 3.0–7.2)
VLDL Cholesterol Cal: 30 mg/dL (ref 5–40)
WBC: 7.8 10*3/uL (ref 3.4–10.8)

## 2019-12-16 LAB — VITAMIN D 25 HYDROXY (VIT D DEFICIENCY, FRACTURES): Vit D, 25-Hydroxy: 30.9 ng/mL (ref 30.0–100.0)

## 2019-12-16 LAB — MAGNESIUM: Magnesium: 1.9 mg/dL (ref 1.6–2.3)

## 2019-12-16 LAB — HGB A1C W/O EAG: Hgb A1c MFr Bld: 6.2 % — ABNORMAL HIGH (ref 4.8–5.6)

## 2019-12-17 NOTE — Progress Notes (Signed)
Noted 13 lb weight gain since last fall.  Patient going to check if vitamins USP/ISP tested also.  RN Rolly Salter counseled patient/reviewed instructions with her for follow up.

## 2019-12-17 NOTE — Progress Notes (Signed)
Results reviewed in clinic with pt. Advised per NP notes above. Pt reports etoh intake typically 1-2x/month. Unsure if vitamins are USP/ISP tested, will check at home. No other OTCs. Has had a 13# wt gain since Oct 2020. Diet and exercise recommendations discussed re: LDL, liver enzymes, A1c. Routine f/u with pcp. Copy of labs provided to pt. Results routed to pcp per pt request. No further questions/concerns.

## 2019-12-31 ENCOUNTER — Encounter: Payer: Self-pay | Admitting: Registered Nurse

## 2019-12-31 ENCOUNTER — Other Ambulatory Visit: Payer: Self-pay

## 2019-12-31 ENCOUNTER — Ambulatory Visit: Payer: Self-pay | Admitting: Registered Nurse

## 2019-12-31 VITALS — BP 132/90 | HR 64 | Temp 98.9°F

## 2019-12-31 DIAGNOSIS — J301 Allergic rhinitis due to pollen: Secondary | ICD-10-CM

## 2019-12-31 DIAGNOSIS — H6983 Other specified disorders of Eustachian tube, bilateral: Secondary | ICD-10-CM

## 2019-12-31 DIAGNOSIS — N951 Menopausal and female climacteric states: Secondary | ICD-10-CM

## 2019-12-31 DIAGNOSIS — H8110 Benign paroxysmal vertigo, unspecified ear: Secondary | ICD-10-CM

## 2019-12-31 MED ORDER — FEXOFENADINE HCL 180 MG PO TABS: 180.0000 mg | ORAL_TABLET | Freq: Every day | ORAL | Status: DC

## 2019-12-31 MED ORDER — MECLIZINE HCL 25 MG PO TABS: 25.0000 mg | ORAL_TABLET | Freq: Four times a day (QID) | ORAL | 0 refills | Status: AC | PRN

## 2019-12-31 NOTE — Patient Instructions (Signed)
Eustachian Tube Dysfunction  Eustachian tube dysfunction refers to a condition in which a blockage develops in the narrow passage that connects the middle ear to the back of the nose (eustachian tube). The eustachian tube regulates air pressure in the middle ear by letting air move between the ear and nose. It also helps to drain fluid from the middle ear space. Eustachian tube dysfunction can affect one or both ears. When the eustachian tube does not function properly, air pressure, fluid, or both can build up in the middle ear. What are the causes? This condition occurs when the eustachian tube becomes blocked or cannot open normally. Common causes of this condition include:  Ear infections.  Colds and other infections that affect the nose, mouth, and throat (upper respiratory tract).  Allergies.  Irritation from cigarette smoke.  Irritation from stomach acid coming up into the esophagus (gastroesophageal reflux). The esophagus is the tube that carries food from the mouth to the stomach.  Sudden changes in air pressure, such as from descending in an airplane or scuba diving.  Abnormal growths in the nose or throat, such as: ? Growths that line the nose (nasal polyps). ? Abnormal growth of cells (tumors). ? Enlarged tissue at the back of the throat (adenoids). What increases the risk? You are more likely to develop this condition if:  You smoke.  You are overweight.  You are a child who has: ? Certain birth defects of the mouth, such as cleft palate. ? Large tonsils or adenoids. What are the signs or symptoms? Common symptoms of this condition include:  A feeling of fullness in the ear.  Ear pain.  Clicking or popping noises in the ear.  Ringing in the ear.  Hearing loss.  Loss of balance.  Dizziness. Symptoms may get worse when the air pressure around you changes, such as when you travel to an area of high elevation, fly on an airplane, or go scuba diving. How is  this diagnosed? This condition may be diagnosed based on:  Your symptoms.  A physical exam of your ears, nose, and throat.  Tests, such as those that measure: ? The movement of your eardrum (tympanogram). ? Your hearing (audiometry). How is this treated? Treatment depends on the cause and severity of your condition.  In mild cases, you may relieve your symptoms by moving air into your ears. This is called "popping the ears."  In more severe cases, or if you have symptoms of fluid in your ears, treatment may include: ? Medicines to relieve congestion (decongestants). ? Medicines that treat allergies (antihistamines). ? Nasal sprays or ear drops that contain medicines that reduce swelling (steroids). ? A procedure to drain the fluid in your eardrum (myringotomy). In this procedure, a small tube is placed in the eardrum to:  Drain the fluid.  Restore the air in the middle ear space. ? A procedure to insert a balloon device through the nose to inflate the opening of the eustachian tube (balloon dilation). Follow these instructions at home: Lifestyle  Do not do any of the following until your health care provider approves: ? Travel to high altitudes. ? Fly in airplanes. ? Work in a pressurized cabin or room. ? Scuba dive.  Do not use any products that contain nicotine or tobacco, such as cigarettes and e-cigarettes. If you need help quitting, ask your health care provider.  Keep your ears dry. Wear fitted earplugs during showering and bathing. Dry your ears completely after. General instructions  Take over-the-counter   and prescription medicines only as told by your health care provider.  Use techniques to help pop your ears as recommended by your health care provider. These may include: ? Chewing gum. ? Yawning. ? Frequent, forceful swallowing. ? Closing your mouth, holding your nose closed, and gently blowing as if you are trying to blow air out of your nose.  Keep all  follow-up visits as told by your health care provider. This is important. Contact a health care provider if:  Your symptoms do not go away after treatment.  Your symptoms come back after treatment.  You are unable to pop your ears.  You have: ? A fever. ? Pain in your ear. ? Pain in your head or neck. ? Fluid draining from your ear.  Your hearing suddenly changes.  You become very dizzy.  You lose your balance. Summary  Eustachian tube dysfunction refers to a condition in which a blockage develops in the eustachian tube.  It can be caused by ear infections, allergies, inhaled irritants, or abnormal growths in the nose or throat.  Symptoms include ear pain, hearing loss, or ringing in the ears.  Mild cases are treated with maneuvers to unblock the ears, such as yawning or ear popping.  Severe cases are treated with medicines. Surgery may also be done (rare). This information is not intended to replace advice given to you by your health care provider. Make sure you discuss any questions you have with your health care provider. Document Revised: 10/15/2017 Document Reviewed: 10/15/2017 Elsevier Patient Education  2020 [employer] Benign Positional Vertigo Vertigo is the feeling that you or your surroundings are moving when they are not. Benign positional vertigo is the most common form of vertigo. This is usually a harmless condition (benign). This condition is positional. This means that symptoms are triggered by certain movements and positions. This condition can be dangerous if it occurs while you are doing something that could cause harm to you or others. This includes activities such as driving or operating machinery. What are the causes? In many cases, the cause of this condition is not known. It may be caused by a disturbance in an area of the inner ear that helps your brain to sense movement and balance. This disturbance can be caused by:  Viral infection  (labyrinthitis).  Head injury.  Repetitive motion, such as jumping, dancing, or running. What increases the risk? You are more likely to develop this condition if:  You are a woman.  You are 55 years of age or older. What are the signs or symptoms? Symptoms of this condition usually happen when you move your head or your eyes in different directions. Symptoms may start suddenly, and usually last for less than a minute. They include:  Loss of balance and falling.  Feeling like you are spinning or moving.  Feeling like your surroundings are spinning or moving.  Nausea and vomiting.  Blurred vision.  Dizziness.  Involuntary eye movement (nystagmus). Symptoms can be mild and cause only minor problems, or they can be severe and interfere with daily life. Episodes of benign positional vertigo may return (recur) over time. Symptoms may improve over time. How is this diagnosed? This condition may be diagnosed based on:  Your medical history.  Physical exam of the head, neck, and ears.  Tests, such as: ? MRI. ? CT scan. ? Eye movement tests. Your health care provider may ask you to change positions quickly while he or she watches you for symptoms of  benign positional vertigo, such as nystagmus. Eye movement may be tested with a variety of exams that are designed to evaluate or stimulate vertigo. ? An electroencephalogram (EEG). This records electrical activity in your brain. ? Hearing tests. You may be referred to a health care provider who specializes in ear, nose, and throat (ENT) problems (otolaryngologist) or a provider who specializes in disorders of the nervous system (neurologist). How is this treated?  This condition may be treated in a session in which your health care provider moves your head in specific positions to adjust your inner ear back to normal. Treatment for this condition may take several sessions. Surgery may be needed in severe cases, but this is rare. In  some cases, benign positional vertigo may resolve on its own in 2-4 weeks. Follow these instructions at home: Safety  Move slowly. Avoid sudden body or head movements or certain positions, as told by your health care provider.  Avoid driving until your health care provider says it is safe for you to do so.  Avoid operating heavy machinery until your health care provider says it is safe for you to do so.  Avoid doing any tasks that would be dangerous to you or others if vertigo occurs.  If you have trouble walking or keeping your balance, try using a cane for stability. If you feel dizzy or unstable, sit down right away.  Return to your normal activities as told by your health care provider. Ask your health care provider what activities are safe for you. General instructions  Take over-the-counter and prescription medicines only as told by your health care provider.  Drink enough fluid to keep your urine pale yellow.  Keep all follow-up visits as told by your health care provider. This is important. Contact a health care provider if:  You have a fever.  Your condition gets worse or you develop new symptoms.  Your family or friends notice any behavioral changes.  You have nausea or vomiting that gets worse.  You have numbness or a "pins and needles" sensation. Get help right away if you:  Have difficulty speaking or moving.  Are always dizzy.  Faint.  Develop severe headaches.  Have weakness in your legs or arms.  Have changes in your hearing or vision.  Develop a stiff neck.  Develop sensitivity to light. Summary  Vertigo is the feeling that you or your surroundings are moving when they are not. Benign positional vertigo is the most common form of vertigo.  The cause of this condition is not known. It may be caused by a disturbance in an area of the inner ear that helps your brain to sense movement and balance.  Symptoms include loss of balance and falling,  feeling that you or your surroundings are moving, nausea and vomiting, and blurred vision.  This condition can be diagnosed based on symptoms, physical exam, and other tests, such as MRI, CT scan, eye movement tests, and hearing tests.  Follow safety instructions as told by your health care provider. You will also be told when to contact your health care provider in case of problems. This information is not intended to replace advice given to you by your health care provider. Make sure you discuss any questions you have with your health care provider. Document Revised: 12/04/2017 Document Reviewed: 12/04/2017 Elsevier Patient Education  2020 [employer]. Allergic Rhinitis, Adult Allergic rhinitis is an allergic reaction that affects the mucous membrane inside the nose. It causes sneezing, a runny or  stuffy nose, and the feeling of mucus going down the back of the throat (postnasal drip). Allergic rhinitis can be mild to severe. There are two types of allergic rhinitis:  Seasonal. This type is also called hay fever. It happens only during certain seasons.  Perennial. This type can happen at any time of the year. What are the causes? This condition happens when the body's defense system (immune system) responds to certain harmless substances called allergens as though they were germs.  Seasonal allergic rhinitis is triggered by pollen, which can come from grasses, trees, and weeds. Perennial allergic rhinitis may be caused by:  House dust mites.  Pet dander.  Mold spores. What are the signs or symptoms? Symptoms of this condition include:  Sneezing.  Runny or stuffy nose (nasal congestion).  Postnasal drip.  Itchy nose.  Tearing of the eyes.  Trouble sleeping.  Daytime sleepiness. How is this diagnosed? This condition may be diagnosed based on:  Your medical history.  A physical exam.  Tests to check for related conditions, such as: ? Asthma. ? Pink eye. ? Ear  infection. ? Upper respiratory infection.  Tests to find out which allergens trigger your symptoms. These may include skin or blood tests. How is this treated? There is no cure for this condition, but treatment can help control symptoms. Treatment may include:  Taking medicines that block allergy symptoms, such as antihistamines. Medicine may be given as a shot, nasal spray, or pill.  Avoiding the allergen.  Desensitization. This treatment involves getting ongoing shots until your body becomes less sensitive to the allergen. This treatment may be done if other treatments do not help.  If taking medicine and avoiding the allergen does not work, new, stronger medicines may be prescribed. Follow these instructions at home:  Find out what you are allergic to. Common allergens include smoke, dust, and pollen.  Avoid the things you are allergic to. These are some things you can do to help avoid allergens: ? Replace carpet with wood, tile, or vinyl flooring. Carpet can trap dander and dust. ? Do not smoke. Do not allow smoking in your home. ? Change your heating and air conditioning filter at least once a month. ? During allergy season:  Keep windows closed as much as possible.  Plan outdoor activities when pollen counts are lowest. This is usually during the evening hours.  When coming indoors, change clothing and shower before sitting on furniture or bedding.  Take over-the-counter and prescription medicines only as told by your health care provider.  Keep all follow-up visits as told by your health care provider. This is important. Contact a health care provider if:  You have a fever.  You develop a persistent cough.  You make whistling sounds when you breathe (you wheeze).  Your symptoms interfere with your normal daily activities. Get help right away if:  You have shortness of breath. Summary  This condition can be managed by taking medicines as directed and avoiding  allergens.  Contact your health care provider if you develop a persistent cough or fever.  During allergy season, keep windows closed as much as possible. This information is not intended to replace advice given to you by your health care provider. Make sure you discuss any questions you have with your health care provider. Document Revised: 06/07/2017 Document Reviewed: 08/02/2016 Elsevier Patient Education  2020 [employer]. How to Perform a Sinus Rinse A sinus rinse is a home treatment that is used to rinse your sinuses  with a sterile mixture of salt and water (saline solution). Sinuses are air-filled spaces in your skull behind the bones of your face and forehead that open into your nasal cavity. A sinus rinse can help to clear mucus, dirt, dust, or pollen from your nasal cavity. You may do a sinus rinse when you have a cold, a virus, nasal allergy symptoms, a sinus infection, or stuffiness in your nose or sinuses. Talk with your health care provider about whether a sinus rinse might help you. What are the risks? A sinus rinse is generally safe and effective. However, there are a few risks, which include:  A burning sensation in your sinuses. This may happen if you do not make the saline solution as directed. Be sure to follow all directions when making the saline solution.  Nasal irritation.  Infection from contaminated water. This is rare, but possible. Do not do a sinus rinse if you have had ear or nasal surgery, ear infection, or blocked ears. Supplies needed:  Saline solution or powder.  Distilled or sterile water may be needed to mix with saline powder. ? You may use boiled and cooled tap water. Boil tap water for 5 minutes; cool until it is lukewarm. Use within 24 hours. ? Do not use regular tap water to mix with the saline solution.  Neti pot or nasal rinse bottle. These supplies release the saline solution into your nose and through your sinuses. Neti pots and nasal rinse  bottles can be purchased at [employer], a health food store, or online. How to perform a sinus rinse  1. Wash your hands with soap and water. 2. Wash your device according to the directions that came with the product and then dry it. 3. Use the solution that comes with your product or one that is sold separately in stores. Follow the mixing directions on the package if you need to mix with sterile or distilled water. 4. Fill the device with the amount of saline solution noted in the device instructions. 5. Stand over a sink and tilt your head sideways over the sink. 6. Place the spout of the device in your upper nostril (the one closer to the ceiling). 7. Gently pour or squeeze the saline solution into your nasal cavity. The liquid should drain out from the lower nostril if you are not too congested. 8. While rinsing, breathe through your open mouth. 9. Gently blow your nose to clear any mucus and rinse solution. Blowing too hard may cause ear pain. 10. Repeat in your other nostril. 11. Clean and rinse your device with clean water and then air-dry it. Talk with your health care provider or pharmacist if you have questions about how to do a sinus rinse. Summary  A sinus rinse is a home treatment that is used to rinse your sinuses with a sterile mixture of salt and water (saline solution).  A sinus rinse is generally safe and effective. Follow all instructions carefully.  Before doing a sinus rinse, talk with your health care provider about whether it would be helpful for you. This information is not intended to replace advice given to you by your health care provider. Make sure you discuss any questions you have with your health care provider. Document Revised: 04/22/2017 Document Reviewed: 04/22/2017 Elsevier Patient Education  2020 [employer]. Menopause Menopause is the normal time of life when menstrual periods stop completely. It is usually confirmed by 12 months without a  menstrual period. The transition to menopause (  perimenopause) most often happens between the ages of 45 and 50. During perimenopause, hormone levels change in your body, which can cause symptoms and affect your health. Menopause may increase your risk for:  Loss of bone (osteoporosis), which causes bone breaks (fractures).  Depression.  Hardening and narrowing of the arteries (atherosclerosis), which can cause heart attacks and strokes. What are the causes? This condition is usually caused by a natural change in hormone levels that happens as you get older. The condition may also be caused by surgery to remove both ovaries (bilateral oophorectomy). What increases the risk? This condition is more likely to start at an earlier age if you have certain medical conditions or treatments, including:  A tumor of the pituitary gland in the brain.  A disease that affects the ovaries and hormone production.  Radiation treatment for cancer.  Certain cancer treatments, such as chemotherapy or hormone (anti-estrogen) therapy.  Heavy smoking and excessive alcohol use.  Family history of early menopause. This condition is also more likely to develop earlier in women who are very thin. What are the signs or symptoms? Symptoms of this condition include:  Hot flashes.  Irregular menstrual periods.  Night sweats.  Changes in feelings about sex. This could be a decrease in sex drive or an increased comfort around your sexuality.  Vaginal dryness and thinning of the vaginal walls. This may cause painful intercourse.  Dryness of the skin and development of wrinkles.  Headaches.  Problems sleeping (insomnia).  Mood swings or irritability.  Memory problems.  Weight gain.  Hair growth on the face and chest.  Bladder infections or problems with urinating. How is this diagnosed? This condition is diagnosed based on your medical history, a physical exam, your age, your menstrual history, and  your symptoms. Hormone tests may also be done. How is this treated? In some cases, no treatment is needed. You and your health care provider should make a decision together about whether treatment is necessary. Treatment will be based on your individual condition and preferences. Treatment for this condition focuses on managing symptoms. Treatment may include:  Menopausal hormone therapy (MHT).  Medicines to treat specific symptoms or complications.  Acupuncture.  Vitamin or herbal supplements. Before starting treatment, make sure to let your health care provider know if you have a personal or family history of:  Heart disease.  Breast cancer.  Blood clots.  Diabetes.  Osteoporosis. Follow these instructions at home: Lifestyle  Do not use any products that contain nicotine or tobacco, such as cigarettes and e-cigarettes. If you need help quitting, ask your health care provider.  Get at least 30 minutes of physical activity on 5 or more days each week.  Avoid alcoholic and caffeinated beverages, as well as spicy foods. This may help prevent hot flashes.  Get 7-8 hours of sleep each night.  If you have hot flashes, try: ? Dressing in layers. ? Avoiding things that may trigger hot flashes, such as spicy food, warm places, or stress. ? Taking slow, deep breaths when a hot flash starts. ? Keeping a fan in your home and office.  Find ways to manage stress, such as deep breathing, meditation, or journaling.  Consider going to group therapy with other women who are having menopause symptoms. Ask your health care provider about recommended group therapy meetings. Eating and drinking  Eat a healthy, balanced diet that contains whole grains, lean protein, low-fat dairy, and plenty of fruits and vegetables.  Your health care provider may recommend  adding more soy to your diet. Foods that contain soy include tofu, tempeh, and soy milk.  Eat plenty of foods that contain calcium and  vitamin D for bone health. Items that are rich in calcium include low-fat milk, yogurt, beans, almonds, sardines, broccoli, and kale. Medicines  Take over-the-counter and prescription medicines only as told by your health care provider.  Talk with your health care provider before starting any herbal supplements. If prescribed, take vitamins and supplements as told by your health care provider. These may include: ? Calcium. Women age 32 and older should get 1,200 mg (milligrams) of calcium every day. ? Vitamin D. Women need 600-800 International Units of vitamin D each day. ? Vitamins B12 and B6. Aim for 50 micrograms of B12 and 1.5 mg of B6 each day. General instructions  Keep track of your menstrual periods, including: ? When they occur. ? How heavy they are and how long they last. ? How much time passes between periods.  Keep track of your symptoms, noting when they start, how often you have them, and how long they last.  Use vaginal lubricants or moisturizers to help with vaginal dryness and improve comfort during sex.  Keep all follow-up visits as told by your health care provider. This is important. This includes any group therapy or counseling. Contact a health care provider if:  You are still having menstrual periods after age 66.  You have pain during sex.  You have not had a period for 12 months and you develop vaginal bleeding. Get help right away if:  You have: ? Severe depression. ? Excessive vaginal bleeding. ? Pain when you urinate. ? A fast or irregular heart beat (palpitations). ? Severe headaches. ? Abdomen (abdominal) pain or severe indigestion.  You fell and you think you have a broken bone.  You develop leg or chest pain.  You develop vision problems.  You feel a lump in your breast. Summary  Menopause is the normal time of life when menstrual periods stop completely. It is usually confirmed by 12 months without a menstrual period.  The transition  to menopause (perimenopause) most often happens between the ages of 75 and 32.  Symptoms can be managed through medicines, lifestyle changes, and complementary therapies such as acupuncture.  Eat a balanced diet that is rich in nutrients to promote bone health and heart health and to manage symptoms during menopause. This information is not intended to replace advice given to you by your health care provider. Make sure you discuss any questions you have with your health care provider. Document Revised: 06/07/2017 Document Reviewed: 07/28/2016 Elsevier Patient Education  2020 [employer].

## 2019-12-31 NOTE — Progress Notes (Signed)
Subjective:    Patient ID: Christine Rojas, female    DOB: 1966/01/26, 54 y.o.   MRN: [id number]  53y/o Caucasian established female pt c/o dizziness and nausea. Called out of work yesterday due to sx. "I couldn't walk in a straight line yesterday." Room spinning sensation and nausea worsen with certain head movements.  Used OTC dramamine at home yesterday but causes drowsiness so cannot use when working.  Took allegra 180mg  po this morning instead definitely helping with dizzyness much improved today.  Had started to use fan at home in bedroom due to menopause hot flashes at night wondering if that flared up her seasonal allergies and vertigo.  Stopped her venlafaxine PCM prescribed for hot flashes didn't note enough benefit.  Has not been using flonase/nasal saline/antihistamine on regular basis since spring bloom ended.  Denied arm/leg weakness, dyphagia, dysphasia, ear discharge, sinus pain or ear pain.  Noted some nasal congestion yesterday.     Review of Systems  Constitutional: Positive for diaphoresis. Negative for activity change, appetite change, chills, fatigue and fever.  HENT: Positive for congestion. Negative for dental problem, drooling, ear discharge, ear pain, facial swelling, hearing loss, mouth sores, nosebleeds, postnasal drip, rhinorrhea, sinus pressure, sinus pain, sneezing, sore throat, tinnitus, trouble swallowing and voice change.   Eyes: Negative for photophobia and visual disturbance.  Respiratory: Negative for cough, shortness of breath, wheezing and stridor.   Cardiovascular: Negative for chest pain.  Gastrointestinal: Negative for abdominal pain, diarrhea, nausea and vomiting.  Endocrine: Negative for cold intolerance and heat intolerance.  Genitourinary: Negative for enuresis.  Musculoskeletal: Negative for neck pain and neck stiffness.  Skin: Negative for rash.  Allergic/Immunologic: Positive for environmental allergies. Negative for food allergies.  Neurological:  Positive for dizziness. Negative for tremors, seizures, syncope, facial asymmetry, speech difficulty, weakness, light-headedness and numbness.  Hematological: Negative for adenopathy. Does not bruise/bleed easily.  Psychiatric/Behavioral: Negative for agitation, confusion and sleep disturbance.       Objective:   Physical Exam Vitals and nursing note reviewed.  Constitutional:      General: She is awake. She is not in acute distress.    Appearance: Normal appearance. She is well-developed and well-groomed. She is obese. She is not ill-appearing, toxic-appearing or diaphoretic.  HENT:     Head: Normocephalic and atraumatic.     Jaw: There is normal jaw occlusion. No trismus.     Salivary Glands: Right salivary gland is not diffusely enlarged or tender. Left salivary gland is not diffusely enlarged or tender.     Right Ear: Hearing, ear canal and external ear normal. A middle ear effusion is present.     Left Ear: Hearing, ear canal and external ear normal. A middle ear effusion is present.     Nose: Mucosal edema and congestion present. No nasal deformity, septal deviation, laceration or rhinorrhea.     Right Nostril: No epistaxis.     Left Nostril: No epistaxis.     Right Turbinates: Enlarged and swollen. Not pale.     Left Turbinates: Enlarged and swollen. Not pale.     Right Sinus: Maxillary sinus tenderness and frontal sinus tenderness present.     Left Sinus: Maxillary sinus tenderness and frontal sinus tenderness present.     Comments: Mild sinus tenderness with palpation bilaterally frontal and maxillary; bilateral TMs air fluid level clear; bilateral allergic shiners; bilateral nasal turbinates with edema erythema clear discharge; cobblestoning posterior pharynx    Mouth/Throat:     Mouth: Mucous membranes are  moist. Mucous membranes are not pale, not dry and not cyanotic. No lacerations or oral lesions.     Dentition: Normal dentition. Does not have dentures. No dental caries or  dental abscesses.     Pharynx: Uvula midline. Posterior oropharyngeal erythema present. No oropharyngeal exudate or uvula swelling.     Tonsils: No tonsillar abscesses.  Eyes:     General: Lids are normal. Vision grossly intact. Gaze aligned appropriately. Allergic shiner present. No visual field deficit or scleral icterus.       Right eye: No foreign body, discharge or hordeolum.        Left eye: No foreign body, discharge or hordeolum.     Extraocular Movements: Extraocular movements intact.     Right eye: Normal extraocular motion and no nystagmus.     Left eye: Normal extraocular motion and no nystagmus.     Conjunctiva/sclera: Conjunctivae normal.     Right eye: Right conjunctiva is not injected. No chemosis, exudate or hemorrhage.    Left eye: Left conjunctiva is not injected. No chemosis, exudate or hemorrhage.    Pupils: Pupils are equal, round, and reactive to light. Pupils are equal.     Right eye: Pupil is round and reactive.     Left eye: Pupil is round and reactive.  Neck:     Thyroid: No thyroid mass, thyromegaly or thyroid tenderness.     Trachea: Trachea and phonation normal. No tracheal tenderness or tracheal deviation.  Cardiovascular:     Rate and Rhythm: Normal rate and regular rhythm.     Chest Wall: PMI is not displaced.     Pulses: Normal pulses.          Radial pulses are 2+ on the right side and 2+ on the left side.  Pulmonary:     Effort: Pulmonary effort is normal. No accessory muscle usage or respiratory distress.     Breath sounds: Normal breath sounds and air entry. No stridor, decreased air movement or transmitted upper airway sounds. No decreased breath sounds, wheezing, rhonchi or rales.     Comments: Wearing disposable surgical mask due to covid 19 pandemic; spoke full sentences without difficulty; no cough observed in exam room Chest:     Chest wall: No tenderness.  Abdominal:     General: There is no distension.     Palpations: Abdomen is soft.   Musculoskeletal:        General: No tenderness. Normal range of motion.     Right shoulder: Normal.     Left shoulder: Normal.     Right elbow: Normal.     Left elbow: Normal.     Right hand: Normal.     Left hand: Normal.     Cervical back: Normal, normal range of motion and neck supple. No swelling, edema, deformity, erythema, signs of trauma, lacerations, rigidity, torticollis or crepitus. No pain with movement, spinous process tenderness or muscular tenderness. Normal range of motion.     Thoracic back: Normal.     Lumbar back: Normal.     Right hip: Normal.     Left hip: Normal.     Right knee: Normal.     Left knee: Normal.  Lymphadenopathy:     Head:     Right side of head: No submental, submandibular, tonsillar, preauricular, posterior auricular or occipital adenopathy.     Left side of head: No submental, submandibular, tonsillar, preauricular, posterior auricular or occipital adenopathy.     Cervical: No cervical adenopathy.  Right cervical: No superficial, deep or posterior cervical adenopathy.    Left cervical: No superficial, deep or posterior cervical adenopathy.  Skin:    General: Skin is warm and dry.     Capillary Refill: Capillary refill takes less than 2 seconds.     Coloration: Skin is not ashen, cyanotic, jaundiced, mottled, pale or sallow.     Findings: No abrasion, abscess, acne, bruising, burn, ecchymosis, erythema, signs of injury, laceration, lesion, petechiae, rash or wound.     Nails: There is no clubbing.  Neurological:     General: No focal deficit present.     Mental Status: She is alert and oriented to person, place, and time. Mental status is at baseline. She is not disoriented.     GCS: GCS eye subscore is 4. GCS verbal subscore is 5. GCS motor subscore is 6.     Cranial Nerves: Cranial nerves are intact. No cranial nerve deficit, dysarthria or facial asymmetry.     Sensory: Sensation is intact. No sensory deficit.     Motor: Motor function is  intact. No weakness, tremor, atrophy, abnormal muscle tone or seizure activity.     Coordination: Coordination is intact. Coordination normal.     Gait: Gait is intact. Gait normal.     Comments: Gait sure and steady in hallway; in/out of chair and on/off exam table without difficulty; bilateral hand grasp equal 5/5  Psychiatric:        Attention and Perception: Attention and perception normal.        Mood and Affect: Mood and affect normal.        Speech: Speech normal.        Behavior: Behavior normal. Behavior is cooperative.        Thought Content: Thought content normal.        Cognition and Memory: Cognition and memory normal.        Judgment: Judgment normal.           Assessment & Plan:  A-seasonal allergic rhinitis; eustachian tube dysfunction bilateral, BPPV and hot flashes due to menopause  P-Patient may use normal saline nasal spray 2 sprays each nostril q2h wa as needed given 1 bottle from clinic stock. flonase 1 spray each nostril BID OTC has [employer] membership plans to buy OTC.  Patient denied personal or family history of ENT cancer.  OTC antihistamine of choice may continue allegra 180mg  po daily.  Avoid triggers if possible.  Shower prior to bedtime if exposed to triggers.  If allergic dust/dust mites recommend mattress/pillow covers/encasements; washing linens, vacuuming, sweeping, dusting weekly.  Call or return to clinic as needed if these symptoms worsen or fail to improve as anticipated.   Exitcare handout on allergic rhinitis and sinus rinse printed and given to patient.  Patient verbalized understanding of instructions, agreed with plan of care and had no further questions at this time.    No evidence of invasive bacterial infection, non toxic and well hydrated.  I do not see where any further testing or imaging is necessary at this time.   I will suggest supportive care, rest, good hygiene and encourage the patient to take adequate fluids.  The patient is to return  to clinic or EMERGENCY ROOM if symptoms worsen or change significantly e.g. ear pain, fever, purulent discharge from ears or bleeding.  Exitcare handout on otitis media and eustachian tube dysfunction printed and given to patient.  Discussed with patient post nasal drip irritates throat/causes swelling blocks eustachian tubes  from draining and fluid fills up middle ear.  Bacteria/viruses can grow in fluid and with moving head tube compressed and increases pressure in tube/ear worsening pain.  Studies show will take 30 days for fluid to resolve after post nasal drip controlled with nasal steroid/antihistamine. Antibiotics and steroids do not speed up fluid removal.  Patient verbalized agreement and understanding of treatment plan and had no further questions at this time. P2:  Avoidance and hand washing.  Discussed with patient otitis media with effusion probably causing vertigo but could also be age. Meclizine 25mg  po prn helpful in the past. Supportive treatment may take up to 4 doses meclizine per day max 100mg  per 24 hours. Discussed signs/symptoms stroke.  Someone to drive her home recommended not driving during vertigo episodes. Follow up if aphasia, dysphasia, visual changes, weakness, fall, worst headache of life, incoordination, fever, ear discharge. Consider ENT evaluation/follow up with PCM if worsening symptoms not controlled with meclizine.  Exitcare handout on vertigo printed and given to patient.  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 Other option restart flonase and nasal saline use to decrease post nasal drip which worsens eustachian tube dysfunction/middle ear fluid build up.  Patient verbalized understanding of information/agreed with plan of care and had no further questions at this time.   Discussed wear layers or clothing. Fan use at home.  Consider mask wear to  help with allergies and/or medication for treatment of symptoms.   Patient has tried venlafaxine with minimal results. Discussed polyester clothing/sheets on bed may trap heat. Discussed cotton clothing/bed sheets, weight loss, phytoestrogens/soy, black cohosh per up to date guidelines.  Exitcare handout on menopause printed and given to patient.  Follow up with GYN/PCM.  Patient does not want oral/injectable hormones/SSRI/SNRIs at this time.  Hgba1c/glucose elevated labs 2 weeks ago could be contributing also.  Patient verbalized understanding information/instructions, agreed with plan of care and had no further questions at this time.

## 2020-02-02 ENCOUNTER — Encounter: Payer: Self-pay | Admitting: Registered Nurse

## 2020-02-02 ENCOUNTER — Telehealth: Payer: Self-pay | Admitting: Registered Nurse

## 2020-02-02 DIAGNOSIS — R7401 Elevation of levels of liver transaminase levels: Secondary | ICD-10-CM

## 2020-02-02 DIAGNOSIS — R748 Abnormal levels of other serum enzymes: Secondary | ICD-10-CM

## 2020-02-02 NOTE — Telephone Encounter (Signed)
Patient requested refill diclofenac DR 75mg  po BID prn chronic back/hip/knee pain #180 RF0 dispensed from [facility] to patient [employer] formulary.  Last labs 12/15/2019 renal and liver function stable ALT/GGT slightly elevated repeat nonfasting LFTs Dec 2021, weight loss recommended Jun 2021 as  13lbs weight gain since Oct 2020.  Labs sooner if changes in stool or abdomen pain. Take with food to decrease risk gastritis.  Patient also received refills lisinopril 10mg  po daily #90, atorvastatin 40mg  po daily #90, hydrochlorothiazide 25mg  po daily #90 , metformin 500mg  po qam #90 and metformin 1000mg  po qpm #90 RF0, omperazole DR 40mg  po daily #90 RF0

## 2020-05-24 ENCOUNTER — Encounter: Payer: Self-pay | Admitting: *Deleted

## 2020-05-24 ENCOUNTER — Telehealth: Payer: Self-pay | Admitting: *Deleted

## 2020-05-24 DIAGNOSIS — J209 Acute bronchitis, unspecified: Secondary | ICD-10-CM

## 2020-05-24 NOTE — Telephone Encounter (Signed)
Noted agree with plan of care.  Will extend to 10 day quarantine if covid test positive or symtoms not improving.

## 2020-05-24 NOTE — Telephone Encounter (Signed)
Pt was sent down to clinic yesterday approx 1215 reporting a non-productive, dry cough with throat clearing with mild sore throat. She felt sx were allergy related. She sts Hx of seasonal allergies and doesn't start meds until sx start, no regular maintenance routine. Per [facility] review, found community acquired PNA in Nov 2018, bronchitis and viral URI in the summertime 2019. She sts she typically home manages allergies and is not seen in clinic. Advised pt we will do a 2 day allergy trial with her restarting Allegra, nasal saline, Flonase. Advised to shower BID especially at night with nasal saline rinse, phenylephrine 5-10mg  po q4-6 prn nasal and sinus congestion provided from clinic OTC stock. She denied fever/chills, n/v/d, otalgia, sinus pain/pressure, loss of taste/smell. She is fully vaccinated.   Spoke with pt over phone this afternoon, just over 24 hours ago.  Reports now mildly productive cough, generally not feeling as well. Asked her husband to pick up home Covid test. Advised pt that isn't necessary right now as PCR test will be needed if home test is negative and likely to early to test as only Day 2 of sx. Notified pt that she is being moved to a 7 day quarantine with testing which RN scheduled pt for during phone call.   Last day on-site: 05/23/20 Day 1 of Sx: 05/23/20 Day 1 of quarantine: 05/23/20 (woke up with sx) Testing between sx days 3-5 Test scheduled 05/26/20 at 1000 at [facility] site) Day 7 quarantine: 05/29/20 RTW date: 05/30/20

## 2020-05-26 ENCOUNTER — Other Ambulatory Visit: Payer: PRIVATE HEALTH INSURANCE

## 2020-05-26 DIAGNOSIS — Z20822 Contact with and (suspected) exposure to covid-19: Secondary | ICD-10-CM

## 2020-05-27 LAB — SARS-COV-2, NAA 2 DAY TAT

## 2020-05-27 LAB — NOVEL CORONAVIRUS, NAA: SARS-CoV-2, NAA: NOT DETECTED

## 2020-05-29 MED ORDER — PREDNISONE 10 MG PO TABS: ORAL_TABLET | ORAL | 0 refills | Status: AC

## 2020-05-29 MED ORDER — DOXYCYCLINE HYCLATE 100 MG PO TABS: 100.0000 mg | ORAL_TABLET | Freq: Two times a day (BID) | ORAL | 0 refills | Status: AC

## 2020-05-29 MED ORDER — ALBUTEROL SULFATE HFA 108 (90 BASE) MCG/ACT IN AERS: 1.0000 | INHALATION_SPRAY | RESPIRATORY_TRACT | 0 refills | Status: DC | PRN

## 2020-05-29 NOTE — Telephone Encounter (Signed)
Telephone message left for patient negative covid test 05/26/2020 and as long as symptoms improving no new symptoms and no vomiting/diarrhea/fever/chills today she is cleared to return to work tomorrow 05/30/2020.  Patient can email me tonight if she wants me to call her back at [email or web address] or call RN Rolly Salter tomorrow [phone] during clinic hours.

## 2020-05-29 NOTE — Telephone Encounter (Signed)
Patient returned call reminded to use home number as no cell service at house.  Last bronchitis requiring steroids 2019 30mg  dose made patient feel funny and raise blood sugars.  Will try lower dose this course  prednisione 20mg  x 2 days and 10mg  x 2 days #21 RF0 from [facility] along with doxycycline 100mg  po BID x 10 days #20 RF0 as bad taste in mouth, brown colored mucous and having blood taste in mucous.  RN to see patient in clinic tomorrow 0830 check breath sounds/sp02 as patient reported chest tight/wheezing, her inhaler doesn't seem to be working too good that she had left over from previous illness.  Requested new Rx sent to [facility] discussed with patient we do not have on formulary in clinic.  Albuterol MDI 1-2 puffs po q4-6 hours prn cough protracted, wheezing or chest tightness #1 8gm RF0 sent to her pharmacy of choice electronically.  Discussed with patient pharmacy closed and does not reopen until 1000.  May continue OTC mucinex DM max and theraflu daily dosing am/pm prn.  Tessalon pearles not helpful in the past. Doesn't think she received bromphed DM from [facility] in the past.  Wondering if the flu vaccination could make her sick with flu symptoms discussed it is a killed virus for injection used at [facility] but dosing typically given during flu season so possible she was exposed to flu in community prior to vaccination administration.  Discussed in community non covid and covid viral illnesses circulating and lasting 7-14 days at this time also this year.   Hydrate, continue flonase/nasal saline/allegra use.  Patient feels able to go into work denied fever/chills/vomiting/diarrhea.  Discussed if loaner sp02 monitor available tomorrow RN [employer] will send one home with her to use this week also.  Patient verbalized understanding information/instructions, agreed with plan of care and had no further questions at this time.

## 2020-05-30 NOTE — Telephone Encounter (Signed)
Noted VSS, wheeze hasn't been able to pick up new albuterol inhaler and took first doses doxycycline and prednisone in clinic with RN Rolly Salter at 1235.  Will follow up with patient tomorrow when in clinic.

## 2020-05-30 NOTE — Telephone Encounter (Signed)
Pt took first doses of Prednisone and Doxy in clinic now at 1235 after lunch. 98% O2, p91. BP 126/86. Lung sounds CTA on left. Intermittent expiratory wheeze R upper and mid lobes. Going to pick up Albuterol inh this afternoon after work. Pulse oximeter loaned to pt. Reviewed use and normal values.

## 2020-05-31 MED ORDER — SALINE SPRAY 0.65 % NA SOLN: 2.0000 | NASAL | 0 refills | Status: DC

## 2020-05-31 MED ORDER — FLUCONAZOLE 150 MG PO TABS: 150.0000 mg | ORAL_TABLET | Freq: Once | ORAL | 0 refills | Status: AC

## 2020-05-31 NOTE — Telephone Encounter (Signed)
Patient seen in workcenter today sp02 RA 96% RA HR 93.  Wheezing throughout all lung fields last inhaler use at home and has not picked up her new inhaler from the pharmacy yet.  She reported she is feeling better.  Respirations even and unlabored, skin warm dry and pink, gait sure and steady in clinic spoke full sentences without difficulty.  Will follow up with patient again next week.  Patient to use her inhaler at least morning and night and prn if she notices wheezing/chest tightness/shortness of breath.  Patient asked that diflucan 150mg  po x 1 be called into her pharmacy for yeast infection secondary to antibiotics.  Electronic Rx sent to her pharmacy of choice.  Patient verbalized understanding information/instructions, agreed with plan of care and had no further questions at this time.

## 2020-06-01 NOTE — Telephone Encounter (Signed)
Message received insurance not covering inhaler and generic Rx requested by patient.  Will follow up with pharmacist as they do not open until 0900 per website.  Telephone message left for pharmacy staff to contact me on personal cell today as clinic closed and working from home.  Telephone message left for patient awaiting pharmacy to open and speak with staff regarding her albuterol Rx.

## 2020-06-01 NOTE — Telephone Encounter (Signed)
Patient contacted via telephone after speaking with [facility] pharmacist who stated 8g albuterol inhaler is $10 copay for patient and no new orders required.  Patient reported she last used inhaler (old) 30 minutes ago had noticed some wheezing today and feeling a little tired but cough improved not as productive or frequent. She reported counter on old inhaler reads 71. She took 10mg  prednisone today hadn't checked her sp02 yet today.  Prednisone keeping her up later than usual typically in bed 9pm and was up until 11pm last night.  Hasn't checked her blood sugar this week  Patient plans to use her inhaler again tonight before bed.  Discussed pharmacy open until 9pm in [facility].  Probably closed tomorrow.  Patient stated she would pick up her new inhaler Friday when she has to go to work.  Asked patient to check her sp02 and read 98% on room air.  Patient to email me her sp02 reading if close to 90 before using inhaler and to let me know what number is 30 minutes after inhaler use.  Discussed may need to keep at 20mg  prednisone level until she picks up new inhaler if sp02 dropping close to 90% between albuterol MDI use.  Patient has enough leftover pills of prednisone to do so in her [facility] bottle.  Discussed cold air and the low humidity hot air coming from HVAC can affect her breathing also.  Patient continuing doxycycline 100mg  BID.  A&Ox3 Respirations even and unlabored on telephone call, no cough audible during 10 minute conversation and spoke full sentences without difficulty.

## 2020-06-04 NOTE — Telephone Encounter (Signed)
Telephone message left for patient following up to see if symptoms improved with plan of care and if new inhaler working better than old one.

## 2020-06-05 NOTE — Telephone Encounter (Signed)
Patient feeling well today. New inhaler working better than old one and taking prednisone 10mg  today without adverse effects.  Denied concerns or questions at this time.  Spoke full sentences without difficulty, strong voice, no cough/nasal sniffing or throat clearing during telephone conversation.

## 2020-06-21 ENCOUNTER — Other Ambulatory Visit: Payer: Self-pay

## 2020-06-21 ENCOUNTER — Ambulatory Visit: Payer: Self-pay | Admitting: *Deleted

## 2020-06-21 DIAGNOSIS — Z0000 Encounter for general adult medical examination without abnormal findings: Secondary | ICD-10-CM

## 2020-06-21 NOTE — Progress Notes (Signed)
Fasting labs ahead of pcp CPE 12/16. Will route to pcp upon receipt.

## 2020-06-22 LAB — CMP12+LP+TP+TSH+6AC+CBC/D/PLT
ALT: 39 IU/L — ABNORMAL HIGH (ref 0–32)
AST: 21 IU/L (ref 0–40)
Albumin/Globulin Ratio: 1.8 (ref 1.2–2.2)
Albumin: 4.4 g/dL (ref 3.8–4.9)
Alkaline Phosphatase: 104 IU/L (ref 44–121)
BUN/Creatinine Ratio: 21 (ref 9–23)
BUN: 18 mg/dL (ref 6–24)
Basophils Absolute: 0 10*3/uL (ref 0.0–0.2)
Basos: 0 %
Bilirubin Total: 0.2 mg/dL (ref 0.0–1.2)
Calcium: 9.1 mg/dL (ref 8.7–10.2)
Chloride: 96 mmol/L (ref 96–106)
Chol/HDL Ratio: 3.6 ratio (ref 0.0–4.4)
Cholesterol, Total: 195 mg/dL (ref 100–199)
Creatinine, Ser: 0.84 mg/dL (ref 0.57–1.00)
EOS (ABSOLUTE): 0.5 10*3/uL — ABNORMAL HIGH (ref 0.0–0.4)
Eos: 7 %
Estimated CHD Risk: 0.6 times avg. (ref 0.0–1.0)
Free Thyroxine Index: 2.3 (ref 1.2–4.9)
GFR calc Af Amer: 90 mL/min/{1.73_m2} (ref >59)
GFR calc non Af Amer: 78 mL/min/{1.73_m2} (ref >59)
GGT: 57 IU/L (ref 0–60)
Globulin, Total: 2.4 g/dL (ref 1.5–4.5)
Glucose: 172 mg/dL — ABNORMAL HIGH (ref 65–99)
HDL: 54 mg/dL (ref >39)
Hematocrit: 43.3 % (ref 34.0–46.6)
Hemoglobin: 14.3 g/dL (ref 11.1–15.9)
Immature Grans (Abs): 0 10*3/uL (ref 0.0–0.1)
Immature Granulocytes: 0 %
Iron: 66 ug/dL (ref 27–159)
LDH: 169 IU/L (ref 119–226)
LDL Chol Calc (NIH): 110 mg/dL — ABNORMAL HIGH (ref 0–99)
Lymphocytes Absolute: 1.7 10*3/uL (ref 0.7–3.1)
Lymphs: 27 %
MCH: 27.4 pg (ref 26.6–33.0)
MCHC: 33 g/dL (ref 31.5–35.7)
MCV: 83 fL (ref 79–97)
Monocytes Absolute: 0.5 10*3/uL (ref 0.1–0.9)
Monocytes: 8 %
Neutrophils Absolute: 3.6 10*3/uL (ref 1.4–7.0)
Neutrophils: 58 %
Phosphorus: 3.9 mg/dL (ref 3.0–4.3)
Platelets: 367 10*3/uL (ref 150–450)
Potassium: 4.5 mmol/L (ref 3.5–5.2)
RBC: 5.22 x10E6/uL (ref 3.77–5.28)
RDW: 13 % (ref 11.7–15.4)
Sodium: 134 mmol/L (ref 134–144)
T3 Uptake Ratio: 27 % (ref 24–39)
T4, Total: 8.5 ug/dL (ref 4.5–12.0)
TSH: 1.01 u[IU]/mL (ref 0.450–4.500)
Total Protein: 6.8 g/dL (ref 6.0–8.5)
Triglycerides: 176 mg/dL — ABNORMAL HIGH (ref 0–149)
Uric Acid: 6.4 mg/dL (ref 3.0–7.2)
VLDL Cholesterol Cal: 31 mg/dL (ref 5–40)
WBC: 6.3 10*3/uL (ref 3.4–10.8)

## 2020-06-22 LAB — HGB A1C W/O EAG: Hgb A1c MFr Bld: 7.5 % — ABNORMAL HIGH (ref 4.8–5.6)

## 2020-09-19 ENCOUNTER — Ambulatory Visit: Payer: Self-pay | Admitting: *Deleted

## 2020-09-19 ENCOUNTER — Other Ambulatory Visit: Payer: Self-pay

## 2020-09-19 DIAGNOSIS — E119 Type 2 diabetes mellitus without complications: Secondary | ICD-10-CM

## 2020-09-19 NOTE — Progress Notes (Signed)
Routine q68mo A1c per pcp. Appt later this week.

## 2020-09-20 LAB — HGB A1C W/O EAG: Hgb A1c MFr Bld: 7.1 % — ABNORMAL HIGH (ref 4.8–5.6)

## 2020-09-20 NOTE — Progress Notes (Addendum)
Results reviewed with pt by phone. A1c improving. Routed result to pcp per pt request. She will come by clinic for hard copy of results.

## 2020-11-13 ENCOUNTER — Telehealth: Payer: Self-pay | Admitting: Registered Nurse

## 2020-11-13 DIAGNOSIS — E1165 Type 2 diabetes mellitus with hyperglycemia: Secondary | ICD-10-CM | POA: Insufficient documentation

## 2020-11-13 DIAGNOSIS — L680 Hirsutism: Secondary | ICD-10-CM | POA: Insufficient documentation

## 2020-11-13 DIAGNOSIS — R748 Abnormal levels of other serum enzymes: Secondary | ICD-10-CM | POA: Insufficient documentation

## 2020-11-13 DIAGNOSIS — K219 Gastro-esophageal reflux disease without esophagitis: Secondary | ICD-10-CM | POA: Insufficient documentation

## 2020-11-13 DIAGNOSIS — E559 Vitamin D deficiency, unspecified: Secondary | ICD-10-CM | POA: Insufficient documentation

## 2020-11-13 DIAGNOSIS — J309 Allergic rhinitis, unspecified: Secondary | ICD-10-CM | POA: Insufficient documentation

## 2020-11-13 DIAGNOSIS — F4320 Adjustment disorder, unspecified: Secondary | ICD-10-CM | POA: Insufficient documentation

## 2020-11-13 DIAGNOSIS — E782 Mixed hyperlipidemia: Secondary | ICD-10-CM | POA: Insufficient documentation

## 2020-11-13 DIAGNOSIS — E1169 Type 2 diabetes mellitus with other specified complication: Secondary | ICD-10-CM | POA: Insufficient documentation

## 2020-11-13 DIAGNOSIS — R7401 Elevation of levels of liver transaminase levels: Secondary | ICD-10-CM

## 2020-11-13 DIAGNOSIS — N951 Menopausal and female climacteric states: Secondary | ICD-10-CM | POA: Insufficient documentation

## 2020-11-15 ENCOUNTER — Encounter: Payer: Self-pay | Admitting: Registered Nurse

## 2020-11-15 ENCOUNTER — Ambulatory Visit: Payer: Self-pay | Admitting: Registered Nurse

## 2020-11-15 ENCOUNTER — Other Ambulatory Visit: Payer: Self-pay

## 2020-11-15 VITALS — BP 110/80 | HR 74 | Temp 98.5°F

## 2020-11-15 DIAGNOSIS — S60222A Contusion of left hand, initial encounter: Secondary | ICD-10-CM

## 2020-11-15 MED ORDER — SALINE SPRAY 0.65 % NA SOLN: 2.0000 | NASAL | 0 refills | Status: DC

## 2020-11-15 NOTE — Patient Instructions (Signed)
Hematoma A hematoma is a collection of blood under the skin, in an organ, in a body space, in a joint space, or in other tissue. The blood can thicken (clot) to form a lump that you can see and feel. The lump is often firm and may become sore and tender. Most hematomas get better in a few days to weeks. However, some hematomas may be serious and require medical care. Hematomas can range from very small to very large. What are the causes? This condition is caused by:  A blunt or penetrating injury.  A leakage from a blood vessel under the skin.  Some medical procedures, including surgeries, such as oral surgery, face lifts, and surgeries on the joints.  Some medical conditions that cause bleeding or bruising. There may be multiple hematomas that appear in different areas of the body. What increases the risk? You are more likely to develop this condition if:  You are an older adult.  You use blood thinners. What are the signs or symptoms? Symptoms of this condition depend on where the hematoma is located.  Common symptoms of a hematoma that is under the skin include:  A firm lump on the body.  Pain and tenderness in the area.  Bruising. Blue, dark blue, purple-red, or yellowish skin (discoloration) may appear at the site of the hematoma if the hematoma is close to the surface of the skin. Common symptoms of a hematoma that is deep in the tissues or body spaces may be less obvious. They include:  A collection of blood in the stomach (intra-abdominal hematoma). This may cause pain in the abdomen, weakness, fainting, and shortness of breath.  A collection of blood in the head (intracranial hematoma). This may cause a headache or symptoms such as weakness, trouble speaking or understanding, or a change in consciousness.   How is this diagnosed? This condition is diagnosed based on:  Your medical history.  A physical exam.  Imaging tests, such as an ultrasound or CT scan. These may  be needed if your health care provider suspects a hematoma in deeper tissues or body spaces.  Blood tests. These may be needed if your health care provider believes that the hematoma is caused by a medical condition. How is this treated? Treatment for this condition depends on the cause, size, and location of the hematoma. Treatment may include:  Doing nothing. The majority of hematomas do not need treatment as many of them go away on their own over time.  Surgery or close monitoring. This may be needed for large hematomas or hematomas that affect vital organs.  Medicines. Medicines may be given if there is an underlying medical cause for the hematoma. Follow these instructions at home: Managing pain, stiffness, and swelling  If directed, put ice on the affected area. ? Put ice in a plastic bag. ? Place a towel between your skin and the bag. ? Leave the ice on for 20 minutes, 2-3 times a day for the first couple of days.  If directed, apply heat to the affected area after applying ice for a couple of days. Use the heat source that your health care provider recommends, such as a moist heat pack or a heating pad. ? Place a towel between your skin and the heat source. ? Leave the heat on for 20-30 minutes. ? Remove the heat if your skin turns bright red. This is especially important if you are unable to feel pain, heat, or cold. You may have a greater   risk of getting burned.  Raise (elevate) the affected area above the level of your heart while you are sitting or lying down.  If told, wrap the affected area with an elastic bandage. The bandage applies pressure (compression) to the area, which may help to reduce swelling and promote healing. Do not wrap the bandage too tightly around the affected area.  If your hematoma is on a leg or foot (lower extremity) and is painful, your health care provider may recommend crutches. Use them as told by your health care provider.   General  instructions  Take over-the-counter and prescription medicines only as told by your health care provider.  Keep all follow-up visits as told by your health care provider. This is important. Contact a health care provider if:  You have a fever.  The swelling or discoloration gets worse.  You develop more hematomas. Get help right away if:  Your pain is worse or your pain is not controlled with medicine.  Your skin over the hematoma breaks or starts bleeding.  Your hematoma is in your chest or abdomen and you have weakness, shortness of breath, or a change in consciousness.  You have a hematoma on your scalp that is caused by a fall or injury, and you also have: ? A headache that gets worse. ? Trouble speaking or understanding speech. ? Weakness. ? Change in alertness or consciousness. Summary  A hematoma is a collection of blood under the skin, in an organ, in a body space, in a joint space, or in other tissue.  This condition usually does not need treatment because many hematomas go away on their own over time.  Large hematomas, or those that may affect vital organs, may need surgical drainage or monitoring. If the hematoma is caused by a medical condition, medicines may be prescribed.  Get help right away if your hematoma breaks or starts to bleed, you have shortness of breath, or you have a headache or trouble speaking after a fall. This information is not intended to replace advice given to you by your health care provider. Make sure you discuss any questions you have with your health care provider. Document Revised: 11/19/2018 Document Reviewed: 11/28/2017 Elsevier Patient Education  2021 [employer]  

## 2020-11-15 NOTE — Progress Notes (Signed)
Subjective:    Patient ID: Christine Rojas, female    DOB: 11-21-1965, 55 y.o.   MRN: [id number]  54y/o Caucasian established female pt presenting for f/u of L hand injury that occurred at work on 4/28. Spindle of dremel polishing machine slipped and hit her posterior hand causing a small abrasion and generalized hand swelling where equipment touched hand. She reports it was doing well until she noticed a knot over the injured area a couple of days ago. It is not painful and does not affect grip. Questioning if she needs imaging.  Wound healed never had purulent discharge applied neosporin until healed open wound.  Erythema resolved.  Noticed lump when rubbing back of her left hand.     Review of Systems  Constitutional: Negative for activity change, appetite change, chills, diaphoresis, fatigue and fever.  HENT: Negative for trouble swallowing and voice change.   Eyes: Negative for photophobia and visual disturbance.  Respiratory: Negative for cough, shortness of breath, wheezing and stridor.   Cardiovascular: Negative for palpitations.  Gastrointestinal: Negative for abdominal pain, diarrhea, nausea and vomiting.  Endocrine: Negative for polydipsia, polyphagia and polyuria.  Genitourinary: Negative for difficulty urinating.  Musculoskeletal: Negative for gait problem, joint swelling and myalgias.  Skin: Negative for color change, pallor, rash and wound.  Allergic/Immunologic: Positive for environmental allergies. Negative for food allergies.  Neurological: Negative for dizziness, tremors, seizures, syncope, facial asymmetry, speech difficulty, weakness, light-headedness, numbness and headaches.  Hematological: Negative for adenopathy. Does not bruise/bleed easily.  Psychiatric/Behavioral: Negative for agitation, confusion and sleep disturbance.       Objective:   Physical Exam Vitals and nursing note reviewed.  Constitutional:      General: She is awake. She is not in acute  distress.    Appearance: Normal appearance. She is well-developed and well-groomed. She is obese. She is not ill-appearing, toxic-appearing or diaphoretic.  HENT:     Head: Normocephalic and atraumatic.     Jaw: There is normal jaw occlusion.     Right Ear: Hearing and external ear normal.     Left Ear: Hearing and external ear normal.     Nose: Nose normal. No congestion or rhinorrhea.     Mouth/Throat:     Lips: Pink. No lesions.     Mouth: Mucous membranes are moist. No lacerations or angioedema.     Pharynx: Oropharynx is clear.  Eyes:     General: Lids are normal. Vision grossly intact. Gaze aligned appropriately. Allergic shiner present. No visual field deficit or scleral icterus.       Right eye: No discharge.        Left eye: No discharge.     Extraocular Movements: Extraocular movements intact.     Conjunctiva/sclera: Conjunctivae normal.     Pupils: Pupils are equal, round, and reactive to light.  Neck:     Trachea: Trachea and phonation normal. No tracheal deviation.  Cardiovascular:     Rate and Rhythm: Normal rate and regular rhythm.     Pulses: Normal pulses.          Radial pulses are 2+ on the right side and 2+ on the left side.  Pulmonary:     Effort: Pulmonary effort is normal.     Breath sounds: Normal breath sounds and air entry. No stridor or transmitted upper airway sounds. No wheezing or rhonchi.     Comments: Spoke full sentences without difficulty; no cough observed in exam room Abdominal:     General:  Abdomen is flat. There is no distension.     Palpations: Abdomen is soft.  Musculoskeletal:        General: Swelling, tenderness and signs of injury present. Normal range of motion.     Right shoulder: No swelling, deformity, effusion or laceration.     Left shoulder: No swelling, deformity, effusion or laceration.     Right elbow: No swelling, deformity, effusion or lacerations. Normal range of motion.     Left elbow: No swelling, deformity, effusion or  lacerations. Normal range of motion.     Right wrist: No swelling, deformity, effusion, lacerations, tenderness, bony tenderness, snuff box tenderness or crepitus.     Left wrist: No swelling, deformity, effusion, lacerations, tenderness, bony tenderness, snuff box tenderness or crepitus.     Right hand: No swelling, deformity, lacerations, tenderness or bony tenderness. Normal range of motion. Normal strength. Normal sensation. Normal capillary refill. Normal pulse.     Left hand: Swelling and tenderness present. No lacerations or bony tenderness. Normal range of motion. Normal strength. Normal sensation. Normal capillary refill. Normal pulse.       Arms:     Cervical back: Normal range of motion and neck supple. No edema, erythema, signs of trauma, rigidity or crepitus.     Right lower leg: No edema.     Left lower leg: No edema.  Lymphadenopathy:     Cervical: No cervical adenopathy.     Right cervical: No superficial cervical adenopathy.    Left cervical: No superficial cervical adenopathy.  Skin:    General: Skin is warm and dry.     Capillary Refill: Capillary refill takes less than 2 seconds.     Coloration: Skin is not ashen, cyanotic, jaundiced, mottled, pale or sallow.     Findings: No abrasion, abscess, acne, bruising, burn, ecchymosis, erythema, laceration, lesion, petechiae, rash or wound.       Neurological:     General: No focal deficit present.     Mental Status: She is alert and oriented to person, place, and time. Mental status is at baseline.     GCS: GCS eye subscore is 4. GCS verbal subscore is 5. GCS motor subscore is 6.     Cranial Nerves: Cranial nerves are intact. No cranial nerve deficit, dysarthria or facial asymmetry.     Sensory: Sensation is intact. No sensory deficit.     Motor: Motor function is intact. No weakness, tremor, atrophy, abnormal muscle tone or seizure activity.     Coordination: Coordination is intact. Coordination normal.     Gait: Gait is  intact. Gait normal.     Comments: Gait sure and steady in clinic; in/out of chair and on/off exam table without difficulty; bilateral hand grasp 5/5 equal  Psychiatric:        Attention and Perception: Attention and perception normal.        Mood and Affect: Mood and affect normal.        Speech: Speech normal.        Behavior: Behavior normal. Behavior is cooperative.        Thought Content: Thought content normal.        Cognition and Memory: Cognition and memory normal.        Judgment: Judgment normal.           Assessment & Plan:  A-hematoma left hand  P-trauma from dremel tool 2 weeks ago.  On initial presentation hand more swollen and open wound now healed.  Patient denied  decrease in AROM/function of hand just nodule noted slightly TTP.  Encouraged heat to bring more circulation to area to resolve hematoma.  May take tylenol 1000mg  po QID prn pain.  Discussed hematoma can sometimes take up to months to resolve.  No bony TTP.  If nodule enlarging consider imaging.   Discussed if she notices hand/finger weakness/ rash/increased temperature when not applying heat/new pain or locking/catching with finger extension/flexion to notify clinic staff.  Exitcare handout on hematoma given to patient printed.  Patient verbalized understanding information/instructions, agreed with plan of care and had no further questions at this time.

## 2020-12-15 ENCOUNTER — Encounter: Payer: Self-pay | Admitting: *Deleted

## 2020-12-15 ENCOUNTER — Telehealth: Payer: Self-pay | Admitting: *Deleted

## 2020-12-15 NOTE — Telephone Encounter (Signed)
Reviewed RN Haley note agreed with plan of care. 

## 2020-12-15 NOTE — Telephone Encounter (Signed)
Pt called RN reporting calling out of work today. Sts on Monday 6/6 she developed a sore throat and mild HA. She was at work Tuesday but left early. Off Wednesday and called out today/Thursday.  Sore throat and HA resolved Tuesday but productive cough and chest congestion began. She is taking Claritin and Alka Seltzer cold and sinus. She feels well on this regimen.  She did a home covid test 6/6 that was negative. She has asked her husband to bring another one home this evening for her to test again.   Day 0 6/6 Day 5 6/11 Testing Day 3 6/9 home test. Will f/u Friday for results.  RTW 6/12 or next scheduled workday of 6/13.

## 2020-12-17 NOTE — Telephone Encounter (Signed)
Patient returned call stated covid test results negative.  Still having some chest tightness/wheezing in evening and using her albuterol inhaler prn typically in the evening.  Taking her flonase, nasal saline and tylenol cold and sinus helping with post nasal drip.  Doesn't want any prednisone at this time.  If worsening with wheezing/cough to call me or see RN Rolly Salter in clinic on Monday 12/19/20  Prednisone 10mg  po taper with breakfast (30mg  x 3 days then 20mg  x 2 days then 10mg  x 2 days ) #21 RFO recommended.  "I get this every spring"  Epic chart review patient typically May-June does have sinusitis or bronchitis which sometimes requires antibiotics.  Has noticed nasty taste in mouth/back of throat again but feels that symptoms improving a little every day.  Spoke full sentences without difficulty; no cough/throat clearing or nasal sniffing noted during 4 minute telephone call. Cleared to return to work 6/12 or next scheduled workday.   Patient verbalized understanding information/instructions, agreed with plan of care and had no further questions at this time.  HR notified patient cleared to return onsite with mask wear through 12/22/20.

## 2020-12-18 NOTE — Telephone Encounter (Signed)
Patient contacted NP feeling better ready to return to work tomorrow.  Spoke full sentences without difficulty.  No cough/throat clearing or nasal congestion during 2 minute telephone call.  Cleared to return onsite 12/19/20 strict mask wear through day 10 12/22/20  Patient verbalized understanding information/instructions, agreed with plan of care and had no further questions at this time.

## 2020-12-21 NOTE — Telephone Encounter (Signed)
Patient contacted via telephone tomorrow day 10 last day of strict mask wear.  Still having some cough doesn't want to take prednisone, tessalon pearles don't help.  Using her albuterol inhaler daily but not before bed.  Discussed can help if having protracted coughing/tightness in chest to use her albuterol inhaler.  Spoke full sentences without difficulty, no cough/nasal sniffing or throat clearing noted during 3 minute telephone call.  Can use OTC cough drops, robitussin/dayquil as cough usually nonproductive per patient.  Having her blood sugars checked and doesn't want prednisone to raise up her numbers.  Patient to follow up for re-evaluation if symptoms worsening.  Working without difficulty or concern.  Patient no further questions at this time, agreed with plan of care and verbalized understanding information/instructions.

## 2020-12-29 NOTE — Telephone Encounter (Signed)
Patient contacted via telephone to follow up cough.  She reported it has resolved no further questions or concerns at this time.  A&OX3 spoke full sentences without difficulty.  No cough/congestion/throat clearing noted during 2 minute telephone call.

## 2021-01-05 ENCOUNTER — Other Ambulatory Visit: Payer: Self-pay

## 2021-01-05 ENCOUNTER — Ambulatory Visit: Payer: Self-pay | Admitting: *Deleted

## 2021-01-05 VITALS — BP 122/80

## 2021-01-05 DIAGNOSIS — I10 Essential (primary) hypertension: Secondary | ICD-10-CM

## 2021-01-05 NOTE — Progress Notes (Signed)
BP check per pt request 

## 2021-01-16 ENCOUNTER — Ambulatory Visit: Payer: Self-pay | Admitting: *Deleted

## 2021-01-16 ENCOUNTER — Other Ambulatory Visit: Payer: Self-pay

## 2021-01-16 DIAGNOSIS — R7401 Elevation of levels of liver transaminase levels: Secondary | ICD-10-CM

## 2021-01-16 DIAGNOSIS — E119 Type 2 diabetes mellitus without complications: Secondary | ICD-10-CM

## 2021-01-16 NOTE — Progress Notes (Signed)
Nonfasting labs per pcp orders. Orders in paper chart.

## 2021-01-17 ENCOUNTER — Encounter: Payer: Self-pay | Admitting: Registered Nurse

## 2021-01-18 ENCOUNTER — Encounter: Payer: Self-pay | Admitting: Registered Nurse

## 2021-01-18 LAB — COMPREHENSIVE METABOLIC PANEL
ALT: 38 IU/L — ABNORMAL HIGH (ref 0–32)
AST: 23 IU/L (ref 0–40)
Albumin/Globulin Ratio: 1.9 (ref 1.2–2.2)
Albumin: 4.7 g/dL (ref 3.8–4.9)
Alkaline Phosphatase: 107 IU/L (ref 44–121)
BUN/Creatinine Ratio: 21 (ref 9–23)
BUN: 19 mg/dL (ref 6–24)
Bilirubin Total: 0.3 mg/dL (ref 0.0–1.2)
CO2: 20 mmol/L (ref 20–29)
Calcium: 9.7 mg/dL (ref 8.7–10.2)
Chloride: 98 mmol/L (ref 96–106)
Creatinine, Ser: 0.92 mg/dL (ref 0.57–1.00)
Globulin, Total: 2.5 g/dL (ref 1.5–4.5)
Glucose: 102 mg/dL — ABNORMAL HIGH (ref 65–99)
Potassium: 4.5 mmol/L (ref 3.5–5.2)
Sodium: 137 mmol/L (ref 134–144)
Total Protein: 7.2 g/dL (ref 6.0–8.5)
eGFR: 74 mL/min/{1.73_m2} (ref >59)

## 2021-01-18 LAB — HGB A1C W/O EAG: Hgb A1c MFr Bld: 6.8 % — ABNORMAL HIGH (ref 4.8–5.6)

## 2021-01-18 NOTE — Addendum Note (Signed)
Addended by: Albina Billet A on: 01/18/2021 11:16 AM   Modules accepted: Orders

## 2021-01-24 ENCOUNTER — Other Ambulatory Visit: Payer: Self-pay

## 2021-01-24 ENCOUNTER — Encounter: Payer: Self-pay | Admitting: Registered Nurse

## 2021-01-24 ENCOUNTER — Ambulatory Visit: Payer: Self-pay | Admitting: Registered Nurse

## 2021-01-24 VITALS — BP 119/97 | HR 82 | Temp 97.4°F

## 2021-01-24 DIAGNOSIS — H6983 Other specified disorders of Eustachian tube, bilateral: Secondary | ICD-10-CM

## 2021-01-24 DIAGNOSIS — J301 Allergic rhinitis due to pollen: Secondary | ICD-10-CM

## 2021-01-24 DIAGNOSIS — J00 Acute nasopharyngitis [common cold]: Secondary | ICD-10-CM

## 2021-01-24 DIAGNOSIS — R059 Cough, unspecified: Secondary | ICD-10-CM

## 2021-01-24 MED ORDER — PHENYLEPHRINE HCL 5 MG PO TABS: 5.0000 mg | ORAL_TABLET | Freq: Four times a day (QID) | ORAL | Status: AC | PRN

## 2021-01-24 NOTE — Telephone Encounter (Signed)
Results for Christine Rojas, Christine Rojas" (MRN [id number]) as of 01/24/2021 11:47  Ref. Range 01/16/2021 09:45  Sodium Latest Ref Range: 134 - 144 mmol/L 137  Potassium Latest Ref Range: 3.5 - 5.2 mmol/L 4.5  Chloride Latest Ref Range: 96 - 106 mmol/L 98  CO2 Latest Ref Range: 20 - 29 mmol/L 20  Glucose Latest Ref Range: 65 - 99 mg/dL 102 (H)  BUN Latest Ref Range: 6 - 24 mg/dL 19  Creatinine Latest Ref Range: 0.57 - 1.00 mg/dL 0.92  Calcium Latest Ref Range: 8.7 - 10.2 mg/dL 9.7  BUN/Creatinine Ratio Latest Ref Range: 9 - 23  21  eGFR Latest Ref Range: >59 mL/min/1.73 74  Alkaline Phosphatase Latest Ref Range: 44 - 121 IU/L 107  Albumin Latest Ref Range: 3.8 - 4.9 g/dL 4.7  Albumin/Globulin Ratio Latest Ref Range: 1.2 - 2.2  1.9  AST Latest Ref Range: 0 - 40 IU/L 23  ALT Latest Ref Range: 0 - 32 IU/L 38 (H)  Total Protein Latest Ref Range: 6.0 - 8.5 g/dL 7.2  Total Bilirubin Latest Ref Range: 0.0 - 1.2 mg/dL 0.3  Globulin, Total Latest Ref Range: 1.5 - 4.5 g/dL 2.5  Hemoglobin A1C Latest Ref Range: 4.8 - 5.6 % 6.8 (H)  Will repeat LFTs in 3 months with next Hgba1c improving.  Patient verbalized understanding information/results and had no further questions at this time.  Seen in clinic today.  See previous results note

## 2021-01-24 NOTE — Patient Instructions (Signed)
Eustachian Tube Dysfunction  Eustachian tube dysfunction refers to a condition in which a blockage develops in the narrow passage that connects the middle ear to the back of the nose (eustachian tube). The eustachian tube regulates air pressure in the middle ear by letting air move between the ear and nose. It also helps to drain fluid from the middle earspace. Eustachian tube dysfunction can affect one or both ears. When the eustachian tube does not function properly, air pressure, fluid, or both can build up inthe middle ear. What are the causes? This condition occurs when the eustachian tube becomes blocked or cannot open normally. Common causes of this condition include: Ear infections. Colds and other infections that affect the nose, mouth, and throat (upper respiratory tract). Allergies. Irritation from cigarette smoke. Irritation from stomach acid coming up into the esophagus (gastroesophageal reflux). The esophagus is the tube that carries food from the mouth to the stomach. Sudden changes in air pressure, such as from descending in an airplane or scuba diving. Abnormal growths in the nose or throat, such as: Growths that line the nose (nasal polyps). Abnormal growth of cells (tumors). Enlarged tissue at the back of the throat (adenoids). What increases the risk? You are more likely to develop this condition if: You smoke. You are overweight. You are a child who has: Certain birth defects of the mouth, such as cleft palate. Large tonsils or adenoids. What are the signs or symptoms? Common symptoms of this condition include: A feeling of fullness in the ear. Ear pain. Clicking or popping noises in the ear. Ringing in the ear. Hearing loss. Loss of balance. Dizziness. Symptoms may get worse when the air pressure around you changes, such as whenyou travel to an area of high elevation, fly on an airplane, or go scuba diving. How is this diagnosed? This condition may be diagnosed  based on: Your symptoms. A physical exam of your ears, nose, and throat. Tests, such as those that measure: The movement of your eardrum (tympanogram). Your hearing (audiometry). How is this treated? Treatment depends on the cause and severity of your condition. In mild cases, you may relieve your symptoms by moving air into your ears. This is called "popping the ears." In more severe cases, or if you have symptoms of fluid in your ears, treatment may include: Medicines to relieve congestion (decongestants). Medicines that treat allergies (antihistamines). Nasal sprays or ear drops that contain medicines that reduce swelling (steroids). A procedure to drain the fluid in your eardrum (myringotomy). In this procedure, a small tube is placed in the eardrum to: Drain the fluid. Restore the air in the middle ear space. A procedure to insert a balloon device through the nose to inflate the opening of the eustachian tube (balloon dilation). Follow these instructions at home: Lifestyle Do not do any of the following until your health care provider approves: Travel to high altitudes. Fly in airplanes. Work in a pressurized cabin or room. Scuba dive. Do not use any products that contain nicotine or tobacco, such as cigarettes and e-cigarettes. If you need help quitting, ask your health care provider. Keep your ears dry. Wear fitted earplugs during showering and bathing. Dry your ears completely after. General instructions Take over-the-counter and prescription medicines only as told by your health care provider. Use techniques to help pop your ears as recommended by your health care provider. These may include: Chewing gum. Yawning. Frequent, forceful swallowing. Closing your mouth, holding your nose closed, and gently blowing as if you   are trying to blow air out of your nose. Keep all follow-up visits as told by your health care provider. This is important. Contact a health care provider  if: Your symptoms do not go away after treatment. Your symptoms come back after treatment. You are unable to pop your ears. You have: A fever. Pain in your ear. Pain in your head or neck. Fluid draining from your ear. Your hearing suddenly changes. You become very dizzy. You lose your balance. Summary Eustachian tube dysfunction refers to a condition in which a blockage develops in the eustachian tube. It can be caused by ear infections, allergies, inhaled irritants, or abnormal growths in the nose or throat. Symptoms include ear pain, hearing loss, or ringing in the ears. Mild cases are treated with maneuvers to unblock the ears, such as yawning or ear popping. Severe cases are treated with medicines. Surgery may also be done (rare). This information is not intended to replace advice given to you by your health care provider. Make sure you discuss any questions you have with your healthcare provider. Document Revised: 10/15/2017 Document Reviewed: 10/15/2017 Elsevier Patient Education  2022 [employer] How to Perform a Sinus Rinse A sinus rinse is a home treatment that is used to rinse your sinuses with a sterile mixture of salt and water (saline solution). Sinuses are air-filled spaces in your skull behind the bones of your faceand forehead that open into your nasal cavity. A sinus rinse can help to clear mucus, dirt, dust, or pollen from your nasal cavity. You may do a sinus rinse when you have a cold, a virus, nasal allergysymptoms, a sinus infection, or stuffiness in your nose or sinuses. Talk with your health care provider about whether a sinus rinse might help you. What are the risks? A sinus rinse is generally safe and effective. However, there are a few risks, which include: A burning sensation in your sinuses. This may happen if you do not make the saline solution as directed. Be sure to follow all directions when making the saline solution. Nasal irritation. Infection from  contaminated water. This is rare, but possible. Do not do a sinus rinse if you have had ear or nasal surgery, ear infection, orblocked ears. Supplies needed: Saline solution or powder. Distilled or sterile water to mix with saline powder. You may use boiled and cooled tap water. Boil tap water for 5 minutes; cool until it is lukewarm. Use within 24 hours. Do not use regular tap water to mix with the saline solution. Neti pot or nasal rinse bottle. These supplies release the saline solution into your nose and through your sinuses. Neti pots and nasal rinse bottles can be purchased at [employer], a health food store, or online. How to perform a sinus rinse  Wash your hands with soap and water. Wash your device according to the directions that came with the product and then dry it. Use the solution that comes with your product or one that is sold separately in stores. Follow the mixing directions on the package to mix with sterile or distilled water. Fill the device with the amount of saline solution noted in the device instructions. Stand over a sink and tilt your head sideways over the sink. Place the spout of the device in your upper nostril (the one closer to the ceiling). Gently pour or squeeze the saline solution into your nasal cavity. The liquid should drain out from the lower nostril if you are not too congested. While rinsing,  breathe through your open mouth. Gently blow your nose to clear any mucus and rinse solution. Blowing too hard may cause ear pain. Repeat in your other nostril. Clean and rinse your device with clean water and then air-dry it. Talk with your health care provider or pharmacist if you have questions abouthow to do a sinus rinse. Summary A sinus rinse is a home treatment that is used to rinse your sinuses with a sterile mixture of salt and water (saline solution). A sinus rinse is generally safe and effective. Follow all instructions carefully. Before  doing a sinus rinse, talk with your health care provider about whether it would be helpful for you. This information is not intended to replace advice given to you by your health care provider. Make sure you discuss any questions you have with your healthcare provider. Document Revised: 04/05/2020 Document Reviewed: 04/05/2020 Else[employer] Patient Education  2022 [employer] Sinusitis, Adult Sinusitis is inflammation of your sinuses. Sinuses are hollow spaces in the bones around your face. Your sinuses are located: Around your eyes. In the middle of your forehead. Behind your nose. In your cheekbones. Mucus normally drains out of your sinuses. When your nasal tissues become inflamed or swollen, mucus can become trapped or blocked. This allows bacteria, viruses, and fungi to grow, which leads to infection. Most infections of thesinuses are caused by a virus. Sinusitis can develop quickly. It can last for up to 4 weeks (acute) or for more than 12 weeks (chronic). Sinusitis often develops after a cold. What are the causes? This condition is caused by anything that creates swelling in the sinuses or stops mucus from draining. This includes: Allergies. Asthma. Infection from bacteria or viruses. Deformities or blockages in your nose or sinuses. Abnormal growths in the nose (nasal polyps). Pollutants, such as chemicals or irritants in the air. Infection from fungi (rare). What increases the risk? You are more likely to develop this condition if you: Have a weak body defense system (immune system). Do a lot of swimming or diving. Overuse nasal sprays. Smoke. What are the signs or symptoms? The main symptoms of this condition are pain and a feeling of pressure around the affected sinuses. Other symptoms include: Stuffy nose or congestion. Thick drainage from your nose. Swelling and warmth over the affected sinuses. Headache. Upper toothache. A cough that may get worse at night. Extra mucus  that collects in the throat or the back of the nose (postnasal drip). Decreased sense of smell and taste. Fatigue. A fever. Sore throat. Bad breath. How is this diagnosed? This condition is diagnosed based on: Your symptoms. Your medical history. A physical exam. Tests to find out if your condition is acute or chronic. This may include: Checking your nose for nasal polyps. Viewing your sinuses using a device that has a light (endoscope). Testing for allergies or bacteria. Imaging tests, such as an MRI or CT scan. In rare cases, a bone biopsy may be done to rule out more serious types offungal sinus disease. How is this treated? Treatment for sinusitis depends on the cause and whether your condition is chronic or acute. If caused by a virus, your symptoms should go away on their own within 10 days. You may be given medicines to relieve symptoms. They include: Medicines that shrink swollen nasal passages (topical intranasal decongestants). Medicines that treat allergies (antihistamines). A spray that eases inflammation of the nostrils (topical intranasal corticosteroids). Rinses that help get rid of thick mucus in your nose (nasal saline washes). If  caused by bacteria, your health care provider may recommend waiting to see if your symptoms improve. Most bacterial infections will get better without antibiotic medicine. You may be given antibiotics if you have: A severe infection. A weak immune system. If caused by narrow nasal passages or nasal polyps, you may need to have surgery. Follow these instructions at home: Medicines Take, use, or apply over-the-counter and prescription medicines only as told by your health care provider. These may include nasal sprays. If you were prescribed an antibiotic medicine, take it as told by your health care provider. Do not stop taking the antibiotic even if you start to feel better. Hydrate and humidify  Drink enough fluid to keep your urine pale  yellow. Staying hydrated will help to thin your mucus. Use a cool mist humidifier to keep the humidity level in your home above 50%. Inhale steam for 10-15 minutes, 3-4 times a day, or as told by your health care provider. You can do this in the bathroom while a hot shower is running. Limit your exposure to cool or dry air.  Rest Rest as much as possible. Sleep with your head raised (elevated). Make sure you get enough sleep each night. General instructions  Apply a warm, moist washcloth to your face 3-4 times a day or as told by your health care provider. This will help with discomfort. Wash your hands often with soap and water to reduce your exposure to germs. If soap and water are not available, use hand sanitizer. Do not smoke. Avoid being around people who are smoking (secondhand smoke). Keep all follow-up visits as told by your health care provider. This is important.  Contact a health care provider if: You have a fever. Your symptoms get worse. Your symptoms do not improve within 10 days. Get help right away if: You have a severe headache. You have persistent vomiting. You have severe pain or swelling around your face or eyes. You have vision problems. You develop confusion. Your neck is stiff. You have trouble breathing. Summary Sinusitis is soreness and inflammation of your sinuses. Sinuses are hollow spaces in the bones around your face. This condition is caused by nasal tissues that become inflamed or swollen. The swelling traps or blocks the flow of mucus. This allows bacteria, viruses, and fungi to grow, which leads to infection. If you were prescribed an antibiotic medicine, take it as told by your health care provider. Do not stop taking the antibiotic even if you start to feel better. Keep all follow-up visits as told by your health care provider. This is important. This information is not intended to replace advice given to you by your health care provider. Make sure  you discuss any questions you have with your healthcare provider. Document Revised: 11/25/2017 Document Reviewed: 11/25/2017 Elsevier Patient Education  2022 [employer] Nonallergic Rhinitis Nonallergic rhinitis is inflammation of the mucous membrane inside the nose. The mucous membrane is the tissue that produces mucus. This condition is different from having allergic rhinitis, which is an allergy that affects the nose. Allergic rhinitis occurs when the body's defense system, or immune system, reacts to a substance that a person is allergic to (allergen), such as pollen, pet dander, mold, or dust. Nonallergic rhinitis has manysimilar symptoms, but it is not caused by allergens. Nonallergic rhinitis can be an acute or chronic problem. This means it can beshort-term or long-term. What are the causes? This condition may be caused by many different things. Some common types of nonallergic rhinitis  include: Infectious rhinitis. This is usually caused by an infection in the nose, throat, or upper airways (upper respiratory system). Vasomotor rhinitis. This is the most common type of chronic nonallergic rhinitis. It is caused by too much blood flow through your nose, and it leads to swelling in your nose. It is triggered by strong odors, cold air, stress, drinking alcohol, cigarette smoke, or changes in the weather. Occupational rhinitis. This type is caused by triggers in the workplace, such as chemicals, dust, animal dander, or air pollution. Hormonal rhinitis, in teenage girls and women. This type is caused by an increase in the hormone estrogen and may happen during pregnancy, puberty, or monthly menstrual periods. Hormonal rhinitis gives you fewer symptoms when estrogen levels drop. Drug-induced rhinitis. Several types of medicines can cause this, such as medicines for high blood pressure or heart disease, aspirin, or NSAIDs. Nonallergic rhinitis with eosinophilia syndrome (NARES). This type is caused  by having too much eosinophil, a type of white blood cell. Other causes include a reaction to eating hot or spicy foods. This does notusually cause long-term symptoms. In some cases, the cause of nonallergic rhinitis is not known. What increases the risk? You are more likely to develop this condition if: You are 4530-56 years of age. You are a woman. Women are twice as likely to have this condition. What are the signs or symptoms? Common symptoms of this condition include: Stuffy nose (nasal congestion). Runny nose. A feeling of mucus dripping down the back of your throat (postnasal drip). Trouble sleeping. Tiredness, or fatigue. Other symptoms include: Sneezing. Coughing. Itchy nose. Bloodshot eyes. How is this diagnosed? This type may be diagnosed based on: Your symptoms and medical history. A physical exam. Allergy testing to rule out allergic rhinitis. You may have skin tests or blood tests. Your health care provider may also take a swab of nasal discharge to look for an increased number of eosinophils. This would be done to confirm a diagnosisof NARES. How is this treated? Treatment for this condition depends on the cause. No single treatment works for everyone. Work with your health care provider to find the best treatment for you. Treatment may include: Avoiding the things that trigger your symptoms. Medicines to relieve congestion, such as: Steroid nasal spray. There are many types. You may need to try a few to find out which one works best. Engineer, civil (consulting)Decongestant medicine. This treats nasal congestion and may be given by mouth or as a nasal spray. These medicines are used only for a short time. Medicines to relieve a runny nose. These may include antihistamine medicines or anticholinergic nasal sprays. Nasal irrigation. This involves using a salt-water (saline) spray or saline container called a neti pot. Nasal irrigation helps to clear away mucus and keep your nasal passages  moist. Surgery to remove part of your mucous membrane. This is done in severe cases if the condition has not improved after 6-12 months of treatment. Follow these instructions at home: Medicines Take or use over-the-counter and prescription medicines only as told by your health care provider. Do not stop using your medicine even if you start to feel better. Do not take NSAIDs, such as ibuprofen, or medicines that contain aspirin if they make your symptoms worse. Lifestyle Do not drink alcohol if it makes your symptoms worse. Do not use any products that contain nicotine or tobacco, such as cigarettes, e-cigarettes, and chewing tobacco. If you need help quitting, ask your health care provider. Avoid secondhand smoke. General instructions Avoid triggers  that make your symptoms worse. Use nasal irrigation as told by your health care provider. Get exercise. Exercise may help reduce symptoms for some people. Sleep with the head of your bed raised. This may reduce nasal congestion when you sleep. Drink enough fluid to keep your urine pale yellow. Keep all follow-up visits as told by your health care provider. This is important. Contact a health care provider if: You have a fever. Your symptoms are getting worse at home. Your symptoms do not lessen with medicine. You develop new symptoms, especially a headache or nosebleed. Summary Nonallergic rhinitis is inflammation inside the nose that is not caused by allergens. Nonallergic rhinitis can be a short-term or long-term problem. Treatment may include avoiding the things that trigger your symptoms. Take or use over-the-counter and prescription medicines only as told by your health care provider. Do not stop using your medicine even if you start to feel better. Contact a health care provider if your symptoms do not lessen with medicine. This information is not intended to replace advice given to you by your health care provider. Make sure you  discuss any questions you have with your healthcare provider. Document Revised: 05/04/2019 Document Reviewed: 05/04/2019 Elsevier Patient Education  2022 [employer]. Allergic Rhinitis, Adult  Allergic rhinitis is an allergic reaction that affects the mucous membraneinside the nose. The mucous membrane is the tissue that produces mucus. There are two types of allergic rhinitis: Seasonal. This type is also called hay fever and happens only during certain seasons. Perennial. This type can happen at any time of the year. Allergic rhinitis cannot be spread from person to person. This condition can bemild, moderate, or severe. It can develop at any age and may be outgrown. What are the causes? This condition is caused by allergens. These are things that can cause an allergic reaction. Allergens may differ for seasonal allergic rhinitis and perennial allergic rhinitis. Seasonal allergic rhinitis is triggered by pollen. Pollen can come from grasses, trees, and weeds. Perennial allergic rhinitis may be triggered by: Dust mites. Proteins in a pet's urine, saliva, or dander. Dander is dead skin cells from a pet. Smoke, mold, or car fumes. What increases the risk? You are more likely to develop this condition if you have a family history of allergies or other conditions related to allergies, including: Allergic conjunctivitis. This is inflammation of parts of the eyes and eyelids. Asthma. This condition affects the lungs and makes it hard to breathe. Atopic dermatitis or eczema. This is long term (chronic) inflammation of the skin. Food allergies. What are the signs or symptoms? Symptoms of this condition include: Sneezing or coughing. A stuffy nose (nasal congestion), itchy nose, or nasal discharge. Itchy eyes and tearing of the eyes. A feeling of mucus dripping down the back of your throat (postnasal drip). Trouble sleeping. Tiredness or fatigue. Headache. Sore throat. How is this  diagnosed? This condition may be diagnosed with your symptoms, medical history, and physical exam. Your health care provider may check for related conditions, such as: Asthma. Pink eye. This is eye inflammation caused by infection (conjunctivitis). Ear infection. Upper respiratory infection. This is an infection in the nose, throat, or upper airways. You may also have tests to find out which allergens trigger your symptoms.These may include skin tests or blood tests. How is this treated? There is no cure for this condition, but treatment can help control symptoms. Treatment may include: Taking medicines that block allergy symptoms, such as corticosteroids and antihistamines. Medicine may be  given as a shot, nasal spray, or pill. Avoiding any allergens. Being exposed again and again to tiny amounts of allergens to help you build a defense against allergens (immunotherapy). This is done if other treatments have not helped. It may include: Allergy shots. These are injected medicines that have small amounts of allergen in them. Sublingual immunotherapy. This involves taking small doses of a medicine with allergen in it under your tongue. If these treatments do not work, your health care provider may prescribe newer,stronger medicines. Follow these instructions at home: Avoiding allergens Find out what you are allergic to and avoid those allergens. These are some things you can do to help avoid allergens: If you have perennial allergies: Replace carpet with wood, tile, or vinyl flooring. Carpet can trap dander and dust. Do not smoke. Do not allow smoking in your home. Change your heating and air conditioning filters at least once a month. If you have seasonal allergies, take these steps during allergy season: Keep windows closed as much as possible. Plan outdoor activities when pollen counts are lowest. Check pollen counts before you plan outdoor activities. When coming indoors, change clothing  and shower before sitting on furniture or bedding. If you have a pet in the house that produces allergens: Keep the pet out of the bedroom. Vacuum, sweep, and dust regularly. General instructions Take over-the-counter and prescription medicines only as told by your health care provider. Drink enough fluid to keep your urine pale yellow. Keep all follow-up visits as told by your health care provider. This is important. Where to find more information [employer]: www.aaaai.org Contact a health care provider if: You have a fever. You develop a cough that does not go away. You make whistling sounds when you breathe (wheeze). Your symptoms slow you down or stop you from doing your normal activities each day. Get help right away if: You have shortness of breath. This symptom may represent a serious problem that is an emergency. Do not wait to see if the symptom will go away. Get medical help right away. Call your local emergency services (911 in the [place] Do not drive yourself to the hospital. Summary Allergic rhinitis may be managed by taking medicines as directed and avoiding allergens. If you have seasonal allergies, keep windows closed as much as possible during allergy season. Contact your health care provider if you develop a fever or a cough that does not go away. This information is not intended to replace advice given to you by your health care provider. Make sure you discuss any questions you have with your healthcare provider. Document Revised: 08/14/2019 Document Reviewed: 06/23/2019 Elsevier Patient Education  2022 [employer].

## 2021-01-24 NOTE — Progress Notes (Signed)
Subjective:    Patient ID: Christine Rojas, female    DOB: [birth year], 55 y.o.   MRN: [id number]  55y/o Caucasian established married female pt c/o URI symptoms cough and chest congestion x3 weeks. Reports cough is productive and mucus has a bad taste. Has tested for Covid at home, last time one week ago and that was negative. Reports symptoms persist despite OTC meds. Now last week ears has started bothering her and rhinorrhea present as well.  Wearing KN95 mask at work.  Denied ear discharge, fever, chills, n/v/d, rash, teeth pain.  Feels like water in ears at times and balance off at times.  Hasn't taken allegra for a couple days ran out.  Not using any dayquil/phenylephrine.  Is using nasal saline in the shower and flonase.  Questions on administration saline and flonase today regarding volume of saline required to be effective.     Review of Systems  Constitutional:  Negative for activity change, appetite change, chills, diaphoresis, fatigue, fever and unexpected weight change.  HENT:  Positive for congestion, ear pain, hearing loss, postnasal drip, rhinorrhea, sinus pressure and sore throat. Negative for dental problem, drooling, ear discharge, facial swelling, mouth sores, nosebleeds, sinus pain, sneezing, tinnitus, trouble swallowing and voice change.   Eyes:  Negative for photophobia, pain, discharge, redness, itching and visual disturbance.  Respiratory:  Positive for cough. Negative for choking, chest tightness, shortness of breath, wheezing and stridor.   Cardiovascular:  Negative for chest pain, palpitations and leg swelling.  Gastrointestinal:  Negative for abdominal pain, diarrhea, nausea and vomiting.  Endocrine: Negative for cold intolerance and heat intolerance.  Genitourinary:  Negative for difficulty urinating.  Musculoskeletal:  Negative for arthralgias, back pain, gait problem, joint swelling, myalgias, neck pain and neck stiffness.  Skin:  Negative for color change, pallor,  rash and wound.  Allergic/Immunologic: Positive for environmental allergies. Negative for food allergies.  Neurological:  Positive for dizziness. Negative for tremors, seizures, syncope, facial asymmetry, speech difficulty, weakness, light-headedness, numbness and headaches.  Hematological:  Negative for adenopathy. Does not bruise/bleed easily.  Psychiatric/Behavioral:  Negative for agitation, behavioral problems, confusion and sleep disturbance.       Objective:   Physical Exam Vitals and nursing note reviewed.  Constitutional:      General: She is awake. She is not in acute distress.    Appearance: Normal appearance. She is well-developed and well-groomed. She is obese. She is not ill-appearing, toxic-appearing or diaphoretic.  HENT:     Head: Normocephalic and atraumatic.     Jaw: There is normal jaw occlusion. No trismus, tenderness, swelling or pain on movement.     Salivary Glands: Right salivary gland is not diffusely enlarged or tender. Left salivary gland is not diffusely enlarged or tender.     Right Ear: Hearing, ear canal and external ear normal. A middle ear effusion is present.     Left Ear: Hearing, ear canal and external ear normal. A middle ear effusion is present.     Nose: Mucosal edema, congestion and rhinorrhea present. No nasal deformity, septal deviation or laceration. Rhinorrhea is clear.     Right Turbinates: Enlarged and swollen. Not pale.     Left Turbinates: Enlarged and swollen. Not pale.     Right Sinus: No maxillary sinus tenderness or frontal sinus tenderness.     Left Sinus: No maxillary sinus tenderness or frontal sinus tenderness.     Mouth/Throat:     Lips: Pink. No lesions.     Mouth:  Mucous membranes are moist. Mucous membranes are not pale, not dry and not cyanotic. No injury, lacerations, oral lesions or angioedema.     Dentition: No dental caries, dental abscesses or gum lesions.     Pharynx: Uvula midline. Pharyngeal swelling and posterior  oropharyngeal erythema present. No oropharyngeal exudate or uvula swelling.     Tonsils: No tonsillar exudate or tonsillar abscesses.  Eyes:     General: Lids are normal. Vision grossly intact. Gaze aligned appropriately. Allergic shiner present. No visual field deficit or scleral icterus.       Right eye: No foreign body, discharge or hordeolum.        Left eye: No foreign body, discharge or hordeolum.     Extraocular Movements: Extraocular movements intact.     Right eye: Normal extraocular motion and no nystagmus.     Left eye: Normal extraocular motion and no nystagmus.     Conjunctiva/sclera: Conjunctivae normal.     Right eye: Right conjunctiva is not injected. No chemosis, exudate or hemorrhage.    Left eye: Left conjunctiva is not injected. No chemosis, exudate or hemorrhage.    Pupils: Pupils are equal, round, and reactive to light. Pupils are equal.     Right eye: Pupil is round and reactive.     Left eye: Pupil is round and reactive.  Neck:     Thyroid: No thyroid mass or thyromegaly.     Trachea: Trachea and phonation normal. No tracheal tenderness or tracheal deviation.  Cardiovascular:     Rate and Rhythm: Normal rate and regular rhythm.     Pulses: Normal pulses.          Radial pulses are 2+ on the right side and 2+ on the left side.     Heart sounds: Normal heart sounds, S1 normal and S2 normal. Heart sounds not distant. No murmur heard.   No friction rub. No gallop.  Pulmonary:     Effort: Pulmonary effort is normal. No accessory muscle usage or respiratory distress.     Breath sounds: Normal breath sounds. No stridor, decreased air movement or transmitted upper airway sounds. No decreased breath sounds, wheezing, rhonchi or rales.     Comments: Spoke full sentences without difficulty; no cough observed in clinic; wearing KN95 mask in clinic Chest:     Chest wall: No tenderness.  Abdominal:     General: There is no distension.     Palpations: Abdomen is soft.   Musculoskeletal:        General: No tenderness. Normal range of motion.     Right shoulder: Normal. No swelling, deformity, effusion or laceration. Normal range of motion.     Left shoulder: Normal. No swelling, deformity, effusion or laceration. Normal range of motion.     Right elbow: No swelling, deformity, effusion or lacerations. Normal range of motion.     Left elbow: No swelling, deformity, effusion or lacerations. Normal range of motion.     Right hand: Normal. No swelling, deformity or lacerations. Normal range of motion. Normal strength.     Left hand: Normal. No swelling, deformity or lacerations. Normal range of motion. Normal strength.     Cervical back: Normal range of motion and neck supple. No swelling, edema, deformity, erythema, signs of trauma, lacerations, rigidity, torticollis, tenderness or crepitus. No pain with movement. Normal range of motion.     Thoracic back: No swelling, edema, deformity, signs of trauma or lacerations.     Lumbar back: No swelling, edema, deformity,  signs of trauma or lacerations.     Right hip: Normal.     Left hip: Normal.     Right knee: Normal.     Left knee: Normal.  Lymphadenopathy:     Head:     Right side of head: No submental, submandibular, tonsillar, preauricular, posterior auricular or occipital adenopathy.     Left side of head: No submental, submandibular, tonsillar, preauricular, posterior auricular or occipital adenopathy.     Cervical: No cervical adenopathy.     Right cervical: No superficial, deep or posterior cervical adenopathy.    Left cervical: No superficial, deep or posterior cervical adenopathy.  Skin:    General: Skin is warm and dry.     Capillary Refill: Capillary refill takes less than 2 seconds.     Coloration: Skin is not ashen, cyanotic, jaundiced, mottled, pale or sallow.     Findings: No abrasion, abscess, acne, bruising, burn, ecchymosis, erythema, signs of injury, laceration, lesion, petechiae, rash or  wound.     Nails: There is no clubbing.  Neurological:     Mental Status: She is alert and oriented to person, place, and time. Mental status is at baseline. She is not disoriented.     GCS: GCS eye subscore is 4. GCS verbal subscore is 5. GCS motor subscore is 6.     Cranial Nerves: Cranial nerves are intact. No cranial nerve deficit, dysarthria or facial asymmetry.     Sensory: Sensation is intact. No sensory deficit.     Motor: Motor function is intact. No weakness, tremor, atrophy, abnormal muscle tone or seizure activity.     Coordination: Coordination is intact. Coordination normal.     Gait: Gait is intact. Gait normal.     Comments: Gait sure and steady in clinic; in/out of chair without difficulty; bilateral hand grasp equal 5/5  Psychiatric:        Attention and Perception: Attention and perception normal.        Mood and Affect: Mood and affect normal.        Speech: Speech normal.        Behavior: Behavior normal. Behavior is cooperative.        Thought Content: Thought content normal.        Cognition and Memory: Cognition and memory normal.        Judgment: Judgment normal.     Cobblestoning posterior pharynx; bilateral air fluid level clear TMs, bilateral allergic shiners; nasal turbinates edema/erythema clear mucous  Reviewed BMET/LFT/Hgb1c results with patient in clinic today.  See results note.  Patient to repeat LFTs and Hgba1c nonfasting in 3 months/schedule with RN Rolly Salter. LFTs and Hgba1c improved from previous.   Patient verbalized understanding information/instructions, agreed with plan of care and had no further questions at this time.     Assessment & Plan:  A-acute rhinitis, seasonal allergic rhinitis, cough, bilateral eustachian tube dysfunction   P-Ran out of allegra restart after buying today or tomorrow.  Given claritin 10mg  from clinic stock for use today/tomorrow, phenylephrine 10mg  po QID prn rhinitis given 4 UD from clinic stock for prn use; nasal  saline and flonase administration discussed e.g. nasal saline first sniff until she feels water running down throat may swallow or spit out.  Wait 10-15 minutes then blow nose and may repeat if still congestion/thick mucous.  Flonase light inhale to keep medicine in nose/sinuses not running down throat.  With covid tests negative other viruses are circulating in the community.  Continue mask wear  at work/when around others to prevent spread.  OTC cough medicine e.g. robitussin/delsym/nyquil/cough drops.  Discussed post nasal drip most likely driver of her cough and need to dry up drip to get cough to clear up.  If worsening cough notify clinic staff especially if thick/cloudy/opaque mucous/wheezing/fever.  Patient verbalized understanding information/instructions, agreed with plan of care and had no further questions at this time.   No evidence of invasive bacterial infection, non toxic and well hydrated.  I do not see where any further testing or imaging is necessary at this time.   I will suggest supportive care, rest, good hygiene and encourage the patient to take adequate fluids.  The patient is to return to clinic or EMERGENCY ROOM if symptoms worsen or change significantly e.g. ear pain, fever, purulent discharge from ears or bleeding.  Exitcare handout on otitis media and eustachian tube dysfunction printed and given to patient.  Discussed with patient post nasal drip irritates throat/causes swelling blocks eustachian tubes from draining and fluid fills up middle ear.  Bacteria/viruses can grow in fluid and with moving head tube compressed and increases pressure in tube/ear worsening pain.  Studies show will take 30 days for fluid to resolve after post nasal drip controlled with nasal steroid/antihistamine. Antibiotics and steroids do not speed up fluid removal.  Patient verbalized agreement and understanding of treatment plan and had no further questions at this time.

## 2021-02-09 ENCOUNTER — Telehealth: Payer: Self-pay | Admitting: Registered Nurse

## 2021-02-09 ENCOUNTER — Encounter: Payer: Self-pay | Admitting: Registered Nurse

## 2021-02-09 DIAGNOSIS — J018 Other acute sinusitis: Secondary | ICD-10-CM

## 2021-02-09 MED ORDER — SALINE SPRAY 0.65 % NA SOLN: 2.0000 | NASAL | 0 refills | Status: DC

## 2021-02-09 NOTE — Telephone Encounter (Signed)
Patient seen in workcenter complains of burning in nose/sinuses when bending head forward.  Has been using nasal saline more than once a day on regular basis now.  "Feels like when you get water up your nose and it burns" when I lean forward now.  Discussed with patient may be using too much nasal saline and salt causing irritation of her nasal and sinus passages.  Decrease volume of nasal saline and frequency per day to see if symptoms improve.  Follow up with clinic staff if nose bleeds, worsening swelling/congestion or discharge purulent.  Patient verbalized understanding information/instructions, agreed with plan of care and had no further questions at this time.

## 2021-02-20 ENCOUNTER — Ambulatory Visit: Payer: Self-pay | Admitting: *Deleted

## 2021-02-20 ENCOUNTER — Other Ambulatory Visit: Payer: Self-pay

## 2021-02-20 VITALS — BP 130/70 | Ht 65.0 in | Wt 239.0 lb

## 2021-02-20 DIAGNOSIS — Z0000 Encounter for general adult medical examination without abnormal findings: Secondary | ICD-10-CM

## 2021-02-20 NOTE — Progress Notes (Signed)
BP/HT/Wt for Be Well eval.

## 2021-03-06 ENCOUNTER — Telehealth: Payer: Self-pay | Admitting: Registered Nurse

## 2021-03-06 ENCOUNTER — Encounter: Payer: Self-pay | Admitting: Registered Nurse

## 2021-03-06 DIAGNOSIS — U071 COVID-19: Secondary | ICD-10-CM

## 2021-03-07 NOTE — Telephone Encounter (Signed)
Contacted pt by phone. She sent an email to [employer] last night 8/29 notifying that she tested positive for Covid. She reports she started having a cough with a bad taste to it at work yesterday. Had tested earlier in the week due to just feeling dizzy,  "funny feeling" and it was negative. Because of this and the cough, she tested again and it was positive. Spouse also positive. Pt denies any other sx for herself and spouse is asymptomatic.   Day 0 8/29 Day 5 9/3 RTW next scheduled workday of 9/4 with strict mask use thru Day 10 9/8.

## 2021-03-07 NOTE — Telephone Encounter (Signed)
Reviewed RN Rolly Salter note agreed with plan of care will follow up with patient tomorrow and discuss antivirals as multiple risk factors.

## 2021-03-08 MED ORDER — FLUTICASONE PROPIONATE 50 MCG/ACT NA SUSP: 1.0000 | Freq: Two times a day (BID) | NASAL | 1 refills | Status: DC

## 2021-03-08 NOTE — Telephone Encounter (Signed)
Patient contacted via telephone stated cough resolved just has nasal congestion/runny nose at this time.  Discussed antivirals for covid due to risk factors age/hypertension/obesity/diabetes and patient refused at this time.  A&Ox3 spoke full sentences without difficulty no cough/throat clearing only nasal congestion noted during 4 minute telephone call.  Reiterated mask wear through day 10 03/16/21 when around others and no eating in employee lunch room.  Estimated RTW 03/12/21  Spouse positive for covid also.  No others living in household. Sanitize high touch surfaces with lysol/chlorox/bleach spray or wipes daily as viruses are known to live on surfaces from 24 hours to days. Discussed may use flonase nasal 1 spray each nostril BID prn rhinitis.  Dayquil and nyquil per manufacturer instructions.  Honey 1 tablespoon every 4 hours is a natural cough suppressant but caution due to her diabetes.  Avoid dehydration and drink water to keep urine pale yellow clear and voiding every 2-4 hours while awake.  Recommended nap during day if fatigued and goal 7 hours sleep per night and regular meals  Pt verbalized understanding and agreement with plan of care. No further questions/concerns at this time. Pt reminded to contact clinic with any changes in symptoms or questions/concerns.  Discussed antivirals can be started during first 5 days of symptoms/positive test. HR notified no change to plan of care estimated RTW 9/4 Day 10 9/8 strict mask wear through Day 10 and no eating in employee lunch room.

## 2021-03-10 NOTE — Telephone Encounter (Signed)
Called to check in on Day 5 and before the long weekend. No answer. LVMRCB and also advising of weekend f/u by Inetta Fermo.

## 2021-03-10 NOTE — Telephone Encounter (Signed)
Pt returned call. Reports sx are improved. Only mild cough intermittent during the day now present. Not keeping her up at night.  She reports retesting today with home test and still testing positive. RN discussed with NP Inetta Fermo and instructions given and passed to pt that she can still RTW 9/6 as planned with strict mask use thru Day 10 9/8. Denies further questions or concerns.

## 2021-03-11 NOTE — Telephone Encounter (Signed)
Reviewed RN Rolly Salter note and agreed with plan of care.  RN Rolly Salter discussed case with me yesterday via telephone.  Patient can return as long as symptoms improved and fever free 24 hours prior to 6 Sep.  Will follow up with patient via telephone this weekend.

## 2021-03-13 NOTE — Telephone Encounter (Signed)
Patient returned call stated still having having intermittent cough.  If doesn't continue to improve she will schedule follow up appt with me or PCM.  Ready to return to work tomorrow.  Discussed strict mask wear and eating in car/outside or meditation room through 03/16/21.  Patient A&Ox3 spoke full sentences without difficulty  during 3 minute telephone call.  No cough/congestion or throat clearing audible.  HR notified patient cleared to return onsite with strict mask wear.  Patient verbalized understanding information/instructions, agreed with plan of care and had no further questions at this time.

## 2021-03-15 NOTE — Telephone Encounter (Signed)
Patient contacted via telephone stated work went well yesterday.  Today having some changes in taste and smell.  Discussed with patient that can happen with covid if long term problem consider covid booster dose if lasting weeks.  If having sinus symptoms in addition to smell/taste problems could be sinusitis.  Patient reported occasional cough persists also.  HR notified patient returned to work with strict mask wear through day 10 03/16/21  Patient A&Ox3 spoke full sentences without difficulty no cough/congestion/throat clearing audible during 2 minute telephone call..  Patient verbalized understanding information/instructions, agreed with plan of care and had no further questions at this time.

## 2021-03-24 NOTE — Telephone Encounter (Signed)
Patient contacted via telephone stated taste and smell back to normal and denied sinus problems. Still some cough but patient states back to her baseline/normal year round.  Denied concerns or questions at this time.  Denied any needs for med refills.  Patient A&Ox3 spoke full sentences without difficulty no cough/congestion/throat clearing audible duration of call 2 minutes.  Encounter closed.

## 2021-04-20 ENCOUNTER — Ambulatory Visit: Payer: Self-pay | Admitting: Registered Nurse

## 2021-04-20 ENCOUNTER — Other Ambulatory Visit: Payer: Self-pay

## 2021-04-20 VITALS — BP 130/72 | HR 98 | Temp 98.4°F

## 2021-04-20 DIAGNOSIS — J3089 Other allergic rhinitis: Secondary | ICD-10-CM

## 2021-04-20 DIAGNOSIS — J209 Acute bronchitis, unspecified: Secondary | ICD-10-CM

## 2021-04-20 MED ORDER — PREDNISONE 10 MG PO TABS: ORAL_TABLET | ORAL | 0 refills | Status: AC

## 2021-04-20 MED ORDER — MONTELUKAST SODIUM 10 MG PO TABS: 10.0000 mg | ORAL_TABLET | Freq: Every day | ORAL | 3 refills | Status: DC
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
## 2021-04-20 NOTE — Patient Instructions (Signed)
Nonallergic Rhinitis Nonallergic rhinitis is inflammation of the mucous membrane inside the nose. The mucous membrane is the tissue that produces mucus. This condition is different from having allergic rhinitis, which is an allergy that affects the nose. Allergic rhinitis occurs when the body's defense system, or immune system, reacts to a substance that a person is allergic to (allergen), such as pollen, pet dander, mold, or dust. Nonallergic rhinitis has many similar symptoms, but it is not caused by allergens. Nonallergic rhinitis can be an acute or chronic problem. This means it can be short-term or long-term. What are the causes? This condition may be caused by many different things. Some common types of nonallergic rhinitis include: Infectious rhinitis. This is usually caused by an infection in the nose, throat, or upper airways (upper respiratory system). Vasomotor rhinitis. This is the most common type of chronic nonallergic rhinitis. It is caused by too much blood flow through your nose, and it leads to swelling in your nose. It is triggered by strong odors, cold air, stress, drinking alcohol, cigarette smoke, or changes in the weather. Occupational rhinitis. This type is caused by triggers in the workplace, such as chemicals, dust, animal dander, or air pollution. Hormonal rhinitis, in teenage girls and women. This type is caused by an increase in the hormone estrogen and may happen during pregnancy, puberty, or monthly menstrual periods. Hormonal rhinitis gives you fewer symptoms when estrogen levels drop. Drug-induced rhinitis. Several types of medicines can cause this, such as medicines for high blood pressure or heart disease, aspirin, or NSAIDs. Nonallergic rhinitis with eosinophilia syndrome (NARES). This type is caused by having too much eosinophil, a type of white blood cell. Other causes include a reaction to eating hot or spicy foods. This does not usually cause long-term symptoms. In  some cases, the cause of nonallergic rhinitis is not known. What increases the risk? You are more likely to develop this condition if: You are 53-60 years of age. You are a woman. Women are twice as likely to have this condition. What are the signs or symptoms? Common symptoms of this condition include: Stuffy nose (nasal congestion). Runny nose. A feeling of mucus dripping down the back of your throat (postnasal drip). Trouble sleeping. Tiredness, or fatigue. Other symptoms include: Sneezing. Coughing. Itchy nose. Bloodshot eyes. How is this diagnosed? This type may be diagnosed based on: Your symptoms and medical history. A physical exam. Allergy testing to rule out allergic rhinitis. You may have skin tests or blood tests. Your health care provider may also take a swab of nasal discharge to look for an increased number of eosinophils. This would be done to confirm a diagnosis of NARES. How is this treated? Treatment for this condition depends on the cause. No single treatment works for everyone. Work with your health care provider to find the best treatment for you. Treatment may include: Avoiding the things that trigger your symptoms. Medicines to relieve congestion, such as: Steroid nasal spray. There are many types. You may need to try a few to find out which one works best. Best boy medicine. This treats nasal congestion and may be given by mouth or as a nasal spray. These medicines are used only for a short time. Medicines to relieve a runny nose. These may include antihistamine medicines or anticholinergic nasal sprays. Nasal irrigation. This involves using a salt-water (saline) spray or saline container called a neti pot. Nasal irrigation helps to clear away mucus and keep your nasal passages moist. Surgery to remove part  of your mucous membrane. This is done in severe cases if the condition has not improved after 6-12 months of treatment. Follow these instructions at  home: Medicines Take or use over-the-counter and prescription medicines only as told by your health care provider. Do not stop using your medicine even if you start to feel better. Do not take NSAIDs, such as ibuprofen, or medicines that contain aspirin if they make your symptoms worse. Lifestyle Do not drink alcohol if it makes your symptoms worse. Do not use any products that contain nicotine or tobacco, such as cigarettes, e-cigarettes, and chewing tobacco. If you need help quitting, ask your health care provider. Avoid secondhand smoke. General instructions Avoid triggers that make your symptoms worse. Use nasal irrigation as told by your health care provider. Get exercise. Exercise may help reduce symptoms for some people. Sleep with the head of your bed raised. This may reduce nasal congestion when you sleep. Drink enough fluid to keep your urine pale yellow. Keep all follow-up visits as told by your health care provider. This is important. Contact a health care provider if: You have a fever. Your symptoms are getting worse at home. Your symptoms do not lessen with medicine. You develop new symptoms, especially a headache or nosebleed. Summary Nonallergic rhinitis is inflammation inside the nose that is not caused by allergens. Nonallergic rhinitis can be a short-term or long-term problem. Treatment may include avoiding the things that trigger your symptoms. Take or use over-the-counter and prescription medicines only as told by your health care provider. Do not stop using your medicine even if you start to feel better. Contact a health care provider if your symptoms do not lessen with medicine. This information is not intended to replace advice given to you by your health care provider. Make sure you discuss any questions you have with your health care provider. Document Revised: 05/04/2019 Document Reviewed: 05/04/2019 Elsevier Patient Education  2022 [employer] Allergic  Rhinitis, Adult Allergic rhinitis is an allergic reaction that affects the mucous membrane inside the nose. The mucous membrane is the tissue that produces mucus. There are two types of allergic rhinitis: Seasonal. This type is also called hay fever and happens only during certain seasons. Perennial. This type can happen at any time of the year. Allergic rhinitis cannot be spread from person to person. This condition can be mild, moderate, or severe. It can develop at any age and may be outgrown. What are the causes? This condition is caused by allergens. These are things that can cause an allergic reaction. Allergens may differ for seasonal allergic rhinitis and perennial allergic rhinitis. Seasonal allergic rhinitis is triggered by pollen. Pollen can come from grasses, trees, and weeds. Perennial allergic rhinitis may be triggered by: Dust mites. Proteins in a pet's urine, saliva, or dander. Dander is dead skin cells from a pet. Smoke, mold, or car fumes. What increases the risk? You are more likely to develop this condition if you have a family history of allergies or other conditions related to allergies, including: Allergic conjunctivitis. This is inflammation of parts of the eyes and eyelids. Asthma. This condition affects the lungs and makes it hard to breathe. Atopic dermatitis or eczema. This is long term (chronic) inflammation of the skin. Food allergies. What are the signs or symptoms? Symptoms of this condition include: Sneezing or coughing. A stuffy nose (nasal congestion), itchy nose, or nasal discharge. Itchy eyes and tearing of the eyes. A feeling of mucus dripping down the back of your  throat (postnasal drip). Trouble sleeping. Tiredness or fatigue. Headache. Sore throat. How is this diagnosed? This condition may be diagnosed with your symptoms, medical history, and physical exam. Your health care provider may check for related conditions, such as: Asthma. Pink eye.  This is eye inflammation caused by infection (conjunctivitis). Ear infection. Upper respiratory infection. This is an infection in the nose, throat, or upper airways. You may also have tests to find out which allergens trigger your symptoms. These may include skin tests or blood tests. How is this treated? There is no cure for this condition, but treatment can help control symptoms. Treatment may include: Taking medicines that block allergy symptoms, such as corticosteroids and antihistamines. Medicine may be given as a shot, nasal spray, or pill. Avoiding any allergens. Being exposed again and again to tiny amounts of allergens to help you build a defense against allergens (immunotherapy). This is done if other treatments have not helped. It may include: Allergy shots. These are injected medicines that have small amounts of allergen in them. Sublingual immunotherapy. This involves taking small doses of a medicine with allergen in it under your tongue. If these treatments do not work, your health care provider may prescribe newer, stronger medicines. Follow these instructions at home: Avoiding allergens Find out what you are allergic to and avoid those allergens. These are some things you can do to help avoid allergens: If you have perennial allergies: Replace carpet with wood, tile, or vinyl flooring. Carpet can trap dander and dust. Do not smoke. Do not allow smoking in your home. Change your heating and air conditioning filters at least once a month. If you have seasonal allergies, take these steps during allergy season: Keep windows closed as much as possible. Plan outdoor activities when pollen counts are lowest. Check pollen counts before you plan outdoor activities. When coming indoors, change clothing and shower before sitting on furniture or bedding. If you have a pet in the house that produces allergens: Keep the pet out of the bedroom. Vacuum, sweep, and dust regularly. General  instructions Take over-the-counter and prescription medicines only as told by your health care provider. Drink enough fluid to keep your urine pale yellow. Keep all follow-up visits as told by your health care provider. This is important. Where to find more information American Academy of [employer], Asthma & Immunology: www.aaaai.org Contact a health care provider if: You have a fever. You develop a cough that does not go away. You make whistling sounds when you breathe (wheeze). Your symptoms slow you down or stop you from doing your normal activities each day. Get help right away if: You have shortness of breath. This symptom may represent a serious problem that is an emergency. Do not wait to see if the symptom will go away. Get medical help right away. Call your local emergency services (911 in the [place] Do not drive yourself to the hospital. Summary Allergic rhinitis may be managed by taking medicines as directed and avoiding allergens. If you have seasonal allergies, keep windows closed as much as possible during allergy season. Contact your health care provider if you develop a fever or a cough that does not go away. This information is not intended to replace advice given to you by your health care provider. Make sure you discuss any questions you have with your health care provider. Document Revised: 08/14/2019 Document Reviewed: 06/23/2019 Elsevier Patient Education  2022 [employer] Acute Bronchitis, Adult Acute bronchitis is sudden or acute swelling of the air tubes (  bronchi) in the lungs. Acute bronchitis causes these tubes to fill with mucus, which can make it hard to breathe. It can also cause coughing or wheezing. In adults, acute bronchitis usually goes away within 2 weeks. A cough caused by bronchitis may last up to 3 weeks. Smoking, allergies, and asthma can make the condition worse. What are the causes? This condition can be caused by germs and by substances that  irritate the lungs, including: Cold and flu viruses. The most common cause of this condition is the virus that causes the common cold. Bacteria. Substances that irritate the lungs, including: Smoke from cigarettes and other forms of tobacco. Dust and pollen. Fumes from chemical products, gases, or burned fuel. Other materials that pollute indoor or outdoor air. Close contact with someone who has acute bronchitis. What increases the risk? The following factors may make you more likely to develop this condition: A weak body's defense system, also called the immune system. A condition that affects your lungs and breathing, such as asthma. What are the signs or symptoms? Common symptoms of this condition include: Lung and breathing problems, such as: Coughing. This may bring up clear, yellow, or green mucus from your lungs (sputum). Wheezing. Having too much mucus in your lungs (chest congestion). Having shortness of breath. A fever. Chills. Aches and pains, including: Tightness in your chest and other body aches. A sore throat. How is this diagnosed? This condition is usually diagnosed based on: Your symptoms and medical history. A physical exam. You may also have other tests, including tests to rule out other conditions, such as pneumonia. These tests include: A test of lung function. Test of a mucus sample to look for the presence of bacteria. Tests to check the oxygen level in your blood. Blood tests. Chest X-ray. How is this treated? Most cases of acute bronchitis clear up over time without treatment. Your health care provider may recommend: Drinking more fluids. This can thin your mucus, which may improve your breathing. Using a device that gets medicine into your lungs (inhaler) to help improve breathing and control coughing. Using a vaporizer or a humidifier. These are machines that add water to the air to help you breathe better. Taking a medicine for a fever. Taking a  medicine that thins mucus and clears congestion (expectorant). Taking a medicine that prevents or stops coughing (cough suppressant). Follow these instructions at home: Activity Get plenty of rest. Return to your normal activities as told by your health care provider. Ask your health care provider what activities are safe for you. Lifestyle  Drink enough fluid to keep your urine pale yellow. Do not drink alcohol. Do not use any products that contain nicotine or tobacco, such as cigarettes, e-cigarettes, and chewing tobacco. If you need help quitting, ask your health care provider. Be aware that: Your bronchitis will get worse if you smoke or breathe in other people's smoke (secondhand smoke). Your lungs will heal faster if you quit smoking. General instructions Take over-the-counter and prescription medicines only as told by your health care provider. Use an inhaler, vaporizer, or humidifier as told by your health care provider. If you have a sore throat, gargle with a salt-water mixture 3-4 times a day or as needed. To make a salt-water mixture, completely dissolve -1 tsp (3-6 g) of salt in 1 cup (237 mL) of warm water. Take two teaspoons of honey at bedtime to lessen coughing at night. Keep all follow-up visits as told by your health care provider. This is  important. How is this prevented? To lower your risk of getting this condition again: Wash your hands often with soap and water. If soap and water are not available, use hand sanitizer. Avoid contact with people who have cold symptoms. Try not to touch your mouth, nose, or eyes with your hands. Avoid places where there are fumes from chemicals. Breathing these fumes will make your condition worse. Get the flu shot every year. Contact a health care provider if: Your symptoms do not improve after 2 weeks of treatment. You vomit more than once or twice. You have symptoms of dehydration such as: Dark urine. Dry skin or  eyes. Increased thirst. Headaches. Confusion. Muscle cramps. Get help right away if you: Cough up blood. Feel pain in your chest. Have severe shortness of breath. Faint or keep feeling like you are going to faint. Have a severe headache. Have fever or chills that get worse. These symptoms may represent a serious problem that is an emergency. Do not wait to see if the symptoms will go away. Get medical help right away. Call your local emergency services (911 in the [place] Do not drive yourself to the hospital. Summary Acute bronchitis is sudden (acute) inflammation of the air tubes (bronchi) between the windpipe and the lungs. In adults, acute bronchitis usually goes away within 2 weeks, although coughing may last 3 weeks or longer. Take over-the-counter and prescription medicines only as told by your health care provider. Drink enough fluid to keep your urine pale yellow. Contact a health care provider if your symptoms do not improve after 2 weeks of treatment. Get help right away if you cough up blood, faint, or have chest pain or shortness of breath. This information is not intended to replace advice given to you by your health care provider. Make sure you discuss any questions you have with your health care provider. Document Revised: 05/25/2020 Document Reviewed: 01/16/2019 Elsevier Patient Education  2022 [employer] Cough, Adult Coughing is a reflex that clears your throat and your airways (respiratory system). Coughing helps to heal and protect your lungs. It is normal to cough occasionally, but a cough that happens with other symptoms or lasts a long time may be a sign of a condition that needs treatment. An acute cough may only last 2-3 weeks, while a chronic cough may last 8 or more weeks. Coughing is commonly caused by: Infection of the respiratory systemby viruses or bacteria. Breathing in substances that irritate your lungs. Allergies. Asthma. Mucus that runs down the  back of your throat (postnasal drip). Smoking. Acid backing up from the stomach into the esophagus (gastroesophageal reflux). Certain medicines. Chronic lung problems. Other medical conditions such as heart failure or a blood clot in the lung (pulmonary embolism). Follow these instructions at home: Medicines Take over-the-counter and prescription medicines only as told by your health care provider. Talk with your health care provider before you take a cough suppressant medicine. Lifestyle  Avoid cigarette smoke. Do not use any products that contain nicotine or tobacco, such as cigarettes, e-cigarettes, and chewing tobacco. If you need help quitting, ask your health care provider. Drink enough fluid to keep your urine pale yellow. Avoid caffeine. Do not drink alcohol if your health care provider tells you not to drink. General instructions  Pay close attention to changes in your cough. Tell your health care provider about them. Always cover your mouth when you cough. Avoid things that make you cough, such as perfume, candles, cleaning products, or campfire or  tobacco smoke. If the air is dry, use a cool mist vaporizer or humidifier in your bedroom or your home to help loosen secretions. If your cough is worse at night, try to sleep in a semi-upright position. Rest as needed. Keep all follow-up visits as told by your health care provider. This is important. Contact a health care provider if you: Have new symptoms. Cough up pus. Have a cough that does not get better after 2-3 weeks or gets worse. Cannot control your cough with cough suppressant medicines and you are losing sleep. Have pain that gets worse or pain that is not helped with medicine. Have a fever. Have unexplained weight loss. Have night sweats. Get help right away if: You cough up blood. You have difficulty breathing. Your heartbeat is very fast. These symptoms may represent a serious problem that is an emergency. Do  not wait to see if the symptoms will go away. Get medical help right away. Call your local emergency services (911 in the [place] Do not drive yourself to the hospital. Summary Coughing is a reflex that clears your throat and your airways. It is normal to cough occasionally, but a cough that happens with other symptoms or lasts a long time may be a sign of a condition that needs treatment. Take over-the-counter and prescription medicines only as told by your health care provider. Always cover your mouth when you cough. Contact a health care provider if you have new symptoms or a cough that does not get better after 2-3 weeks or gets worse. This information is not intended to replace advice given to you by your health care provider. Make sure you discuss any questions you have with your health care provider. Document Revised: 07/14/2018 Document Reviewed: 07/14/2018 Elsevier Patient Education  2022 [employer].

## 2021-07-20 ENCOUNTER — Other Ambulatory Visit: Payer: Self-pay

## 2021-07-20 ENCOUNTER — Other Ambulatory Visit: Payer: Self-pay | Admitting: *Deleted

## 2021-07-20 DIAGNOSIS — E1169 Type 2 diabetes mellitus with other specified complication: Secondary | ICD-10-CM

## 2021-07-20 DIAGNOSIS — E790 Hyperuricemia without signs of inflammatory arthritis and tophaceous disease: Secondary | ICD-10-CM

## 2021-07-20 DIAGNOSIS — Z0000 Encounter for general adult medical examination without abnormal findings: Secondary | ICD-10-CM

## 2021-07-20 DIAGNOSIS — E559 Vitamin D deficiency, unspecified: Secondary | ICD-10-CM

## 2021-07-20 DIAGNOSIS — R7401 Elevation of levels of liver transaminase levels: Secondary | ICD-10-CM

## 2021-07-20 NOTE — Progress Notes (Signed)
Fasting labs ahead of CPE with pcp next week.

## 2021-07-21 ENCOUNTER — Encounter: Payer: Self-pay | Admitting: Registered Nurse

## 2021-07-21 LAB — CMP12+LP+TP+TSH+6AC+CBC/D/PLT
ALT: 47 IU/L — ABNORMAL HIGH (ref 0–32)
AST: 29 IU/L (ref 0–40)
Albumin/Globulin Ratio: 1.9 (ref 1.2–2.2)
Albumin: 4.5 g/dL (ref 3.8–4.9)
Alkaline Phosphatase: 99 IU/L (ref 44–121)
BUN/Creatinine Ratio: 27 — ABNORMAL HIGH (ref 9–23)
BUN: 22 mg/dL (ref 6–24)
Basophils Absolute: 0 10*3/uL (ref 0.0–0.2)
Basos: 1 %
Bilirubin Total: 0.3 mg/dL (ref 0.0–1.2)
Calcium: 9.6 mg/dL (ref 8.7–10.2)
Chloride: 98 mmol/L (ref 96–106)
Chol/HDL Ratio: 3.5 ratio (ref 0.0–4.4)
Cholesterol, Total: 203 mg/dL — ABNORMAL HIGH (ref 100–199)
Creatinine, Ser: 0.81 mg/dL (ref 0.57–1.00)
EOS (ABSOLUTE): 0.3 10*3/uL (ref 0.0–0.4)
Eos: 4 %
Estimated CHD Risk: 0.6 times avg. (ref 0.0–1.0)
Free Thyroxine Index: 1.8 (ref 1.2–4.9)
GGT: 44 IU/L (ref 0–60)
Globulin, Total: 2.4 g/dL (ref 1.5–4.5)
Glucose: 113 mg/dL — ABNORMAL HIGH (ref 70–99)
HDL: 58 mg/dL (ref >39)
Hematocrit: 43.9 % (ref 34.0–46.6)
Hemoglobin: 14.4 g/dL (ref 11.1–15.9)
Immature Grans (Abs): 0 10*3/uL (ref 0.0–0.1)
Immature Granulocytes: 0 %
Iron: 69 ug/dL (ref 27–159)
LDH: 183 IU/L (ref 119–226)
LDL Chol Calc (NIH): 121 mg/dL — ABNORMAL HIGH (ref 0–99)
Lymphocytes Absolute: 1.8 10*3/uL (ref 0.7–3.1)
Lymphs: 29 %
MCH: 27.3 pg (ref 26.6–33.0)
MCHC: 32.8 g/dL (ref 31.5–35.7)
MCV: 83 fL (ref 79–97)
Monocytes Absolute: 0.5 10*3/uL (ref 0.1–0.9)
Monocytes: 7 %
Neutrophils Absolute: 3.7 10*3/uL (ref 1.4–7.0)
Neutrophils: 59 %
Phosphorus: 4.3 mg/dL (ref 3.0–4.3)
Platelets: 406 10*3/uL (ref 150–450)
Potassium: 4.6 mmol/L (ref 3.5–5.2)
RBC: 5.28 x10E6/uL (ref 3.77–5.28)
RDW: 13.2 % (ref 11.7–15.4)
Sodium: 139 mmol/L (ref 134–144)
T3 Uptake Ratio: 24 % (ref 24–39)
T4, Total: 7.5 ug/dL (ref 4.5–12.0)
TSH: 1.18 u[IU]/mL (ref 0.450–4.500)
Total Protein: 6.9 g/dL (ref 6.0–8.5)
Triglycerides: 138 mg/dL (ref 0–149)
Uric Acid: 9 mg/dL — ABNORMAL HIGH (ref 3.0–7.2)
VLDL Cholesterol Cal: 24 mg/dL (ref 5–40)
WBC: 6.3 10*3/uL (ref 3.4–10.8)
eGFR: 85 mL/min/{1.73_m2} (ref >59)

## 2021-07-21 LAB — HGB A1C W/O EAG: Hgb A1c MFr Bld: 6.8 % — ABNORMAL HIGH (ref 4.8–5.6)

## 2021-07-21 LAB — MAGNESIUM: Magnesium: 2 mg/dL (ref 1.6–2.3)

## 2021-07-21 LAB — VITAMIN D 25 HYDROXY (VIT D DEFICIENCY, FRACTURES): Vit D, 25-Hydroxy: 37.2 ng/mL (ref 30.0–100.0)

## 2021-07-21 NOTE — Progress Notes (Signed)
Results reviewed with pt over phone. No s/sx or Hx of gout. Reviewed s/sx in case they present. ALT slightly worse from 6 months ago. Denies increase in etoh use, or new vitamins/supplements. Is taking mobic for 14 days. LDL and total chol slightly worse than previous. Reviewed foods to avoid again. Declines handouts. Seeing pcp next week. Pt pleased A1c same as previous, no worse. Mag and Vit D normal. Pt denies any questions or concerns. Results routed to pcp.

## 2021-07-21 NOTE — Addendum Note (Signed)
Addended by: Albina Billet A on: 07/21/2021 10:34 AM   Modules accepted: Orders

## 2021-07-26 ENCOUNTER — Other Ambulatory Visit: Payer: Self-pay | Admitting: Family Medicine

## 2021-07-26 DIAGNOSIS — Z1231 Encounter for screening mammogram for malignant neoplasm of breast: Secondary | ICD-10-CM

## 2021-08-01 ENCOUNTER — Other Ambulatory Visit: Payer: Self-pay

## 2021-08-01 ENCOUNTER — Ambulatory Visit
Admission: RE | Admit: 2021-08-01 | Discharge: 2021-08-01 | Disposition: A | Discharge status: Home / Self Care | Payer: No Typology Code available for payment source | Source: Ambulatory Visit | Attending: Family Medicine | Admitting: Family Medicine

## 2021-08-01 DIAGNOSIS — Z1231 Encounter for screening mammogram for malignant neoplasm of breast: Secondary | ICD-10-CM

## 2021-08-10 ENCOUNTER — Encounter: Payer: Self-pay | Admitting: Registered Nurse

## 2021-08-10 ENCOUNTER — Ambulatory Visit: Payer: Self-pay | Admitting: Registered Nurse

## 2021-08-10 ENCOUNTER — Other Ambulatory Visit: Payer: Self-pay

## 2021-08-10 VITALS — Resp 16

## 2021-08-10 DIAGNOSIS — M25561 Pain in right knee: Secondary | ICD-10-CM

## 2021-08-10 DIAGNOSIS — M76891 Other specified enthesopathies of right lower limb, excluding foot: Secondary | ICD-10-CM

## 2021-08-10 MED ORDER — DICLOFENAC SODIUM 75 MG PO TBEC: 75.0000 mg | DELAYED_RELEASE_TABLET | Freq: Two times a day (BID) | ORAL | 0 refills | Status: AC | PRN

## 2021-08-10 NOTE — Progress Notes (Signed)
Subjective:    Patient ID: Christine Rojas, female    DOB: 06/10/1966, 56 y.o.   MRN: [id number]  55y/o caucasian female established patient with worsening right knee pain with sitting and standing.   If medially leaning towards floor worsens pain right knee.  Uses hand to realign leg and feels better when sitting at work.   Has been taking gabapentin but not helping.  Doesn't have any mobic or diclofenac remaining would need refill.  Denied swelling/trauma/bruising or rash.     Review of Systems  Constitutional:  Negative for activity change, appetite change, chills, diaphoresis, fatigue and fever.  HENT:  Negative for trouble swallowing and voice change.   Eyes:  Negative for discharge and itching.  Respiratory:  Negative for cough, shortness of breath, wheezing and stridor.   Cardiovascular:  Negative for leg swelling.  Gastrointestinal:  Negative for diarrhea, nausea and vomiting.  Endocrine: Negative for cold intolerance and heat intolerance.  Genitourinary:  Negative for difficulty urinating.  Musculoskeletal:  Positive for arthralgias and gait problem. Negative for joint swelling, neck pain and neck stiffness.  Skin:  Negative for rash.  Allergic/Immunologic: Positive for environmental allergies.  Neurological:  Negative for tremors and weakness.  Hematological:  Negative for adenopathy. Does not bruise/bleed easily.  Psychiatric/Behavioral:  Negative for agitation, confusion and sleep disturbance.       Objective:   Physical Exam Vitals and nursing note reviewed.  Constitutional:      General: She is awake. She is not in acute distress.    Appearance: Normal appearance. She is well-developed and well-groomed. She is obese. She is not ill-appearing, toxic-appearing or diaphoretic.  HENT:     Head: Normocephalic and atraumatic.     Jaw: There is normal jaw occlusion.     Salivary Glands: Right salivary gland is not diffusely enlarged. Left salivary gland is not diffusely  enlarged.     Right Ear: Hearing and external ear normal.     Left Ear: Hearing and external ear normal.     Nose: Nose normal. No congestion or rhinorrhea.     Mouth/Throat:     Lips: Pink. No lesions.     Mouth: Mucous membranes are moist.     Pharynx: Oropharynx is clear.  Eyes:     General: Lids are normal. Vision grossly intact. Gaze aligned appropriately. Allergic shiner present. No scleral icterus.       Right eye: No discharge.        Left eye: No discharge.     Extraocular Movements: Extraocular movements intact.     Conjunctiva/sclera: Conjunctivae normal.     Pupils: Pupils are equal, round, and reactive to light.  Neck:     Trachea: Trachea and phonation normal.  Cardiovascular:     Rate and Rhythm: Normal rate and regular rhythm.     Pulses: Normal pulses.          Radial pulses are 2+ on the right side and 2+ on the left side.  Pulmonary:     Effort: Pulmonary effort is normal.     Breath sounds: Normal breath sounds and air entry. No stridor or transmitted upper airway sounds. No wheezing.     Comments: Spoke full sentences without difficulty; no cough observed in exam room Abdominal:     General: Abdomen is flat.  Musculoskeletal:        General: Tenderness present. No swelling or deformity.     Cervical back: Normal range of motion and neck supple.  No edema, erythema, signs of trauma, rigidity or crepitus. Normal range of motion.     Right knee: Crepitus present. No swelling, deformity, effusion, erythema, ecchymosis or lacerations. Decreased range of motion. Tenderness present over the patellar tendon. Abnormal alignment. Normal meniscus.     Left knee: No crepitus. Normal range of motion. No tenderness. No patellar tendon tenderness. Normal meniscus.     Right lower leg: No edema.     Left lower leg: No edema.     Comments: Crepitus with flexion/extension right knee audible and palpable; patient with tenderness medial patellar tendown palpation; negative bilateral  mcmurrary test; sitting in chair knees tend to hit each other medially and due to pain patient repositions knee with her hand frequently when sitting or prior to standing up in chair that swivels at workstation  Lymphadenopathy:     Head:     Right side of head: No submandibular or preauricular adenopathy.     Left side of head: No submandibular or preauricular adenopathy.     Cervical: No cervical adenopathy.     Right cervical: No superficial cervical adenopathy.    Left cervical: No superficial cervical adenopathy.  Skin:    General: Skin is warm and dry.     Capillary Refill: Capillary refill takes less than 2 seconds.     Coloration: Skin is not ashen, cyanotic, jaundiced, mottled, pale or sallow.     Findings: No abrasion, abscess, bruising, burn, ecchymosis, erythema, signs of injury, laceration, lesion, petechiae, rash or wound.     Nails: There is no clubbing.  Neurological:     General: No focal deficit present.     Mental Status: She is alert and oriented to person, place, and time. Mental status is at baseline.     GCS: GCS eye subscore is 4. GCS verbal subscore is 5. GCS motor subscore is 6.     Cranial Nerves: Cranial nerves 2-12 are intact. No cranial nerve deficit, dysarthria or facial asymmetry.     Sensory: Sensation is intact. No sensory deficit.     Motor: Motor function is intact. No weakness, tremor, atrophy, abnormal muscle tone or seizure activity.     Coordination: Coordination is intact. Coordination normal.     Gait: Gait is intact. Gait normal.     Comments: In/out of chair without difficulty; gait slow and steady; pain with repositioning legs to move chair from desk to stand up; bilateral hand grasp equal 5/5  Psychiatric:        Attention and Perception: Attention and perception normal.        Mood and Affect: Mood and affect normal.        Speech: Speech normal.        Behavior: Behavior normal. Behavior is cooperative.        Thought Content: Thought  content normal.        Cognition and Memory: Cognition and memory normal.        Judgment: Judgment normal.          Assessment & Plan:  A-patellar tendonitis right acute, right anterior knee pain acute  P-Discussed cold weather could be flaring arthritis.  Dispensed diclofenac 75mg  DR po BID prn pain #30 RF0.  Consider heat/ice 15 minutes QID prn pain.  Stretching and strengthening per printed and given to patient today.  Follow up in 2 weeks for re-evaluation or sooner if worsening.  Consider neoprene knee sleeve for support right to be fitted and distributed by [employer]  [facility].  May continue gabapentin from her other provider.  Discussed patellar tendonitis also occurring in right knee.  Crepitus from arthritis and tendonitis.  Gentle AROM daily.   Consider PT and orthopedics referral.  Weight loss recommended.   Patient verbalized understanding information/instructions, agreed with plan of care and had no further questions at this time.

## 2021-08-10 NOTE — Patient Instructions (Signed)
Osteoarthritis Osteoarthritis is a type of arthritis. It refers to joint pain or joint disease. Osteoarthritis affects tissue that covers the ends of bones in joints (cartilage). Cartilage acts as a cushion between the bones and helps them move smoothly. Osteoarthritis occurs when cartilage in the joints gets worn down. Osteoarthritis is sometimes called "wear and tear" arthritis. Osteoarthritis is the most common form of arthritis. It often occurs in older people. It is a condition that gets worse over time. The joints most often affected by this condition are in the fingers, toes, hips, knees, and spine, including the neck and lower back. What are the causes? This condition is caused by the wearing down of cartilage that covers the ends of bones. What increases the risk? The following factors may make you more likely to develop this condition: Being age 3 or older. Obesity. Overuse of joints. Past injury of a joint. Past surgery on a joint. Family history of osteoarthritis. What are the signs or symptoms? The main symptoms of this condition are pain, swelling, and stiffness in the joint. Other symptoms may include: An enlarged joint. More pain and further damage caused by small pieces of bone or cartilage that break off and float inside of the joint. Small deposits of bone (osteophytes) that grow on the edges of the joint. A grating or scraping feeling inside the joint when you move it. Popping or creaking sounds when you move. Difficulty walking or exercising. An inability to grip items, twist your hand(s), or control the movements of your hands and fingers. How is this diagnosed? This condition may be diagnosed based on: Your medical history. A physical exam. Your symptoms. X-rays of the affected joint(s). Blood tests to rule out other types of arthritis. How is this treated? There is no cure for this condition, but treatment can help control pain and improve joint function.  Treatment may include a combination of therapies, such as: Pain relief techniques, such as: Applying heat and cold to the joint. Massage. A form of talk therapy called cognitive behavioral therapy (CBT). This therapy helps you set goals and follow up on the changes that you make. Medicines for pain and inflammation. The medicines can be taken by mouth or applied to the skin. They include: NSAIDs, such as ibuprofen. Prescription medicines. Strong anti-inflammatory medicines (corticosteroids). Certain nutritional supplements. A prescribed exercise program. You may work with a physical therapist. Assistive devices, such as a brace, wrap, splint, specialized glove, or cane. A weight control plan. Surgery, such as: An osteotomy. This is done to reposition the bones and relieve pain or to remove loose pieces of bone and cartilage. Joint replacement surgery. You may need this surgery if you have advanced osteoarthritis. Follow these instructions at home: Activity Rest your affected joints as told by your health care provider. Exercise as told by your health care provider. He or she may recommend specific types of exercise, such as: Strengthening exercises. These are done to strengthen the muscles that support joints affected by arthritis. Aerobic activities. These are exercises, such as brisk walking or water aerobics, that increase your heart rate. Range-of-motion activities. These help your joints move more easily. Balance and agility exercises. Managing pain, stiffness, and swelling   If directed, apply heat to the affected area as often as told by your health care provider. Use the heat source that your health care provider recommends, such as a moist heat pack or a heating pad. If you have a removable assistive device, remove it as told by  your health care provider. Place a towel between your skin and the heat source. If your health care provider tells you to keep the assistive device on  while you apply heat, place a towel between the assistive device and the heat source. Leave the heat on for 20-30 minutes. Remove the heat if your skin turns bright red. This is especially important if you are unable to feel pain, heat, or cold. You may have a greater risk of getting burned. If directed, put ice on the affected area. To do this: If you have a removable assistive device, remove it as told by your health care provider. Put ice in a plastic bag. Place a towel between your skin and the bag. If your health care provider tells you to keep the assistive device on during icing, place a towel between the assistive device and the bag. Leave the ice on for 20 minutes, 2-3 times a day. Move your fingers or toes often to reduce stiffness and swelling. Raise (elevate) the injured area above the level of your heart while you are sitting or lying down. General instructions Take over-the-counter and prescription medicines only as told by your health care provider. Maintain a healthy weight. Follow instructions from your health care provider for weight control. Do not use any products that contain nicotine or tobacco, such as cigarettes, e-cigarettes, and chewing tobacco. If you need help quitting, ask your health care provider. Use assistive devices as told by your health care provider. Keep all follow-up visits as told by your health care provider. This is important. Where to find more information [employer] of Arthritis and Musculoskeletal and Skin Diseases: www.niams.http://www.myers.net/ [employer] on Aging: https://walker.com/ American College of Rheumatology: www.rheumatology.org Contact a health care provider if: You have redness, swelling, or a feeling of warmth in a joint that gets worse. You have a fever along with joint or muscle aches. You develop a rash. You have trouble doing your normal activities. Get help right away if: You have pain that gets worse and is not relieved by  pain medicine. Summary Osteoarthritis is a type of arthritis that affects tissue covering the ends of bones in joints (cartilage). This condition is caused by the wearing down of cartilage that covers the ends of bones. The main symptom of this condition is pain, swelling, and stiffness in the joint. There is no cure for this condition, but treatment can help control pain and improve joint function. This information is not intended to replace advice given to you by your health care provider. Make sure you discuss any questions you have with your health care provider. Document Revised: 06/22/2019 Document Reviewed: 06/22/2019 Elsevier Patient Education  2022 [employer] Patellar Tendinitis Rehab Ask your health care provider which exercises are safe for you. Do exercises exactly as told by your health care provider and adjust them as directed. It is normal to feel mild stretching, pulling, tightness, or discomfort as you do these exercises. Stop right away if you feel sudden pain or your pain gets worse. Do not begin these exercises until told by your health care provider. Stretching and range-of-motion exercise This exercise warms up your muscles and joints and improves the movement and flexibility of your knee. The exercise also helps to relieve pain and stiffness. Hamstring, doorway stretch This is an exercise in which you lie in a doorway and prop your leg on a wall to stretch the back of your knee and thigh (hamstring). Lie on your back in front of  a doorway with your left / right leg resting against the wall and your other leg flat on the floor in the doorway. There should be a slight bend in your left / right knee. Straighten your left / right knee. You should feel a stretch behind your knee or thigh. If you do not, scoot your buttocks closer to the door. Hold this position for ____5-15______ seconds. Repeat ____3______ times. Complete this exercise _____2_____ times a day. Strengthening  exercises These exercises build strength and endurance in your knee. Endurance is the ability to use your muscles for a long time, even after they get tired. Quadriceps, isometric This exercise stretches the muscles in front of your thigh (quadriceps) without moving your knee joint (isometric). Lie on your back with your left / right leg extended and your other knee bent. Slowly tense the muscles in the front of your left / right thigh. When you do this, you should see your kneecap slide up toward your hip or see increased dimpling just above the knee. This motion will push the back of your knee toward the floor. If this is painful, try putting a rolled-up hand towel under your knee to support it in a bent position. Change the size of the towel to find a position that allows you to do this exercise without any pain. For ___5-15_______ seconds, hold the muscle as tight as you can without increasing your pain. Relax the muscles slowly and completely. Repeat ____3______ times. Complete this exercise ____2______ times a day. Straight leg raises, flexors This exercise stretches the muscles in front of your thigh (quadriceps) and the muscles that move your hips (hip flexors). Lie on your back with your left / right leg extended and your other knee bent. Tense the muscles in the front of your left / right thigh. When you do this, you should see your kneecap slide up or see increased dimpling just above the knee. Keep these muscles tight as you raise your leg 4-6 inches (10-15 cm) off the floor. Do not let your moving knee bend. Hold this position for __5-15________ seconds. Keep these muscles tense as you slowly lower your leg. Relax your muscles slowly and completely. Repeat ___3_______ times. Complete this exercise ____2______ times a day. Squats This is a weight-bearing exercise in which you bend your knees and lower your hips while engaging your thigh muscles. Stand in front of a table, with your  feet and knees pointing straight ahead. You may rest your hands on the table for balance but not for support. Slowly bend your knees and lower your hips like you are going to sit in a chair. Keep your weight over your heels, not over your toes. Keep your lower legs upright so they are parallel with the table legs. Do not let your hips go lower than your knees. Do not bend lower than told by your health care provider. If your knee pain increases, do not bend as low. Hold the squat position for ___5-15_______ seconds. Slowly push with your legs to return to standing. Do not use your hands to pull yourself to standing. Repeat ____3______ times. Complete this exercise ____1______ times a day. Step-downs This is an exercise in which you step down slowly while engaging your leg muscles. Stand on the edge of a step. Keeping your weight over your left / right heel, slowly bend your left / right knee to bring your left / right heel toward the floor. Lower your heel as far as you can  while keeping control and without increasing any discomfort. Do not let your left / right knee come forward. Use your leg muscles, not gravity, to lower your body. Hold on to a wall or rail for balance if needed. Slowly push through your heel to lift your body weight back up. Return to the starting position. Repeat ____3______ times. Complete this exercise ___2_______ times a day. Straight leg raises, abductors This exercise strengthens the muscles that rotate the leg at the hip and move it away from your body (hip abductors). Lie on your side with your left / right leg in the top position. Lie so your head, shoulder, knee, and hip line up. You may bend your bottom knee to help you keep your balance. Roll your hips slightly forward, so that your hips are stacked directly over each other and your left / right knee is facing forward. Leading with your heel, lift your top leg 4-6 inches (10-15 cm). You should feel the muscles  in your outer hip lifting. Do not let your foot drift forward. Do not let your knee roll toward the ceiling. Hold this position for ____5-15______ seconds. Slowly lower your leg to the starting position. Let your muscles relax completely after each repetition. Repeat ____3______ times. Complete this exercise ____2______ times a day. This information is not intended to replace advice given to you by your health care provider. Make sure you discuss any questions you have with your health care provider. Document Revised: 01/31/2021 Document Reviewed: 01/31/2021 Elsevier Patient Education  2022 [employer] Inc. Patellar Tendinitis Patellar tendinitis is also called jumper's knee or patellar tendinopathy. This condition happens when there is damage to the patellar tendon. Tendons are cord-like tissues that connect muscles to bones. The patellar tendon connects the bottom of the kneecap (patella) to the top of the shin bone (tibia). Patellar tendinitis causes pain in the front of the knee. The condition is classified into the following stages: Stage 1: You have pain only after activity. Stage 2: You have pain during and after activity. Stage 3: You have pain at rest as well as during and after activity. The pain limits your ability to do the activity. Stage 4: You have tendon tears. The tears severely limit your activity. What are the causes? This condition is caused by repeated (repetitive) stress on the tendon. This stress may cause the tendon to stretch, swell, thicken, or tear. What increases the risk? The following factors may make you more likely to develop this condition: Participating in sports that involve running, kicking, and jumping, especially on hard surfaces. These include: Basketball. Volleyball. Soccer. Track and field. Training too hard. Having tight thigh muscles. Having received steroid injections in the tendon. Having had knee surgery. Being 5816-449 years old. Having  rheumatoid arthritis, diabetes, or kidney disease. These conditions interrupt blood flow to the knee, causing the tendon to weaken. What are the signs or symptoms? The main symptom of this condition is pain and swelling in the front of the knee. The pain usually starts slowly and gradually gets worse. It may become painful to straighten your leg. The pain may get worse when you walk, run, or jump. How is this diagnosed? This condition may be diagnosed based on: Your symptoms. Your medical history. A physical exam. During the physical exam, your health care provider may check for: Tenderness along the tendon just below the patella. Tightness in your thigh muscles. Pain when you straighten your knee. Imaging tests, including: X-rays. These will show the position and  condition of your patella. An MRI. This will show any abnormality of the tendon. Ultrasound. This will show any swelling or other abnormalities of the tendon. How is this treated? Treatment for this condition depends on the stage of the condition. It may involve: Avoiding activities that cause pain, such as jumping. Icing and elevating your knee. Having sound wave stimulation to promote healing. Doing physical therapy exercises to improve movement and strength in your knee when pain and swelling improve. Wearing a knee brace. This may be needed if your condition does not improve with treatment. Using crutches or a walker. This may be needed if your condition does not improve with treatment. Surgery. This may be done if you have stage 4 tendinitis. Follow these instructions at home: If you have a removable brace: Wear the brace as told by your health care provider. Remove it only as told by your health care provider. Check the skin around the brace every day. Tell your health care provider about any concerns. Loosen the brace if your toes tingle, become numb, or turn cold and blue. Keep the brace clean. If the brace is not  waterproof: Do not let it get wet. Cover it with a watertight covering when you take a bath or shower. Ask your health care provider when it is safe for you to drive. Managing pain, stiffness, and swelling  If directed, put ice on the injured area. To do this: If you have a removable brace, remove it as told by your health care provider. Put ice in a plastic bag. Place a towel between your skin and the bag. Leave the ice on for 20 minutes, 2-3 times a day. Remove the ice if your skin turns bright red. This is very important. If you cannot feel pain, heat, or cold, you have a greater risk of damage to the area. Move your toes often to reduce stiffness and swelling. Raise (elevate) your knee above the level of your heart while you are sitting or lying down. Activity Do not use the injured limb to support your body weight until your health care provider says that you can. Use your crutches or a walker as told by your health care provider. Do exercises as told by your health care provider or physical therapist. Return to your normal activities as told by your health care provider. Ask your health care provider what activities are safe for you. General instructions Take over-the-counter and prescription medicines only as told by your health care provider. Do not use any products that contain nicotine or tobacco. These products include cigarettes, chewing tobacco, and vaping devices, such as e-cigarettes. These can delay healing. If you need help quitting, ask your health care provider. Keep all follow-up visits. This is important. How is this prevented? Warm up and stretch before being active. Cool down and stretch after being active. Give your body time to rest between periods of activity. You may need to reduce how often you play a sport that requires frequent jumping. Make sure to use equipment that fits you. Be safe and responsible while being active. This will help you avoid falls which  can damage the tendon. Do at least 150 minutes of moderate-intensity exercise each week, such as brisk walking or water aerobics. Maintain physical fitness, including: Strength. Flexibility. Cardiovascular fitness. Endurance. Contact a health care provider if: Your symptoms have not improved in 6 weeks. Your symptoms get worse. Summary Patellar tendinitis is also called jumper's knee or patellar tendinopathy. This condition happens when  there is damage to the patellar tendon. Treatment for this condition depends on the stage of the condition and may include rest, ice, exercises, a knee brace, and surgery. Do not use the injured limb to support your body weight until your health care provider says that you can. Take over-the-counter and prescription medicines only as told by your health care provider. This information is not intended to replace advice given to you by your health care provider. Make sure you discuss any questions you have with your health care provider. Document Revised: 01/31/2021 Document Reviewed: 01/31/2021 Elsevier Patient Education  2022 [employer].

## 2021-08-15 ENCOUNTER — Telehealth: Payer: Self-pay | Admitting: Registered Nurse

## 2021-08-15 DIAGNOSIS — M25561 Pain in right knee: Secondary | ICD-10-CM

## 2021-08-15 DIAGNOSIS — M76891 Other specified enthesopathies of right lower limb, excluding foot: Secondary | ICD-10-CM

## 2021-08-15 NOTE — Telephone Encounter (Signed)
Patient reported taking diclofenac 75mg   DR once a day and helping some but would like to try knee brace.  Patient fitted and distributed medium knee sleeve neoprene from clinic stock today circumference right 16.25".  Patient reported knee felt better after application.  Gait sure and steady in clinic.  Patient to follow up if new or worsening symptoms for re-evaluation and discussed may increase diclofenac DR 75mg  to BID dosing if not getting enough relief with daily dosing.  Patient verbalized understanding information/instructions, agreed with plan of care and had no further questions at this time.

## 2021-08-17 NOTE — Telephone Encounter (Signed)
Patient seen in workcenter stated knee sleeve did help with pain.  Continue plan of care as previously discussed.  Patient verbalized understanding information/instructions, agreed with plan of care and had no further questions at this time.

## 2021-08-24 NOTE — Telephone Encounter (Signed)
Patient seen in workcenter limping today.  Diclofenac and compression knee sleeve helping.  Will follow up if new or worsening symptoms for re-evaluation.

## 2021-09-06 NOTE — Telephone Encounter (Signed)
Patient seen in workcenter wearing neoprene knee sleeve and stated right knee good today but still having pain intermittently.  Made appt with her PCM to follow up.  Encouraged patient to continue wearing knee sleeve for support/alignment of knee joint.  Gait sure and steady; no limping/altered gait today.  Patient verbalized understanding information/instructions, agreed with plan of care and had no further questions at this time. ?

## 2021-09-19 ENCOUNTER — Encounter: Payer: Self-pay | Admitting: Registered Nurse

## 2021-09-19 ENCOUNTER — Telehealth: Payer: Self-pay | Admitting: Registered Nurse

## 2021-09-19 DIAGNOSIS — Z6839 Body mass index (BMI) 39.0-39.9, adult: Secondary | ICD-10-CM

## 2021-09-19 DIAGNOSIS — M76891 Other specified enthesopathies of right lower limb, excluding foot: Secondary | ICD-10-CM

## 2021-09-19 NOTE — Telephone Encounter (Signed)
Patient seen in workcenter.  Knee good today but climbing ladders worsening pain.  Consistent with right tendonitis on 08/10/21 evaluation  She cancelled her follow up appt with PCM as knee wasn't having pain/acting up that week.  Not wearing neoprene sleeve today.  Discussed with patient ladders/stairs increase force on knee joint along with excess weight last known BMI 39  If knowing she will be doing a lot of stairs/ladder work encouraged her to wear neoprene knee sleeve for support.  Discussed [facility] dietitian available no copays required x 6 visits.  Patient asked me to send her information.  Consider appt with [employer] dietitian -free x 6 visits ?Participants Receive  Group Accountability Sessions  1:1 Wellness Counseling Sessions  Bluetooth Body Composition Scale - tracks body fat, hydration, muscle mass & more!  Customized Nutrition Plan  Customized Fitness Plan  Customized Self-Care Strategies  Access to Nutrition & Fitness Tracking App  Goal Tracking and Reminders Program Cost [facility] Members receive up to 6 individual Wellness Counseling Sessions at no cost - Group sessions are offered at no cost if member attends individual sessions Interested in signing up? Follow this link: [email or web address] Want to learn more? Email [email or web address] to find out how this proven outcome program can work for you  Patient gait sure and steady in warehouse, no limp, A&Ox3 spoke full sentences without difficulty  Patient verbalized understanding information/instructions, agreed with plan fo care and had no further questions at this time. ?

## 2021-10-27 ENCOUNTER — Ambulatory Visit: Payer: Self-pay | Admitting: *Deleted

## 2021-10-27 VITALS — BP 132/82 | Ht 65.0 in | Wt 231.0 lb

## 2021-10-27 DIAGNOSIS — Z0000 Encounter for general adult medical examination without abnormal findings: Secondary | ICD-10-CM

## 2021-10-27 NOTE — Progress Notes (Signed)
[employer] insurance premium discount evaluation: ?Labs Drawn in January. ?Replacements ROI form signed. ?Tobacco Free Attestation form signed.  ?Forms placed in paper chart. ? ?

## 2021-11-17 ENCOUNTER — Other Ambulatory Visit: Payer: Self-pay | Admitting: *Deleted

## 2021-11-17 DIAGNOSIS — E790 Hyperuricemia without signs of inflammatory arthritis and tophaceous disease: Secondary | ICD-10-CM

## 2021-11-17 DIAGNOSIS — R7401 Elevation of levels of liver transaminase levels: Secondary | ICD-10-CM

## 2021-11-17 DIAGNOSIS — E1169 Type 2 diabetes mellitus with other specified complication: Secondary | ICD-10-CM

## 2021-11-21 ENCOUNTER — Other Ambulatory Visit: Payer: Self-pay | Admitting: Registered Nurse

## 2021-11-21 DIAGNOSIS — R7401 Elevation of levels of liver transaminase levels: Secondary | ICD-10-CM

## 2021-11-21 DIAGNOSIS — E1169 Type 2 diabetes mellitus with other specified complication: Secondary | ICD-10-CM

## 2021-11-21 LAB — HEPATIC FUNCTION PANEL
ALT: 38 IU/L — ABNORMAL HIGH (ref 0–32)
AST: 23 IU/L (ref 0–40)
Albumin: 4.5 g/dL (ref 3.8–4.9)
Alkaline Phosphatase: 97 IU/L (ref 44–121)
Bilirubin Total: 0.3 mg/dL (ref 0.0–1.2)
Bilirubin, Direct: <0.1 mg/dL (ref 0.00–0.40)
Total Protein: 6.9 g/dL (ref 6.0–8.5)

## 2021-11-21 LAB — HGB A1C W/O EAG: Hgb A1c MFr Bld: 7.2 % — ABNORMAL HIGH (ref 4.8–5.6)

## 2021-11-21 LAB — URIC ACID: Uric Acid: 7 mg/dL (ref 3.0–7.2)

## 2021-11-22 LAB — HEPATIC FUNCTION PANEL
ALT: 35 IU/L — ABNORMAL HIGH (ref 0–32)
AST: 19 IU/L (ref 0–40)
Albumin: 4.5 g/dL (ref 3.8–4.9)
Alkaline Phosphatase: 90 IU/L (ref 44–121)
Bilirubin Total: 0.3 mg/dL (ref 0.0–1.2)
Bilirubin, Direct: <0.1 mg/dL (ref 0.00–0.40)
Total Protein: 6.8 g/dL (ref 6.0–8.5)

## 2021-11-22 LAB — HGB A1C W/O EAG: Hgb A1c MFr Bld: 7.2 % — ABNORMAL HIGH (ref 4.8–5.6)

## 2021-11-24 ENCOUNTER — Encounter: Payer: Self-pay | Admitting: Registered Nurse

## 2021-11-24 NOTE — Addendum Note (Signed)
Addended by: Albina Billet A on: 11/24/2021 11:40 PM   Modules accepted: Orders, Level of Service

## 2021-11-27 NOTE — Progress Notes (Signed)
Noted f/u labs scheduled Aug 2023

## 2021-11-27 NOTE — Progress Notes (Signed)
Appt made for Aug 2023 for repeat LFTs and A1c.

## 2021-12-05 NOTE — Progress Notes (Signed)
Per coworker, pt on vacation rest of this week. Will f/u next week when back at work.

## 2021-12-05 NOTE — Progress Notes (Signed)
Case discussed with RN Rolly Salter follow up with patient in 1 week message left yesterday for patient

## 2022-03-03 ENCOUNTER — Telehealth: Payer: Self-pay | Admitting: Registered Nurse

## 2022-03-03 DIAGNOSIS — Z6839 Body mass index (BMI) 39.0-39.9, adult: Secondary | ICD-10-CM

## 2022-03-03 DIAGNOSIS — R7401 Elevation of levels of liver transaminase levels: Secondary | ICD-10-CM

## 2022-03-03 DIAGNOSIS — I10 Essential (primary) hypertension: Secondary | ICD-10-CM

## 2022-03-03 DIAGNOSIS — E119 Type 2 diabetes mellitus without complications: Secondary | ICD-10-CM

## 2022-03-05 ENCOUNTER — Other Ambulatory Visit: Payer: No Typology Code available for payment source

## 2022-03-05 DIAGNOSIS — E1169 Type 2 diabetes mellitus with other specified complication: Secondary | ICD-10-CM

## 2022-03-05 DIAGNOSIS — R7401 Elevation of levels of liver transaminase levels: Secondary | ICD-10-CM

## 2022-03-05 IMAGING — MG MM DIGITAL SCREENING BILAT W/ TOMO AND CAD
8 series · 8 of 24 positions shown · non-contrast
Comparison: Previous exam(s).

CLINICAL DATA: Screening.

EXAM:
DIGITAL SCREENING BILATERAL MAMMOGRAM WITH TOMOSYNTHESIS AND CAD
TECHNIQUE: Bilateral screening digital craniocaudal and mediolateral oblique
mammograms were obtained. Bilateral screening digital breast
tomosynthesis was performed. The images were evaluated with
computer-aided detection.

[L CC synth-2D]
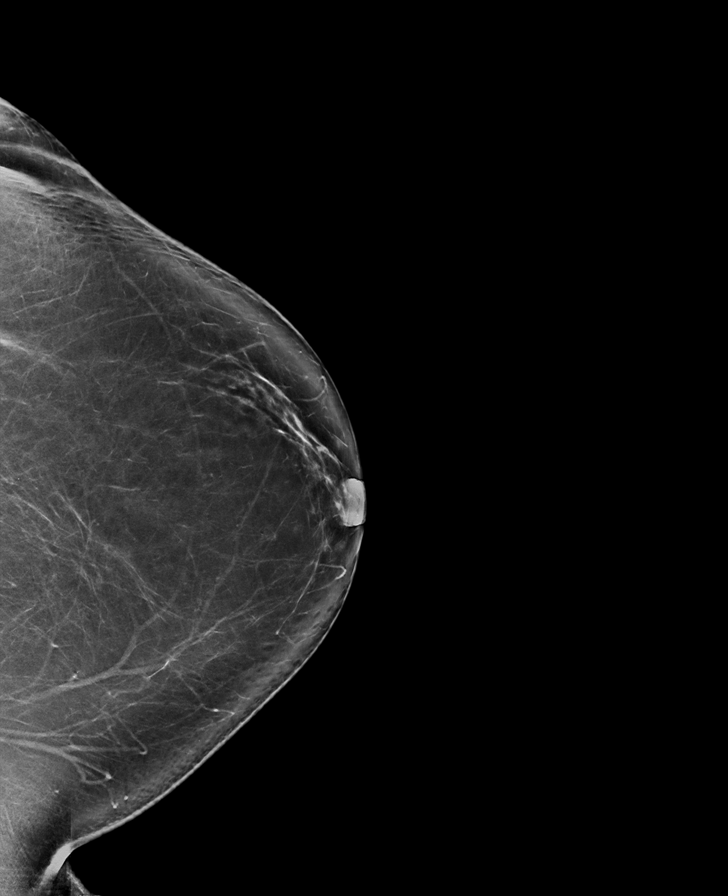

[L MLO synth-2D]
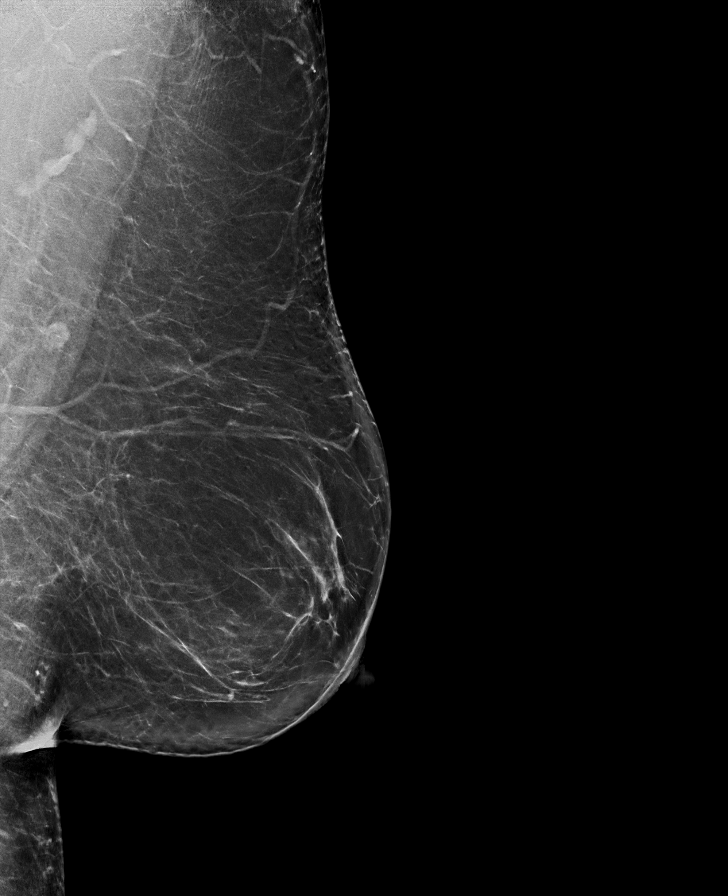

[R MLO synth-2D]
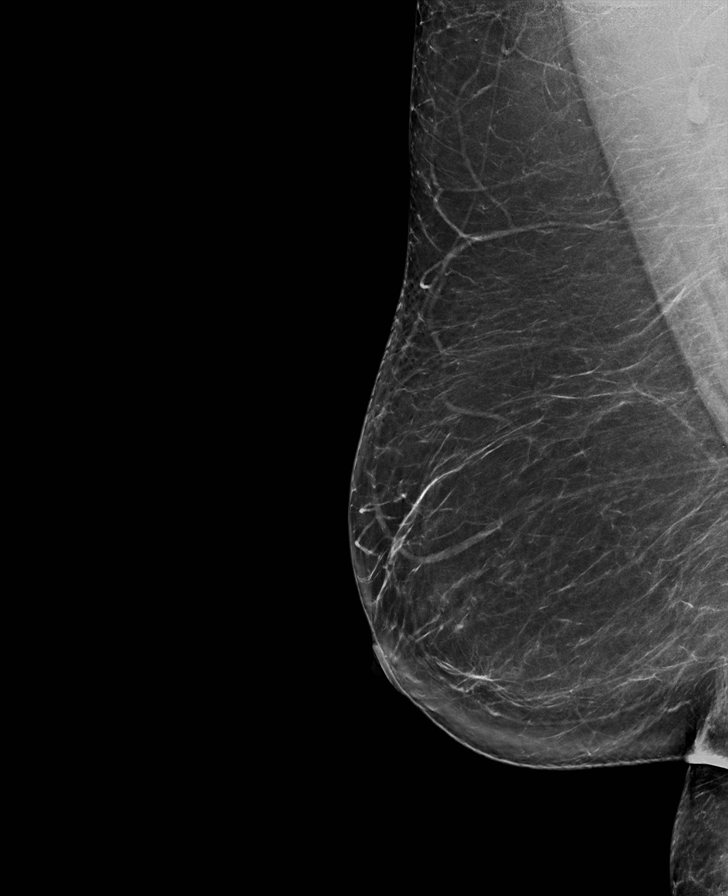

[R CC synth-2D]
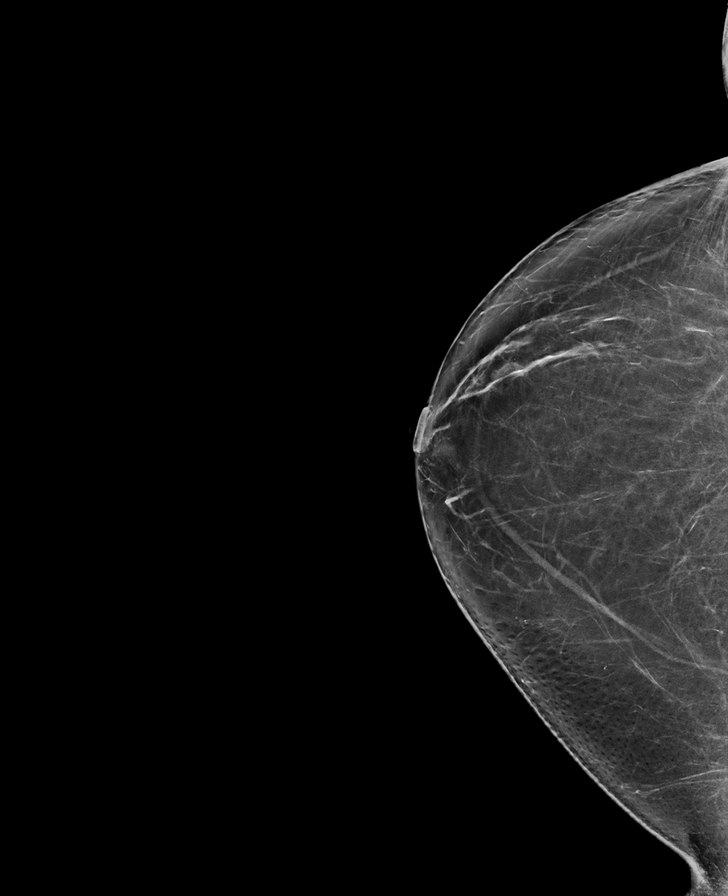

[L MLO tomo · tomo slice 45/89.0]
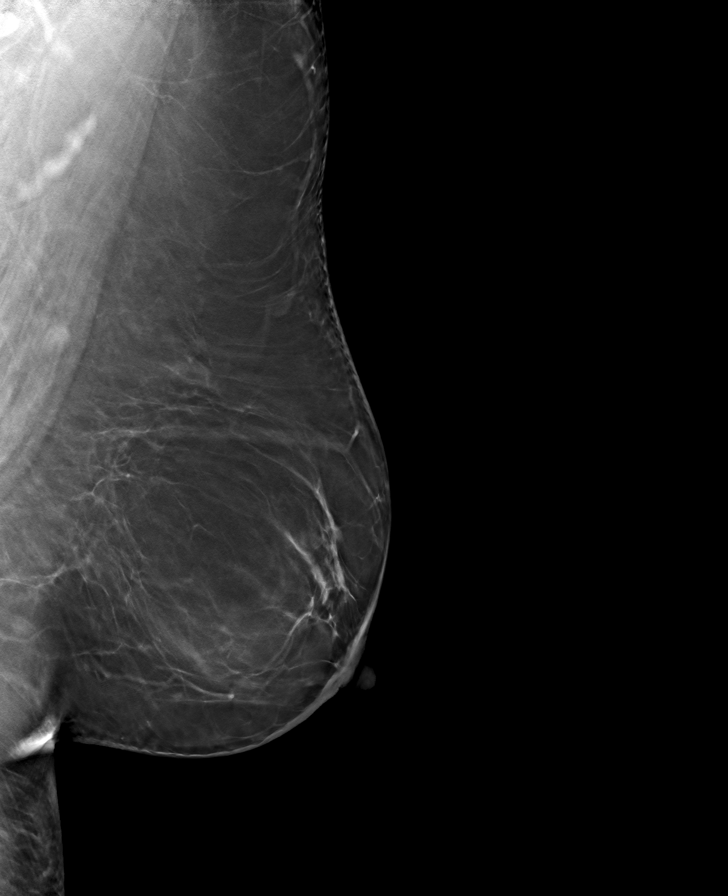

[L CC tomo · tomo slice 46/91.0]
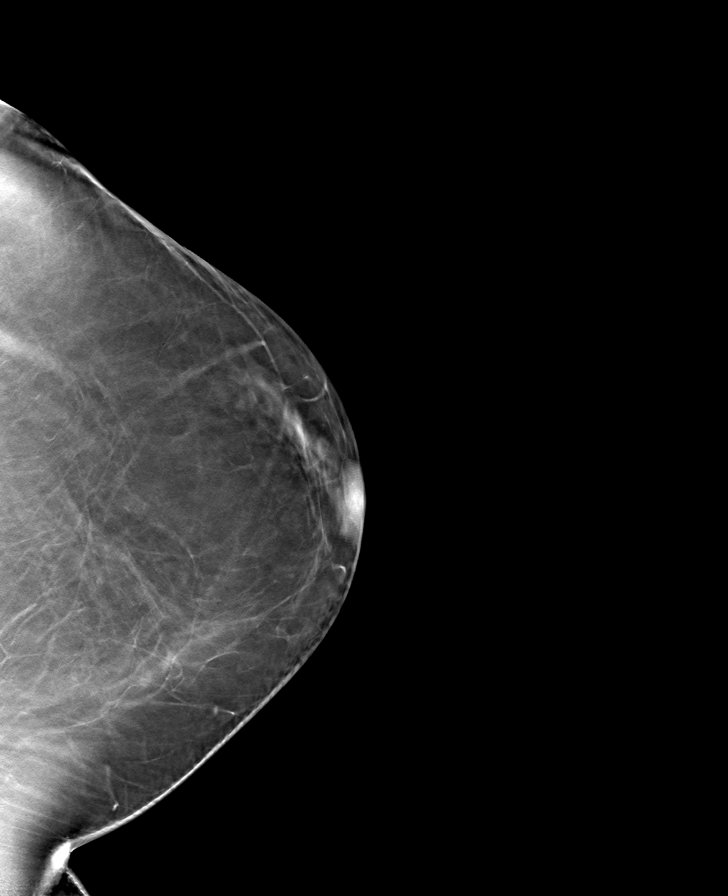

[R MLO tomo · tomo slice 43/86.0]
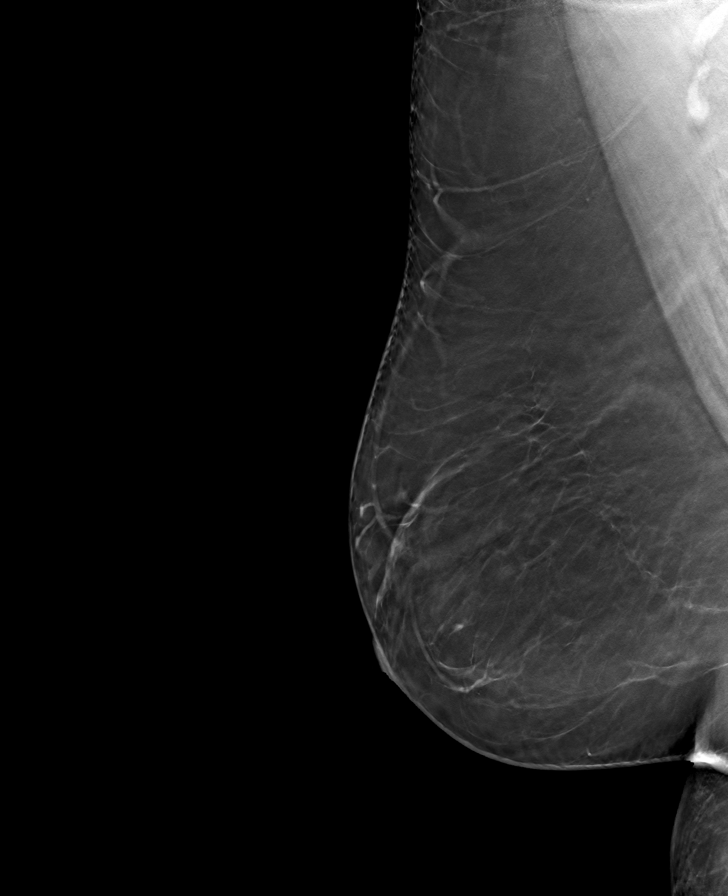

[R CC tomo · tomo slice 42/83.0]
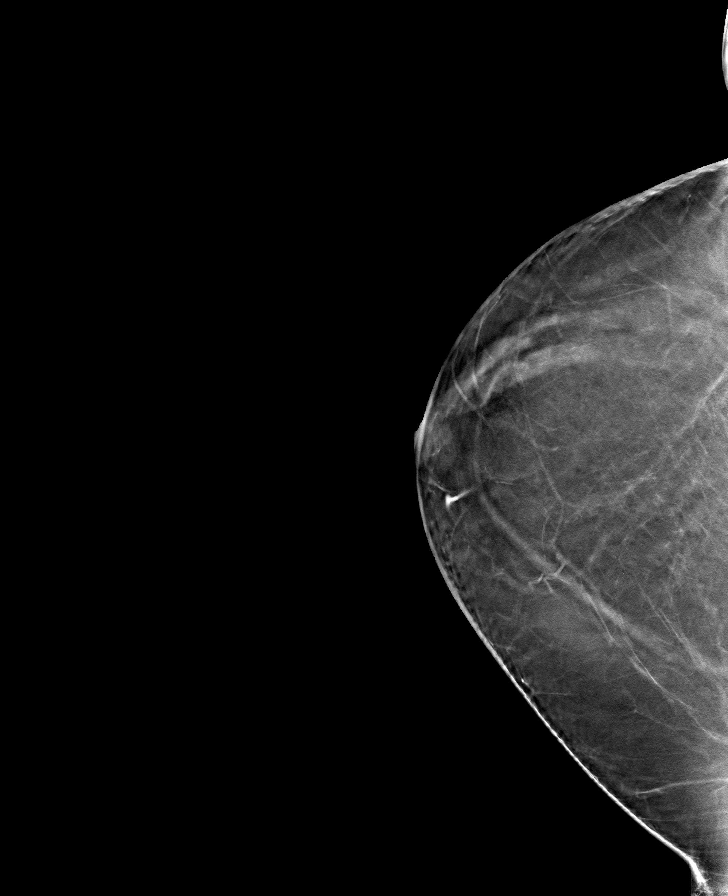

[8 of 24 positions shown; findings below may reference images not displayed]

ACR Breast Density Category b: There are scattered areas of
fibroglandular density.
FINDINGS: There are no findings suspicious for malignancy.
IMPRESSION: No mammographic evidence of malignancy. A result letter of this
screening mammogram will be mailed directly to the patient.

RECOMMENDATION:
Screening mammogram in one year. (Code:51-O-LD2)

BI-RADS CATEGORY  1: Negative.

## 2022-03-05 NOTE — Telephone Encounter (Signed)
Notified by RN Larina Bras patient reported to clinic for Hgba1c and LFT blood draw today.

## 2022-03-05 NOTE — Telephone Encounter (Signed)
Patient left message had labs recently drawn at [facility].  Checked Epic and [facility] portal unable to see any recent results.  Asked patient to bring copy to clinic for review as she was asking if those would be sufficient but unsure what labs were completed at this time.

## 2022-03-05 NOTE — Progress Notes (Signed)
Labs drawn in right arm with 23g butterfly without difficulty.

## 2022-03-06 LAB — HEPATIC FUNCTION PANEL
ALT: 35 IU/L — ABNORMAL HIGH (ref 0–32)
AST: 18 IU/L (ref 0–40)
Albumin: 4.5 g/dL (ref 3.8–4.9)
Alkaline Phosphatase: 82 IU/L (ref 44–121)
Bilirubin Total: 0.3 mg/dL (ref 0.0–1.2)
Bilirubin, Direct: 0.12 mg/dL (ref 0.00–0.40)
Total Protein: 6.7 g/dL (ref 6.0–8.5)

## 2022-03-06 LAB — HEMOGLOBIN A1C
Est. average glucose Bld gHb Est-mCnc: 137 mg/dL
Hgb A1c MFr Bld: 6.4 % — ABNORMAL HIGH (ref 4.8–5.6)

## 2022-03-07 NOTE — Telephone Encounter (Signed)
Patient seen in workcenter. Patient notified Hgba1c improved from 7.2 to 6.4 and ALT stabe 35 still elevated.  All other liver enzymes normal.  Patient to follow up with PCM as scheduled/take medication as prescribed.  Recommend reepat LFTs with Be Well 2024 labs and next Hgba1c in 3 months.  Pateint stated she has lost 20 lbs in the past 3 months.  Trulicity was discontinued and ozempic started and has been working well for her.  Feeling better. Patient verbalized understanding information/instructions, agreed with plan of care and had no further questions at this time.

## 2022-04-10 ENCOUNTER — Ambulatory Visit: Payer: No Typology Code available for payment source

## 2022-04-10 DIAGNOSIS — Z23 Encounter for immunization: Secondary | ICD-10-CM

## 2022-06-18 ENCOUNTER — Other Ambulatory Visit: Payer: Self-pay | Admitting: Occupational Medicine

## 2022-06-18 ENCOUNTER — Telehealth: Payer: Self-pay | Admitting: Registered Nurse

## 2022-06-18 ENCOUNTER — Encounter: Payer: Self-pay | Admitting: Registered Nurse

## 2022-06-18 DIAGNOSIS — K219 Gastro-esophageal reflux disease without esophagitis: Secondary | ICD-10-CM

## 2022-06-18 DIAGNOSIS — Z23 Encounter for immunization: Secondary | ICD-10-CM

## 2022-06-18 DIAGNOSIS — Z0000 Encounter for general adult medical examination without abnormal findings: Secondary | ICD-10-CM

## 2022-06-18 DIAGNOSIS — E119 Type 2 diabetes mellitus without complications: Secondary | ICD-10-CM

## 2022-06-18 DIAGNOSIS — Z8639 Personal history of other endocrine, nutritional and metabolic disease: Secondary | ICD-10-CM

## 2022-06-18 NOTE — Telephone Encounter (Signed)
Per epic last labs Aug 2023.  Annual physical with PCM and Be Well 2025 appt due Jan 2024.  Takes omeprazole and history of vitamin D deficiency 2 years ago.  Female exec panel, A1c, Vitamin D and Hgba1c ordered fasting for patient and RN Kimrey notified.  [facility] appt 21 Jun 2022.  Epic reviewed.

## 2022-06-18 NOTE — Telephone Encounter (Signed)
Patient also asked if she can have shingrix vaccine in our clinic.  Epic reviewed last vaccination Oct 2023 flu.  No medication or allergy contraindications.   Patient has not received shingrix in the past per Epic  age 56y/o.

## 2022-06-19 ENCOUNTER — Other Ambulatory Visit: Payer: No Typology Code available for payment source | Admitting: Occupational Medicine

## 2022-06-19 DIAGNOSIS — K219 Gastro-esophageal reflux disease without esophagitis: Secondary | ICD-10-CM

## 2022-06-19 DIAGNOSIS — Z0000 Encounter for general adult medical examination without abnormal findings: Secondary | ICD-10-CM

## 2022-06-19 DIAGNOSIS — E119 Type 2 diabetes mellitus without complications: Secondary | ICD-10-CM

## 2022-06-19 DIAGNOSIS — Z8639 Personal history of other endocrine, nutritional and metabolic disease: Secondary | ICD-10-CM

## 2022-06-19 NOTE — Progress Notes (Signed)
Lab drawn from right AC tolerated well no issues noted.   

## 2022-06-20 LAB — CMP12+LP+TP+TSH+6AC+CBC/D/PLT
ALT: 31 IU/L (ref 0–32)
AST: 17 IU/L (ref 0–40)
Albumin/Globulin Ratio: 2 (ref 1.2–2.2)
Albumin: 4.4 g/dL (ref 3.8–4.9)
Alkaline Phosphatase: 78 IU/L (ref 44–121)
BUN/Creatinine Ratio: 22 (ref 9–23)
BUN: 19 mg/dL (ref 6–24)
Basophils Absolute: 0 10*3/uL (ref 0.0–0.2)
Basos: 1 %
Bilirubin Total: 0.3 mg/dL (ref 0.0–1.2)
Calcium: 9.6 mg/dL (ref 8.7–10.2)
Chloride: 99 mmol/L (ref 96–106)
Chol/HDL Ratio: 2.5 ratio (ref 0.0–4.4)
Cholesterol, Total: 149 mg/dL (ref 100–199)
Creatinine, Ser: 0.85 mg/dL (ref 0.57–1.00)
EOS (ABSOLUTE): 0.2 10*3/uL (ref 0.0–0.4)
Eos: 3 %
Estimated CHD Risk: <0.5 times avg. (ref 0.0–1.0)
Free Thyroxine Index: 2.5 (ref 1.2–4.9)
GGT: 35 IU/L (ref 0–60)
Globulin, Total: 2.2 g/dL (ref 1.5–4.5)
Glucose: 99 mg/dL (ref 70–99)
HDL: 60 mg/dL (ref >39)
Hematocrit: 45.8 % (ref 34.0–46.6)
Hemoglobin: 15.2 g/dL (ref 11.1–15.9)
Immature Grans (Abs): 0 10*3/uL (ref 0.0–0.1)
Immature Granulocytes: 0 %
Iron: 64 ug/dL (ref 27–159)
LDH: 142 IU/L (ref 119–226)
LDL Chol Calc (NIH): 72 mg/dL (ref 0–99)
Lymphocytes Absolute: 1.7 10*3/uL (ref 0.7–3.1)
Lymphs: 26 %
MCH: 28.3 pg (ref 26.6–33.0)
MCHC: 33.2 g/dL (ref 31.5–35.7)
MCV: 85 fL (ref 79–97)
Monocytes Absolute: 0.4 10*3/uL (ref 0.1–0.9)
Monocytes: 6 %
Neutrophils Absolute: 4.3 10*3/uL (ref 1.4–7.0)
Neutrophils: 64 %
Phosphorus: 4.5 mg/dL — ABNORMAL HIGH (ref 3.0–4.3)
Platelets: 412 10*3/uL (ref 150–450)
Potassium: 4.2 mmol/L (ref 3.5–5.2)
RBC: 5.37 x10E6/uL — ABNORMAL HIGH (ref 3.77–5.28)
RDW: 12.7 % (ref 11.7–15.4)
Sodium: 138 mmol/L (ref 134–144)
T3 Uptake Ratio: 28 % (ref 24–39)
T4, Total: 8.9 ug/dL (ref 4.5–12.0)
TSH: 0.932 u[IU]/mL (ref 0.450–4.500)
Total Protein: 6.6 g/dL (ref 6.0–8.5)
Triglycerides: 94 mg/dL (ref 0–149)
Uric Acid: 7.1 mg/dL (ref 3.0–7.2)
VLDL Cholesterol Cal: 17 mg/dL (ref 5–40)
WBC: 6.7 10*3/uL (ref 3.4–10.8)
eGFR: 80 mL/min/{1.73_m2} (ref >59)

## 2022-06-20 LAB — VITAMIN D 25 HYDROXY (VIT D DEFICIENCY, FRACTURES): Vit D, 25-Hydroxy: 47 ng/mL (ref 30.0–100.0)

## 2022-06-20 LAB — HEMOGLOBIN A1C
Est. average glucose Bld gHb Est-mCnc: 126 mg/dL
Hgb A1c MFr Bld: 6 % — ABNORMAL HIGH (ref 4.8–5.6)

## 2022-06-20 LAB — MAGNESIUM: Magnesium: 1.9 mg/dL (ref 1.6–2.3)

## 2022-06-21 ENCOUNTER — Ambulatory Visit: Payer: No Typology Code available for payment source | Admitting: Occupational Medicine

## 2022-06-21 DIAGNOSIS — Z23 Encounter for immunization: Secondary | ICD-10-CM

## 2022-06-27 ENCOUNTER — Other Ambulatory Visit (HOSPITAL_COMMUNITY): Payer: Self-pay

## 2022-07-04 ENCOUNTER — Other Ambulatory Visit (HOSPITAL_COMMUNITY): Payer: Self-pay

## 2022-07-04 ENCOUNTER — Other Ambulatory Visit: Payer: Self-pay

## 2022-07-04 MED ORDER — OZEMPIC (1 MG/DOSE) 4 MG/3ML ~~LOC~~ SOPN
1.0000 mg | PEN_INJECTOR | SUBCUTANEOUS | 1 refills | Status: AC
  Filled 2022-07-04: qty 9, 84d supply, fill #0
  Filled 2022-07-30: qty 3, 28d supply, fill #0
  Filled 2022-11-12: qty 3, 28d supply, fill #1
  Filled 2022-12-11: qty 3, 28d supply, fill #2
  Filled 2023-03-04: qty 3, 28d supply, fill #0
  Filled 2023-04-02: qty 3, 28d supply, fill #1
  Filled 2023-04-30: qty 3, 28d supply, fill #2

## 2022-07-04 MED ORDER — METFORMIN HCL 1000 MG PO TABS
1000.0000 mg | ORAL_TABLET | Freq: Every day | ORAL | 1 refills | Status: DC
  Filled 2022-07-04 (×2): qty 90, 90d supply, fill #0

## 2022-07-04 MED ORDER — ATORVASTATIN CALCIUM 40 MG PO TABS
40.0000 mg | ORAL_TABLET | Freq: Every day | ORAL | 1 refills | Status: AC
  Filled 2022-07-04 (×2): qty 90, 90d supply, fill #0

## 2022-07-04 MED ORDER — LISINOPRIL 10 MG PO TABS
10.0000 mg | ORAL_TABLET | Freq: Every day | ORAL | 1 refills | Status: DC
  Filled 2022-07-04 (×2): qty 90, 90d supply, fill #0

## 2022-07-04 MED ORDER — HYDROCHLOROTHIAZIDE 25 MG PO TABS
25.0000 mg | ORAL_TABLET | Freq: Every morning | ORAL | 1 refills | Status: AC
  Filled 2022-07-04 (×2): qty 90, 90d supply, fill #0

## 2022-07-04 MED ORDER — OMEPRAZOLE 40 MG PO CPDR
40.0000 mg | DELAYED_RELEASE_CAPSULE | Freq: Every day | ORAL | 1 refills | Status: AC
  Filled 2022-07-04 (×2): qty 90, 90d supply, fill #0

## 2022-07-04 MED ORDER — EZETIMIBE 10 MG PO TABS
10.0000 mg | ORAL_TABLET | Freq: Every day | ORAL | 1 refills | Status: DC
  Filled 2022-07-04 (×2): qty 90, 90d supply, fill #0

## 2022-07-05 ENCOUNTER — Other Ambulatory Visit (HOSPITAL_COMMUNITY): Payer: Self-pay

## 2022-07-30 ENCOUNTER — Other Ambulatory Visit (HOSPITAL_COMMUNITY): Payer: Self-pay

## 2022-07-31 ENCOUNTER — Other Ambulatory Visit (HOSPITAL_COMMUNITY): Payer: Self-pay

## 2022-08-13 ENCOUNTER — Other Ambulatory Visit: Payer: Self-pay

## 2022-08-13 MED ORDER — OZEMPIC (2 MG/DOSE) 8 MG/3ML ~~LOC~~ SOPN
2.0000 mg | PEN_INJECTOR | SUBCUTANEOUS | 3 refills | Status: AC
  Filled 2022-08-13: qty 3, 28d supply, fill #0
  Filled 2022-09-11: qty 3, 28d supply, fill #1
  Filled 2022-10-09: qty 3, 28d supply, fill #2
  Filled 2022-11-07: qty 3, 28d supply, fill #3

## 2022-08-23 ENCOUNTER — Ambulatory Visit: Payer: No Typology Code available for payment source | Admitting: Occupational Medicine

## 2022-08-23 DIAGNOSIS — Z23 Encounter for immunization: Secondary | ICD-10-CM

## 2022-08-23 NOTE — Progress Notes (Signed)
Shingles vaccine given tolerated well. Completed series.

## 2022-09-11 ENCOUNTER — Other Ambulatory Visit (HOSPITAL_COMMUNITY): Payer: Self-pay

## 2022-09-18 ENCOUNTER — Encounter: Payer: Self-pay | Admitting: Registered Nurse

## 2022-09-18 ENCOUNTER — Telehealth: Payer: Self-pay | Admitting: Registered Nurse

## 2022-09-18 DIAGNOSIS — E7800 Pure hypercholesterolemia, unspecified: Secondary | ICD-10-CM

## 2022-09-18 DIAGNOSIS — I10 Essential (primary) hypertension: Secondary | ICD-10-CM

## 2022-09-18 DIAGNOSIS — E1169 Type 2 diabetes mellitus with other specified complication: Secondary | ICD-10-CM

## 2022-09-18 DIAGNOSIS — K219 Gastro-esophageal reflux disease without esophagitis: Secondary | ICD-10-CM

## 2022-09-18 MED ORDER — OMEPRAZOLE 40 MG PO CPDR: 40.0000 mg | DELAYED_RELEASE_CAPSULE | Freq: Every day | ORAL | 2 refills | Status: DC

## 2022-09-18 MED ORDER — HYDROCHLOROTHIAZIDE 25 MG PO TABS: 25.0000 mg | ORAL_TABLET | Freq: Every day | ORAL | 2 refills | Status: DC

## 2022-09-18 MED ORDER — METFORMIN HCL 1000 MG PO TABS: 1000.0000 mg | ORAL_TABLET | Freq: Every day | ORAL | 2 refills | Status: DC

## 2022-09-18 MED ORDER — METFORMIN HCL 500 MG PO TABS: 500.0000 mg | ORAL_TABLET | Freq: Every day | ORAL | 2 refills | Status: DC

## 2022-09-18 MED ORDER — ATORVASTATIN CALCIUM 40 MG PO TABS: 40.0000 mg | ORAL_TABLET | Freq: Every day | ORAL | 2 refills | Status: DC

## 2022-09-18 MED ORDER — LISINOPRIL 40 MG PO TABS: 40.0000 mg | ORAL_TABLET | Freq: Every day | ORAL | 2 refills | Status: DC

## 2022-09-18 NOTE — Telephone Encounter (Signed)
Patient seen in workcenter notified lab order entered and given PDRx dispense.  She stated has lost weight and had medication changes and does want to recheck her Hgba1c and will schedule with RN Kimrey.

## 2022-09-18 NOTE — Telephone Encounter (Signed)
Patient last filled at [facility] needs refill PDRx now.  Last PDRx fill 05/03/22 patient reported PCM changed metformin to '1000mg'$  po qam and '500mg'$  po qpm now.  Denied changes to lisinopril '10mg'$  po daily. Hydrochlorothiazide '25mg'$  po daily, omeprazole DR '40mg'$  po daily; atorvastatin '40mg'$  po daily.  Dispensed 90 day supply each medication from [facility] today.  Next labs Hgba1c due this month and all others Dec 2024.  Last labs Dec 2023 Vitamin D 47  Latest Reference Range & Units 03/05/22 10:45 06/19/22 08:40  Sodium 134 - 144 mmol/L  138  Potassium 3.5 - 5.2 mmol/L  4.2  Chloride 96 - 106 mmol/L  99  Glucose 70 - 99 mg/dL  99  BUN 6 - 24 mg/dL  19  Creatinine 0.57 - 1.00 mg/dL  0.85  Calcium 8.7 - 10.2 mg/dL  9.6  BUN/Creatinine Ratio 9 - 23   22  eGFR >59 mL/min/1.73  80  Phosphorus 3.0 - 4.3 mg/dL  4.5 (H)  Magnesium 1.6 - 2.3 mg/dL  1.9  Alkaline Phosphatase 44 - 121 IU/L 82 78  Albumin 3.8 - 4.9 g/dL 4.5 4.4  Albumin/Globulin Ratio 1.2 - 2.2   2.0  Uric Acid 3.0 - 7.2 mg/dL  7.1  AST 0 - 40 IU/L 18 17  ALT 0 - 32 IU/L 35 (H) 31  Total Protein 6.0 - 8.5 g/dL 6.7 6.6  Total Bilirubin 0.0 - 1.2 mg/dL 0.3 0.3  GGT 0 - 60 IU/L  35  BILIRUBIN, DIRECT 0.00 - 0.40 mg/dL 0.12   Estimated CHD Risk 0.0 - 1.0 times avg.   < 0.5  LDH 119 - 226 IU/L  142  Total CHOL/HDL Ratio 0.0 - 4.4 ratio  2.5  Cholesterol, Total 100 - 199 mg/dL  149  HDL Cholesterol >39 mg/dL  60  Triglycerides 0 - 149 mg/dL  94  VLDL Cholesterol Cal 5 - 40 mg/dL  17  LDL Chol Calc (NIH) 0 - 99 mg/dL  72  Iron 27 - 159 ug/dL  64  Vitamin D, 25-Hydroxy 30.0 - 100.0 ng/mL  47.0  Globulin, Total 1.5 - 4.5 g/dL  2.2  WBC 3.4 - 10.8 x10E3/uL  6.7  RBC 3.77 - 5.28 x10E6/uL  5.37 (H)  Hemoglobin 11.1 - 15.9 g/dL  15.2  HCT 34.0 - 46.6 %  45.8  MCV 79 - 97 fL  85  MCH 26.6 - 33.0 pg  28.3  MCHC 31.5 - 35.7 g/dL  33.2  RDW 11.7 - 15.4 %  12.7  Platelets 150 - 450 x10E3/uL  412  Neutrophils Not Estab. %  64  Immature  Granulocytes Not Estab. %  0  NEUT# 1.4 - 7.0 x10E3/uL  4.3  Lymphocyte # 0.7 - 3.1 x10E3/uL  1.7  Monocytes Absolute 0.1 - 0.9 x10E3/uL  0.4  Basophils Absolute 0.0 - 0.2 x10E3/uL  0.0  Immature Grans (Abs) 0.0 - 0.1 x10E3/uL  0.0  Lymphs Not Estab. %  26  Monocytes Not Estab. %  6  Basos Not Estab. %  1  Eos Not Estab. %  3  EOS (ABSOLUTE) 0.0 - 0.4 x10E3/uL  0.2  Hemoglobin A1C 4.8 - 5.6 % 6.4 (H) 6.0 (H)  Est. average glucose Bld gHb Est-mCnc mg/dL 137 126  TSH 0.450 - 4.500 uIU/mL  0.932  Thyroxine (T4) 4.5 - 12.0 ug/dL  8.9  Free Thyroxine Index 1.2 - 4.9   2.5  T3 Uptake Ratio 24 - 39 %  28  (H): Data  is abnormally high

## 2022-09-19 NOTE — Progress Notes (Signed)
noted 

## 2022-09-27 ENCOUNTER — Other Ambulatory Visit: Payer: No Typology Code available for payment source | Admitting: Occupational Medicine

## 2022-09-27 DIAGNOSIS — E1169 Type 2 diabetes mellitus with other specified complication: Secondary | ICD-10-CM

## 2022-09-27 DIAGNOSIS — K219 Gastro-esophageal reflux disease without esophagitis: Secondary | ICD-10-CM

## 2022-09-27 DIAGNOSIS — E119 Type 2 diabetes mellitus without complications: Secondary | ICD-10-CM

## 2022-09-27 LAB — GLUCOSE, POCT (MANUAL RESULT ENTRY): POC Glucose: 104 mg/dl — AB (ref 70–99)

## 2022-09-27 NOTE — Addendum Note (Signed)
Addended by: Maudry Mayhew M on: 09/27/2022 12:20 PM   Modules accepted: Orders

## 2022-09-27 NOTE — Progress Notes (Signed)
Felt lightheaded some better after eating 25 mins ago wanted to check BP. BP good. BS checked 104 after eating 25 mins ago. Encourage if happens again to check BS before eating.

## 2022-09-27 NOTE — Progress Notes (Signed)
Lab drawn from right AC tolerated well no issues noted.  

## 2022-09-28 ENCOUNTER — Other Ambulatory Visit: Payer: Self-pay | Admitting: Registered Nurse

## 2022-09-28 DIAGNOSIS — Z0000 Encounter for general adult medical examination without abnormal findings: Secondary | ICD-10-CM

## 2022-09-28 DIAGNOSIS — E1169 Type 2 diabetes mellitus with other specified complication: Secondary | ICD-10-CM

## 2022-09-28 LAB — CBC WITH DIFFERENTIAL/PLATELET
Basophils Absolute: 0 10*3/uL (ref 0.0–0.2)
Basos: 0 %
EOS (ABSOLUTE): 0.2 10*3/uL (ref 0.0–0.4)
Eos: 3 %
Hematocrit: 47.1 % — ABNORMAL HIGH (ref 34.0–46.6)
Hemoglobin: 15.2 g/dL (ref 11.1–15.9)
Immature Grans (Abs): 0 10*3/uL (ref 0.0–0.1)
Immature Granulocytes: 0 %
Lymphocytes Absolute: 1.6 10*3/uL (ref 0.7–3.1)
Lymphs: 26 %
MCH: 28.7 pg (ref 26.6–33.0)
MCHC: 32.3 g/dL (ref 31.5–35.7)
MCV: 89 fL (ref 79–97)
Monocytes Absolute: 0.5 10*3/uL (ref 0.1–0.9)
Monocytes: 7 %
Neutrophils Absolute: 4 10*3/uL (ref 1.4–7.0)
Neutrophils: 64 %
Platelets: 369 10*3/uL (ref 150–450)
RBC: 5.29 x10E6/uL — ABNORMAL HIGH (ref 3.77–5.28)
RDW: 12.7 % (ref 11.7–15.4)
WBC: 6.3 10*3/uL (ref 3.4–10.8)

## 2022-09-28 LAB — HEMOGLOBIN A1C
Est. average glucose Bld gHb Est-mCnc: 126 mg/dL
Hgb A1c MFr Bld: 6 % — ABNORMAL HIGH (ref 4.8–5.6)

## 2022-10-01 ENCOUNTER — Telehealth: Payer: Self-pay | Admitting: Registered Nurse

## 2022-10-01 ENCOUNTER — Encounter: Payer: Self-pay | Admitting: Registered Nurse

## 2022-10-01 ENCOUNTER — Ambulatory Visit: Payer: No Typology Code available for payment source | Admitting: Occupational Medicine

## 2022-10-01 VITALS — Temp 98.4°F

## 2022-10-01 DIAGNOSIS — M109 Gout, unspecified: Secondary | ICD-10-CM

## 2022-10-01 DIAGNOSIS — M79676 Pain in unspecified toe(s): Secondary | ICD-10-CM

## 2022-10-01 MED ORDER — COLCHICINE 0.6 MG PO TABS: ORAL_TABLET | ORAL | 0 refills | Status: DC

## 2022-10-01 MED ORDER — PREDNISONE 10 MG PO TABS: 40.0000 mg | ORAL_TABLET | Freq: Every day | ORAL | 0 refills | Status: AC

## 2022-10-01 NOTE — Telephone Encounter (Signed)
Patient to clinic this am with RN Evlyn Kanner.  Hot red swollen great toe/joint.  Able to wear closed toed shoes but painful.  Had recently decreased metformin from 1000mg  to 500mg .  History of elevated uric acid.  Patient preferred pharmacy [facility].  Discussed increase water intake, avoid added sugars in diet today.  Restart metformin 1000mg  po BID during possible gout flare.  Recommend low purine diet, nsaid and if no relief start prednisone.  Will review epic and contact patient with further instructions.  Uric acid level today with RN kimrey.  Schedule appt with me tomorrow for re-evaluation. Patient and RN Evlyn Kanner verbalized understanding information/instructions and had no further questions at this time.   Epic reviewed last labs Jan 2023.  Most recent labs uric acid was normal  Medication list shows mobic use.  Contacted patient and she is to choose one nsaid and may continue use e.g. mobic 15mg  or ibuprofen 800mg  every 8 hours not both.  [employer] handouts on gout and low purine diet sent to patient.  She will start colchicine with NSAID.  Terri,   If you are taking meloxicam/mobic you should not take motrin/ibuprofen/advil/aleve/naproxen/naprosyn/diclofenac or voltaren also  If not taking meloxicam then ibuprofen 800mg  every 8 hours.   Colchicine 0.6mg  take 2 immediately and then take one more pill an hour later.  Next dose in 12 hours 1 tab continue every 12 hours until symptoms resolved for 48 hours.   Prednisone 40mg  daily if colchicine not helping/controlling pain (4 tabs) and will taper off once symptoms resolved.  Hold atorvastatin while on colchicine.  Electronic Rx colchicine 0.6mg  take 2 immediately then in 1 hour take one more tablet.  In 12 hours start 1 tablet po every 12 hours until symptoms resolved for 48 hours #60 RF0.  Prednisone 10mg  take 4 tabs po daily    Latest Reference Range & Units 06/19/22 08:40  Sodium 134 - 144 mmol/L 138  Potassium 3.5 - 5.2 mmol/L 4.2   Chloride 96 - 106 mmol/L 99  Glucose 70 - 99 mg/dL 99  BUN 6 - 24 mg/dL 19  Creatinine 0.57 - 1.00 mg/dL 0.85  Calcium 8.7 - 10.2 mg/dL 9.6  BUN/Creatinine Ratio 9 - 23  22  eGFR >59 mL/min/1.73 80  Phosphorus 3.0 - 4.3 mg/dL 4.5 (H)  Magnesium 1.6 - 2.3 mg/dL 1.9  Alkaline Phosphatase 44 - 121 IU/L 78  Albumin 3.8 - 4.9 g/dL 4.4  Albumin/Globulin Ratio 1.2 - 2.2  2.0  Uric Acid 3.0 - 7.2 mg/dL 7.1  AST 0 - 40 IU/L 17  ALT 0 - 32 IU/L 31  Total Protein 6.0 - 8.5 g/dL 6.6  Total Bilirubin 0.0 - 1.2 mg/dL 0.3  GGT 0 - 60 IU/L 35  Estimated CHD Risk 0.0 - 1.0 times avg.  < 0.5  LDH 119 - 226 IU/L 142  Total CHOL/HDL Ratio 0.0 - 4.4 ratio 2.5  Cholesterol, Total 100 - 199 mg/dL 149  HDL Cholesterol >39 mg/dL 60  Triglycerides 0 - 149 mg/dL 94  VLDL Cholesterol Cal 5 - 40 mg/dL 17  LDL Chol Calc (NIH) 0 - 99 mg/dL 72  Iron 27 - 159 ug/dL 64  Vitamin D, 25-Hydroxy 30.0 - 100.0 ng/mL 47.0  Globulin, Total 1.5 - 4.5 g/dL 2.2  WBC 3.4 - 10.8 x10E3/uL 6.7  RBC 3.77 - 5.28 x10E6/uL 5.37 (H)  Hemoglobin 11.1 - 15.9 g/dL 15.2  HCT 34.0 - 46.6 % 45.8  MCV 79 - 97 fL 85  MCH  26.6 - 33.0 pg 28.3  MCHC 31.5 - 35.7 g/dL 33.2  RDW 11.7 - 15.4 % 12.7  Platelets 150 - 450 x10E3/uL 412  Neutrophils Not Estab. % 64  Immature Granulocytes Not Estab. % 0  NEUT# 1.4 - 7.0 x10E3/uL 4.3  Lymphocyte # 0.7 - 3.1 x10E3/uL 1.7  Monocytes Absolute 0.1 - 0.9 x10E3/uL 0.4  Basophils Absolute 0.0 - 0.2 x10E3/uL 0.0  Immature Grans (Abs) 0.0 - 0.1 x10E3/uL 0.0  Lymphs Not Estab. % 26  Monocytes Not Estab. % 6  Basos Not Estab. % 1  Eos Not Estab. % 3  EOS (ABSOLUTE) 0.0 - 0.4 x10E3/uL 0.2  Hemoglobin A1C 4.8 - 5.6 % 6.0 (H)  Est. average glucose Bld gHb Est-mCnc mg/dL 126  TSH 0.450 - 4.500 uIU/mL 0.932  Thyroxine (T4) 4.5 - 12.0 ug/dL 8.9  Free Thyroxine Index 1.2 - 4.9  2.5  T3 Uptake Ratio 24 - 39 % 28  (H): Data is abnormally high  Patient prefers not to use prednisone if possible.  Took  ibuprofen 800mg  last night.

## 2022-10-01 NOTE — Progress Notes (Addendum)
Seen at [facility] Patient reports history of uric acid level elevated. This weekend noted pain red swelling and warm to right great toe. Notify NP ordering to push water avoid juices will started prednisone and take ibuprofen for pain. Follow up NP tomorrow. Uric acid level drawn. Lab drawn from right AC tolerated well no issues noted.

## 2022-10-02 ENCOUNTER — Encounter: Payer: Self-pay | Admitting: Registered Nurse

## 2022-10-02 ENCOUNTER — Ambulatory Visit: Payer: No Typology Code available for payment source | Admitting: Registered Nurse

## 2022-10-02 VITALS — BP 114/78 | HR 72

## 2022-10-02 DIAGNOSIS — M109 Gout, unspecified: Secondary | ICD-10-CM

## 2022-10-02 LAB — URIC ACID: Uric Acid: 6.5 mg/dL (ref 3.0–7.2)

## 2022-10-02 NOTE — Patient Instructions (Signed)
Low-Purine Eating Plan A low-purine eating plan involves making food choices to limit your purine intake. Purine is a kind of uric acid. Too much uric acid in your blood can cause certain conditions, such as gout and kidney stones. Eating a low-purine diet may help control these conditions. What are tips for following this plan? Shopping Avoid buying products that contain high-fructose corn syrup. Check for this on food labels. It is commonly found in many processed foods and soft drinks. Be sure to check for it in baked goods such as cookies, canned fruits, and cereals and cereal bars. Avoid buying veal, chicken breast with skin, lamb, and organ meats such as liver. These types of meats tend to have the highest purine content. Choose dairy products. These may lower uric acid levels. Avoid certain types of fish. Not all fish and seafood have high purine content. Examples with high purine content include anchovies, trout, tuna, sardines, and salmon. Avoid buying beverages that contain alcohol, particularly beer and hard liquor. Alcohol can affect the way your body gets rid of uric acid. Meal planning  Learn which foods do or do not affect you. If you find out that a food tends to cause your gout symptoms to flare up, avoid eating that food. You can enjoy foods that do not cause problems. If you have any questions about a food item, talk with your dietitian or health care provider. Reduce the overall amount of meat in your diet. When you do eat meat, choose ones with lower purine content. Include plenty of fruits and vegetables. Although some vegetables may have a high purine content--such as asparagus, mushrooms, spinach, or cauliflower--it has been shown that these do not contribute to uric acid blood levels as much. Consume at least 1 dairy serving a day. This has been shown to decrease uric acid levels. General information If you drink alcohol: Limit how much you have to: 0-1 drink a day for  women who are not pregnant. 0-2 drinks a day for men. Know how much alcohol is in a drink. In the [place] one drink equals one 12 oz bottle of beer (355 mL), one 5 oz glass of wine (148 mL), or one 1 oz glass of hard liquor (44 mL). Drink plenty of water. Try to drink enough to keep your urine pale yellow. Fluids can help remove uric acid from your body. Work with your health care provider and dietitian to develop a plan to achieve or maintain a healthy weight. Losing weight may help reduce uric acid in your blood. What foods are recommended? The following are some types of foods that are good choices when limiting purine intake: Fresh or frozen fruits and vegetables. Whole grains, breads, cereals, and pasta. Rice. Beans, peas, legumes. Nuts and seeds. Dairy products. Fats and oils. The items listed above may not be a complete list. Talk with a dietitian about what dietary choices are best for you. What foods are not recommended? Limit your intake of foods high in purines, including: Beer and other alcohol. Meat-based gravy or sauce. Canned or fresh fish, such as: Anchovies, sardines, herring, salmon, and tuna. Mussels and scallops. Codfish, trout, and haddock. Bacon, veal, chicken breast with skin, and lamb. Organ meats, such as: Liver or kidney. Tripe. Sweetbreads (thymus gland or pancreas). Wild game or goose. Yeast or yeast extract supplements. Drinks sweetened with high-fructose corn syrup, such as soda. Processed foods made with high-fructose corn syrup. The items listed above may not be a complete list of foods   and beverages you should limit. Contact a dietitian for more information. Summary Eating a low-purine diet may help control conditions caused by too much uric acid in the body, such as gout or kidney stones. Choose low-purine foods, limit alcohol, and limit high-fructose corn syrup. You will learn over time which foods do or do not affect you. If you find out that a  food tends to cause your gout symptoms to flare up, avoid eating that food. This information is not intended to replace advice given to you by your health care provider. Make sure you discuss any questions you have with your health care provider. Document Revised: 06/08/2021 Document Reviewed: 06/08/2021 Elsevier Patient Education  2023 Else[employer] Inc. Gout  Gout is a condition that causes painful swelling of the joints. Gout is a type of inflammation of the joints (arthritis). This condition is caused by having too much uric acid in the body. Uric acid is a chemical that forms when the body breaks down substances called purines. Purines are important for building body proteins. When the body has too much uric acid, sharp crystals can form and build up inside the joints. This causes pain and swelling. Gout attacks can happen quickly and may be very painful (acute gout). Over time, the attacks can affect more joints and become more frequent (chronic gout). Gout can also cause uric acid to build up under the skin and inside the kidneys. What are the causes? This condition is caused by too much uric acid in your blood. This can happen because: Your kidneys do not remove enough uric acid from your blood. This is the most common cause. Your body makes too much uric acid. This can happen with some cancers and cancer treatments. It can also occur if your body is breaking down too many red blood cells (hemolytic anemia). You eat too many foods that are high in purines. These foods include organ meats and some seafood. Alcohol, especially beer, is also high in purines. A gout attack may be triggered by trauma or stress. What increases the risk? The following factors may make you more likely to develop this condition: Having a family history of gout. Being female and middle-aged. Being female and having gone through menopause. Taking certain medicines, including aspirin, cyclosporine, diuretics, levodopa, and  niacin. Having an organ transplant. Having certain conditions, such as: Being obese. Lead poisoning. Kidney disease. A skin condition called psoriasis. Other factors include: Losing weight too quickly. Being dehydrated. Frequently drinking alcohol, especially beer. Frequently drinking beverages that are sweetened with a type of sugar called fructose. What are the signs or symptoms? An attack of acute gout happens quickly. It usually occurs in just one joint. The most common place is the big toe. Attacks often start at night. Other joints that may be affected include joints of the feet, ankle, knee, fingers, wrist, or elbow. Symptoms of this condition may include: Severe pain. Warmth. Swelling. Stiffness. Tenderness. The affected joint may be very painful to touch. Shiny, red, or purple skin. Chills and fever. Chronic gout may cause symptoms more frequently. More joints may be involved. You may also have white or yellow lumps (tophi) on your hands or feet or in other areas near your joints. How is this diagnosed? This condition is diagnosed based on your symptoms, your medical history, and a physical exam. You may have tests, such as: Blood tests to measure uric acid levels. Removal of joint fluid with a thin needle (aspiration) to look for uric acid crystals.   X-rays to look for joint damage. How is this treated? Treatment for this condition has two phases: treating an acute attack and preventing future attacks. Acute gout treatment may include medicines to reduce pain and swelling, including: NSAIDs, such as ibuprofen. Steroids. These are strong anti-inflammatory medicines that can be taken by mouth (orally) or injected into a joint. Colchicine. This medicine relieves pain and swelling when it is taken soon after an attack. It can be given by mouth or through an IV. Preventive treatment may include: Daily use of smaller doses of NSAIDs or colchicine. Use of a medicine that reduces  uric acid levels in your blood, such as allopurinol. Changes to your diet. You may need to see a dietitian about what to eat and drink to prevent gout. Follow these instructions at home: During a gout attack  If directed, put ice on the affected area. To do this: Put ice in a plastic bag. Place a towel between your skin and the bag. Leave the ice on for 20 minutes, 2-3 times a day. Remove the ice if your skin turns bright red. This is very important. If you cannot feel pain, heat, or cold, you have a greater risk of damage to the area. Raise (elevate) the affected joint above the level of your heart as often as possible. Rest the joint as much as possible. If the affected joint is in your leg, you may be given crutches to use. Follow instructions from your health care provider about eating or drinking restrictions. Avoiding future gout attacks Follow a low-purine diet as told by your dietitian or health care provider. Avoid foods and drinks that are high in purines, including liver, kidney, anchovies, asparagus, herring, mushrooms, mussels, and beer. Maintain a healthy weight or lose weight if you are overweight. If you want to lose weight, talk with your health care provider. Do not lose weight too quickly. Start or maintain an exercise program as told by your health care provider. Eating and drinking Avoid drinking beverages that contain fructose. Drink enough fluids to keep your urine pale yellow. If you drink alcohol: Limit how much you have to: 0-1 drink a day for women who are not pregnant. 0-2 drinks a day for men. Know how much alcohol is in a drink. In the [place] one drink equals one 12 oz bottle of beer (355 mL), one 5 oz glass of wine (148 mL), or one 1 oz glass of hard liquor (44 mL). General instructions Take over-the-counter and prescription medicines only as told by your health care provider. Ask your health care provider if the medicine prescribed to you requires you to  avoid driving or using machinery. Return to your normal activities as told by your health care provider. Ask your health care provider what activities are safe for you. Keep all follow-up visits. This is important. Where to find more information National Institutes of Health: www.niams.nih.gov Contact a health care provider if you have: Another gout attack. Continuing symptoms of a gout attack after 10 days of treatment. Side effects from your medicines. Chills or a fever. Burning pain when you urinate. Pain in your lower back or abdomen. Get help right away if you: Have severe or uncontrolled pain. Cannot urinate. Summary Gout is painful swelling of the joints caused by having too much uric acid in the body. The most common site for gout to occur is in the big toe, but it can affect other joints in the body. Medicines and dietary changes can help to   prevent and treat gout attacks. This information is not intended to replace advice given to you by your health care provider. Make sure you discuss any questions you have with your health care provider. Document Revised: 03/29/2021 Document Reviewed: 03/29/2021 Elsevier Patient Education  2023 Elsevier Inc.  

## 2022-10-02 NOTE — Progress Notes (Signed)
Subjective:    Patient ID: Christine Rojas, female    DOB: 07-Jun-1966, 57 y.o.   MRN: [id number]  57y/o caucasian female established patient here for evaluation right toe joint pain.  Swelling redness and pain has improved since taking ibuprofen regularly and starting colchicine.  Stopped atorvastatin as instructed while on colchicine and did not start prednisone but has with her at work.  Denied fever/chills/discharge/trauma.  Woke up Sunday with the toe pain and was swelling worse yesterday along with redness.  She reviewed gout and low purine diet information.  Has been drinking water and still taking her turmeric daily also 1 capsule.  She has been avoiding purine rich foods in the past 24 hours.  Did not trial cherries or tart cherry juice. Did take metformin 1000mg  po BID yesterday also.      Review of Systems  Constitutional:  Negative for activity change, appetite change, chills, diaphoresis, fatigue and fever.  Eyes:  Negative for photophobia and visual disturbance.  Respiratory:  Negative for cough, shortness of breath, wheezing and stridor.   Cardiovascular:  Negative for leg swelling.  Gastrointestinal:  Negative for abdominal distention, abdominal pain, diarrhea, nausea and vomiting.  Endocrine: Negative for cold intolerance and heat intolerance.  Genitourinary:  Negative for difficulty urinating.  Musculoskeletal:  Negative for gait problem.  Skin:  Positive for color change and rash. Negative for pallor and wound.  Allergic/Immunologic: Positive for environmental allergies.  Neurological:  Negative for tremors, syncope, weakness and numbness.  Psychiatric/Behavioral:  Negative for agitation, confusion and sleep disturbance.        Objective:   Physical Exam Vitals and nursing note reviewed.  Constitutional:      General: She is awake. She is not in acute distress.    Appearance: Normal appearance. She is well-developed and well-groomed. She is obese. She is not  ill-appearing, toxic-appearing or diaphoretic.  HENT:     Head: Normocephalic and atraumatic.     Jaw: There is normal jaw occlusion.     Salivary Glands: Right salivary gland is not diffusely enlarged. Left salivary gland is not diffusely enlarged.     Right Ear: Hearing and external ear normal.     Left Ear: Hearing and external ear normal.     Nose: Nose normal. No congestion or rhinorrhea.     Mouth/Throat:     Lips: Pink. No lesions.     Mouth: Mucous membranes are moist. No oral lesions or angioedema.     Dentition: No gum lesions.     Tongue: No lesions. Tongue does not deviate from midline.     Palate: No mass and lesions.     Pharynx: Uvula midline. Pharyngeal swelling and posterior oropharyngeal erythema present. No oropharyngeal exudate or uvula swelling.     Comments: Bilateral  allergic shiners; cobblestoning posterior pharynx; bilateral lower eyelid nonpitting edema +1/4 Eyes:     General: Lids are normal. Vision grossly intact. Gaze aligned appropriately. Allergic shiner present. No scleral icterus.       Right eye: No discharge.        Left eye: No discharge.     Extraocular Movements: Extraocular movements intact.     Conjunctiva/sclera: Conjunctivae normal.     Pupils: Pupils are equal, round, and reactive to light.  Neck:     Trachea: Trachea and phonation normal. No tracheal deviation.  Cardiovascular:     Rate and Rhythm: Normal rate and regular rhythm.     Pulses: Normal pulses.  Radial pulses are 2+ on the right side and 2+ on the left side.       Dorsalis pedis pulses are 2+ on the right side and 2+ on the left side.  Pulmonary:     Effort: Pulmonary effort is normal. No respiratory distress.     Breath sounds: Normal breath sounds and air entry. No stridor or transmitted upper airway sounds. No wheezing, rhonchi or rales.     Comments: Spoke full sentences without difficulty; no cough observed in exam room Abdominal:     General: Abdomen is flat.      Palpations: Abdomen is soft.  Musculoskeletal:        General: Swelling and tenderness present. No signs of injury. Normal range of motion.     Right hand: Normal strength. Normal capillary refill.     Left hand: Normal strength. Normal capillary refill.     Cervical back: Normal range of motion and neck supple. No edema, erythema, signs of trauma, rigidity, torticollis or crepitus. No pain with movement. Normal range of motion.     Right lower leg: No tenderness or bony tenderness. No edema.     Left lower leg: No edema.     Right ankle: No swelling, deformity, ecchymosis or lacerations. No tenderness. Normal range of motion.     Right foot: Normal range of motion and normal capillary refill. Swelling and tenderness present. No deformity, Charcot foot, foot drop, laceration, bony tenderness or crepitus.     Comments: Erythema nummular overlying MTP 1st right joint diameter 2cm faint mild increased temperature not exquisitely TTP after 3 doses colchicine and 4 doses ibuprofen  Lymphadenopathy:     Head:     Right side of head: No submandibular or preauricular adenopathy.     Left side of head: No submandibular or preauricular adenopathy.     Cervical: No cervical adenopathy.     Right cervical: No superficial cervical adenopathy.    Left cervical: No superficial cervical adenopathy.  Skin:    General: Skin is warm and dry.     Capillary Refill: Capillary refill takes less than 2 seconds.     Coloration: Skin is not ashen, cyanotic, jaundiced, mottled, pale or sallow.     Findings: Erythema and rash present. No abrasion, abscess, acne, bruising, burn, ecchymosis, signs of injury, laceration, lesion, petechiae or wound. Rash is macular. Rash is not crusting, nodular, papular, purpuric, pustular, scaling, urticarial or vesicular.     Nails: There is no clubbing.  Neurological:     General: No focal deficit present.     Mental Status: She is alert and oriented to person, place, and time.  Mental status is at baseline.     GCS: GCS eye subscore is 4. GCS verbal subscore is 5. GCS motor subscore is 6.     Cranial Nerves: No cranial nerve deficit, dysarthria or facial asymmetry.     Sensory: Sensation is intact.     Motor: Motor function is intact. No weakness, tremor, atrophy, abnormal muscle tone or seizure activity.     Coordination: Coordination is intact. Coordination normal.     Gait: Gait is intact. Gait normal.     Comments: In/out of chair without difficulty; gait sure and steady in clinic; bilateral hand grasp equal 5/5; wearing sneakers laced to her usual tightness closed toed shoes required at work  Psychiatric:        Attention and Perception: Attention and perception normal.        Mood  and Affect: Mood and affect normal.        Speech: Speech normal.        Behavior: Behavior normal. Behavior is cooperative.        Thought Content: Thought content normal.        Cognition and Memory: Cognition and memory normal.        Judgment: Judgment normal.     Uric acid level normal yesterday.  Discussed all lab results with patient completed in the previous 3 months.  Patient verbalized understanding information/instructions and had no further questions at this time.  Latest Reference Range & Units 06/19/22 08:40 09/27/22 08:30  Sodium 134 - 144 mmol/L 138   Potassium 3.5 - 5.2 mmol/L 4.2   Chloride 96 - 106 mmol/L 99   Glucose 70 - 99 mg/dL 99   BUN 6 - 24 mg/dL 19   Creatinine 0.57 - 1.00 mg/dL 0.85   Calcium 8.7 - 10.2 mg/dL 9.6   BUN/Creatinine Ratio 9 - 23  22   eGFR >59 mL/min/1.73 80   Phosphorus 3.0 - 4.3 mg/dL 4.5 (H)   Magnesium 1.6 - 2.3 mg/dL 1.9   Alkaline Phosphatase 44 - 121 IU/L 78   Albumin 3.8 - 4.9 g/dL 4.4   Albumin/Globulin Ratio 1.2 - 2.2  2.0   Uric Acid 3.0 - 7.2 mg/dL 7.1   AST 0 - 40 IU/L 17   ALT 0 - 32 IU/L 31   Total Protein 6.0 - 8.5 g/dL 6.6   Total Bilirubin 0.0 - 1.2 mg/dL 0.3   GGT 0 - 60 IU/L 35   Estimated CHD Risk 0.0 -  1.0 times avg.  < 0.5   LDH 119 - 226 IU/L 142   Total CHOL/HDL Ratio 0.0 - 4.4 ratio 2.5   Cholesterol, Total 100 - 199 mg/dL 149   HDL Cholesterol >39 mg/dL 60   Triglycerides 0 - 149 mg/dL 94   VLDL Cholesterol Cal 5 - 40 mg/dL 17   LDL Chol Calc (NIH) 0 - 99 mg/dL 72   Iron 27 - 159 ug/dL 64   Vitamin D, 25-Hydroxy 30.0 - 100.0 ng/mL 47.0   Globulin, Total 1.5 - 4.5 g/dL 2.2   WBC 3.4 - 10.8 x10E3/uL 6.7 6.3  RBC 3.77 - 5.28 x10E6/uL 5.37 (H) 5.29 (H)  Hemoglobin 11.1 - 15.9 g/dL 15.2 15.2  HCT 34.0 - 46.6 % 45.8 47.1 (H)  MCV 79 - 97 fL 85 89  MCH 26.6 - 33.0 pg 28.3 28.7  MCHC 31.5 - 35.7 g/dL 33.2 32.3  RDW 11.7 - 15.4 % 12.7 12.7  Platelets 150 - 450 x10E3/uL 412 369  Neutrophils Not Estab. % 64 64  Immature Granulocytes Not Estab. % 0 0  NEUT# 1.4 - 7.0 x10E3/uL 4.3 4.0  Lymphocyte # 0.7 - 3.1 x10E3/uL 1.7 1.6  Monocytes Absolute 0.1 - 0.9 x10E3/uL 0.4 0.5  Basophils Absolute 0.0 - 0.2 x10E3/uL 0.0 0.0  Immature Grans (Abs) 0.0 - 0.1 x10E3/uL 0.0 0.0  Lymphs Not Estab. % 26 26  Monocytes Not Estab. % 6 7  Basos Not Estab. % 1 0  Eos Not Estab. % 3 3  EOS (ABSOLUTE) 0.0 - 0.4 x10E3/uL 0.2 0.2  Hemoglobin A1C 4.8 - 5.6 % 6.0 (H) 6.0 (H)  Est. average glucose Bld gHb Est-mCnc mg/dL 126 126  TSH 0.450 - 4.500 uIU/mL 0.932   Thyroxine (T4) 4.5 - 12.0 ug/dL 8.9   Free Thyroxine Index 1.2 - 4.9  2.5  T3 Uptake Ratio 24 - 39 % 28   (H): Data is abnormally high      Latest Reference Range & Units 09/27/22 08:30 09/27/22 12:20  WBC 3.4 - 10.8 x10E3/uL 6.3   RBC 3.77 - 5.28 x10E6/uL 5.29 (H)   Hemoglobin 11.1 - 15.9 g/dL 15.2   HCT 34.0 - 46.6 % 47.1 (H)   MCV 79 - 97 fL 89   MCH 26.6 - 33.0 pg 28.7   MCHC 31.5 - 35.7 g/dL 32.3   RDW 11.7 - 15.4 % 12.7   Platelets 150 - 450 x10E3/uL 369   Neutrophils Not Estab. % 64   Immature Granulocytes Not Estab. % 0   NEUT# 1.4 - 7.0 x10E3/uL 4.0   Lymphocyte # 0.7 - 3.1 x10E3/uL 1.6   Monocytes Absolute 0.1 - 0.9 x10E3/uL  0.5   Basophils Absolute 0.0 - 0.2 x10E3/uL 0.0   Immature Grans (Abs) 0.0 - 0.1 x10E3/uL 0.0   Lymphs Not Estab. % 26   Monocytes Not Estab. % 7   Basos Not Estab. % 0   Eos Not Estab. % 3   EOS (ABSOLUTE) 0.0 - 0.4 x10E3/uL 0.2   POC Glucose 70 - 99 mg/dl  104 !  Hemoglobin A1C 4.8 - 5.6 % 6.0 (H)   Est. average glucose Bld gHb Est-mCnc mg/dL 126   (H): Data is abnormally high !: Data is abnormal  Latest Reference Range & Units 10/01/22 08:25  Uric Acid 3.0 - 7.2 mg/dL 6.5   Assessment & Plan:  A-gout acute toe of right foot initial encounter  P-continue colchicine 0.6mg  po BID until all symptoms resolved x 48 hours; ibuprofen 800mg  po TID prn pain.  May restart atorvastatin 24 hours after last colchicine dose.  Continue metformin 1000mg  po BID during gout flare also.  Take with meal. Acute flare started in previous 48 hours.  Discussed increase water intake to have clear urine in toilet and voiding every 2-4 hours while awake.  May continue turmeric 1 capsule daily and apply paste to toe 1 teaspoon ground turmeric in honey/yogurt/coconut oil 2 tablespoons leave on 10-15 minutes then wash off.  The nature of gout is fully explained, including dietary relationship, acute and interval phase and treatment of both. Long term complications such as kidney stones, tophi and arthritis are discussed. Avoidance of alcohol recommended, and written literature is given low purine diet/gout exitcare handouts printed.  Discussed if acute flare up may take 2 colchicine tabs and then take 1 again in 1 hour and continue BID dosing until symptoms resolved 48 hours. May take ibuprofen 800mg  po TID prn pain.  Hold prednisone at this time unless symptoms worsening despite colchicine/ibuprofen/low purine diet/hydration.  Call if further attacks occur, or this one does not resolve promptly. Avoid dehydration. Discussed small to low portions of purine rich foods during flare.  Patient verbalized understanding  information/instructions, agreed with plan of care and had no further questions at this time.

## 2022-10-09 ENCOUNTER — Other Ambulatory Visit (HOSPITAL_COMMUNITY): Payer: Self-pay

## 2022-10-23 ENCOUNTER — Ambulatory Visit: Payer: No Typology Code available for payment source | Admitting: Occupational Medicine

## 2022-10-23 VITALS — BP 131/77 | HR 78

## 2022-10-23 DIAGNOSIS — E1169 Type 2 diabetes mellitus with other specified complication: Secondary | ICD-10-CM

## 2022-10-23 LAB — GLUCOSE, POCT (MANUAL RESULT ENTRY): POC Glucose: 90 mg/dl (ref 70–99)

## 2022-10-23 NOTE — Progress Notes (Signed)
Patient felt dizzy wanted Blood sugar and blood pressure checked. All stable. Going to bring BP log back for NP to look at it.

## 2022-10-24 ENCOUNTER — Other Ambulatory Visit (HOSPITAL_COMMUNITY): Payer: Self-pay

## 2022-10-24 NOTE — Telephone Encounter (Signed)
Status: Final result     Visible to patient: Yes (seen)     Next appt: 11/14/2022 at 04:00 PM in Podiatry ([place], [place])     Dx: Type 2 diabetes mellitus with other s...   1 Result Note     1 Patient Communication     1 HM Topic          Component Ref Range & Units 3 wk ago (09/27/22) 4 mo ago (06/19/22) 7 mo ago (03/05/22) 11 mo ago (11/21/21) 11 mo ago (11/17/21) 1 yr ago (07/20/21) 1 yr ago (01/16/21)  Hgb A1c MFr Bld 4.8 - 5.6 % 6.0 High  6.0 High  CM 6.4 High  CM 7.2 High  CM 7.2 High  CM 6.8 High  CM 6.8 High  CM  Comment:          Prediabetes: 5.7 - 6.4          Diabetes: >6.4          Glycemic control for adults with diabetes: <7.0  Est. average glucose Bld gHb Est-mCnc mg/dL 161 096 045      Resulting Agency [facility]         Narrative Performed byVerdell Carmine Performed at:  [address] [facility] [address] [place], [place]  [id number] Lab Director: Jolene Schimke MD, Phone:  [phone]    Specimen Collected: 09/27/22 08:30 Last Resulted: 09/28/22 05:36      See results note

## 2022-10-24 NOTE — Telephone Encounter (Signed)
Patient seen in clinic 10/02/22 see office note.  Patient notified she may take medications as prescribed but to hold future doses atorvastatin until new medicine use completed.  Patient agreed with plan of care and had no further questions.  All symptoms resolved seen in clinic 10/23/22

## 2022-10-25 ENCOUNTER — Other Ambulatory Visit (HOSPITAL_COMMUNITY): Payer: Self-pay

## 2022-10-25 MED ORDER — EZETIMIBE 10 MG PO TABS
10.0000 mg | ORAL_TABLET | Freq: Every day | ORAL | 0 refills | Status: DC
  Filled 2022-10-25: qty 90, 90d supply, fill #0

## 2022-10-26 ENCOUNTER — Other Ambulatory Visit (HOSPITAL_COMMUNITY): Payer: Self-pay

## 2022-10-26 ENCOUNTER — Encounter: Payer: Self-pay | Admitting: Registered Nurse

## 2022-10-26 ENCOUNTER — Telehealth: Payer: Self-pay | Admitting: Registered Nurse

## 2022-10-26 DIAGNOSIS — E861 Hypovolemia: Secondary | ICD-10-CM

## 2022-10-26 NOTE — Telephone Encounter (Signed)
Patient seen in workcenter stated no symptoms since Tuesday feeling well denied concerns.  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  Skin warm dry and pink.

## 2022-10-26 NOTE — Telephone Encounter (Signed)
Patient brought BP log for last week home/work readings when experiencing dizziness.  Greater than change sitting to standing on 2 dates in past week.  Occurred Sunday and Monday.  Patient reported when at home on the weekends she does not drink as much water as when she is at work.  Meals approximately the same.  Patient did not have pulse on BP log.  Times of symptoms typically afternoon weekend temperatures high 80s this past weekend/week.  Discussed with patient to continue taking her BP and write down pulse also on log if having symptoms date and time.  Increase water intake on weekends to match her intake during the week.  Discussed BP log showing dehydration on dates she was was symptomatic. Discussed may need more water this week with temperatures expected to be in the 90s. Patient A&Ox3 spoke full sentences without difficulty respirations even and unlabored gait sure and steady skin warm dry and pink.  Patient agreed with plan of care and had no further questions at this time.

## 2022-10-29 ENCOUNTER — Other Ambulatory Visit (HOSPITAL_COMMUNITY): Payer: Self-pay

## 2022-11-07 ENCOUNTER — Other Ambulatory Visit (HOSPITAL_COMMUNITY): Payer: Self-pay

## 2022-11-12 ENCOUNTER — Other Ambulatory Visit (HOSPITAL_COMMUNITY): Payer: Self-pay

## 2022-11-14 ENCOUNTER — Ambulatory Visit: Payer: No Typology Code available for payment source | Admitting: Podiatry

## 2022-12-10 ENCOUNTER — Ambulatory Visit: Payer: No Typology Code available for payment source | Admitting: Podiatry

## 2022-12-11 ENCOUNTER — Other Ambulatory Visit (HOSPITAL_COMMUNITY): Payer: Self-pay

## 2022-12-23 NOTE — Progress Notes (Signed)
Noted patient has had follow up labs 

## 2022-12-25 ENCOUNTER — Encounter: Payer: Self-pay | Admitting: Registered Nurse

## 2022-12-25 ENCOUNTER — Ambulatory Visit: Payer: No Typology Code available for payment source | Admitting: Occupational Medicine

## 2022-12-25 ENCOUNTER — Telehealth: Payer: Self-pay | Admitting: Registered Nurse

## 2022-12-25 DIAGNOSIS — E1169 Type 2 diabetes mellitus with other specified complication: Secondary | ICD-10-CM

## 2022-12-25 DIAGNOSIS — Z0000 Encounter for general adult medical examination without abnormal findings: Secondary | ICD-10-CM

## 2022-12-25 NOTE — Telephone Encounter (Signed)
Signed Be Well 2025 provider paperwork patient met insurance discount requirements RN Kimrey to notify HR team and she notified patient met requirements.

## 2022-12-25 NOTE — Progress Notes (Signed)
Be well insurance premium discount evaluation: Met   Patient completed PCM office visit. Epic reviewed by RN Kimrey transcribed labs and reviewed with the patient. Scheduled follow up labs.   Tobacco attestation signed. [facility] ROI formed signed. Forms placed in the chart.   Patient given handouts for [facility] pharmacies and discount drugs list, MyChart, [facility], [facility], [facility] and [facility].  What to do for infectious illness protocol. Given handout for list of medications that can be filled at [facility]. Given Clinic hours and Clinic Email.  

## 2022-12-25 NOTE — Telephone Encounter (Signed)
Patient requested [facility] refill metformin 500mg  po qam taking 1000mg  po qpm and has enough supply at home of 1000mg  and other medications not needing at this time.  Dispensed 90 tabs to patient from [facility].  Has been on ozempic losing weight and metforming dose decreased from 1000mg  po BID to pm and now 500mg  am.  Last labs Hgba1c 6.0 09/27/22 and same 06/19/22  Last dispense 09/18/22.    Given 90 day supply today.

## 2022-12-25 NOTE — Progress Notes (Signed)
Lab drawn from right AC tolerated well no issues noted.  

## 2022-12-26 LAB — HEMOGLOBIN A1C
Est. average glucose Bld gHb Est-mCnc: 131 mg/dL
Hgb A1c MFr Bld: 6.2 % — ABNORMAL HIGH (ref 4.8–5.6)

## 2023-01-07 ENCOUNTER — Other Ambulatory Visit: Payer: Self-pay

## 2023-01-07 ENCOUNTER — Other Ambulatory Visit (HOSPITAL_COMMUNITY): Payer: Self-pay

## 2023-01-07 MED ORDER — EZETIMIBE 10 MG PO TABS
10.0000 mg | ORAL_TABLET | Freq: Every day | ORAL | 3 refills | Status: DC
  Filled 2023-01-07 (×2): qty 90, 90d supply, fill #0
  Filled 2023-04-08 (×2): qty 90, 90d supply, fill #1
  Filled 2023-08-17 – 2023-08-21 (×2): qty 90, 90d supply, fill #2
  Filled 2023-11-01: qty 90, 90d supply, fill #3

## 2023-01-07 MED ORDER — OZEMPIC (1 MG/DOSE) 4 MG/3ML ~~LOC~~ SOPN
1.0000 mg | PEN_INJECTOR | SUBCUTANEOUS | 1 refills | Status: AC
  Filled 2023-01-07 (×2): qty 3, 28d supply, fill #0
  Filled 2023-02-11: qty 3, 28d supply, fill #1

## 2023-01-28 ENCOUNTER — Telehealth: Payer: Self-pay | Admitting: Registered Nurse

## 2023-01-28 ENCOUNTER — Encounter: Payer: Self-pay | Admitting: Registered Nurse

## 2023-01-28 DIAGNOSIS — I10 Essential (primary) hypertension: Secondary | ICD-10-CM

## 2023-01-28 DIAGNOSIS — E1169 Type 2 diabetes mellitus with other specified complication: Secondary | ICD-10-CM

## 2023-01-28 DIAGNOSIS — K219 Gastro-esophageal reflux disease without esophagitis: Secondary | ICD-10-CM

## 2023-01-28 DIAGNOSIS — E7800 Pure hypercholesterolemia, unspecified: Secondary | ICD-10-CM

## 2023-01-28 NOTE — Telephone Encounter (Signed)
Per epic review last fill [facility] metformin 500mg  po qam 12/25/22  Paitent notified clinic closed today and NP will be onsite tomorrow to fill.  Last labs  Latest Reference Range & Units 06/19/22 08:40 09/27/22 08:30 09/27/22 12:20 10/01/22 08:25 10/23/22 11:30 12/25/22 09:00  Sodium 134 - 144 mmol/L 138       Potassium 3.5 - 5.2 mmol/L 4.2       Chloride 96 - 106 mmol/L 99       Glucose 70 - 99 mg/dL 99       BUN 6 - 24 mg/dL 19       Creatinine 2.02 - 1.00 mg/dL 5.42       Calcium 8.7 - 10.2 mg/dL 9.6       BUN/Creatinine Ratio 9 - 23  22       eGFR >59 mL/min/1.73 80       Phosphorus 3.0 - 4.3 mg/dL 4.5 (H)       Magnesium 1.6 - 2.3 mg/dL 1.9       Alkaline Phosphatase 44 - 121 IU/L 78       Albumin 3.8 - 4.9 g/dL 4.4       Albumin/Globulin Ratio 1.2 - 2.2  2.0       Uric Acid 3.0 - 7.2 mg/dL 7.1   6.5    AST 0 - 40 IU/L 17       ALT 0 - 32 IU/L 31       Total Protein 6.0 - 8.5 g/dL 6.6       Total Bilirubin 0.0 - 1.2 mg/dL 0.3       GGT 0 - 60 IU/L 35       Estimated CHD Risk 0.0 - 1.0 times avg.  < 0.5       LDH 119 - 226 IU/L 142       Total CHOL/HDL Ratio 0.0 - 4.4 ratio 2.5       Cholesterol, Total 100 - 199 mg/dL 706       HDL Cholesterol >39 mg/dL 60       Triglycerides 0 - 149 mg/dL 94       VLDL Cholesterol Cal 5 - 40 mg/dL 17       LDL Chol Calc (NIH) 0 - 99 mg/dL 72       Iron 27 - 237 ug/dL 64       Vitamin D, 62-GBTDVVO 30.0 - 100.0 ng/mL 47.0       Globulin, Total 1.5 - 4.5 g/dL 2.2       WBC 3.4 - 16.0 x10E3/uL 6.7 6.3      RBC 3.77 - 5.28 x10E6/uL 5.37 (H) 5.29 (H)      Hemoglobin 11.1 - 15.9 g/dL 73.7 10.6      HCT 26.9 - 46.6 % 45.8 47.1 (H)      MCV 79 - 97 fL 85 89      MCH 26.6 - 33.0 pg 28.3 28.7      MCHC 31.5 - 35.7 g/dL 48.5 46.2      RDW 70.3 - 15.4 % 12.7 12.7      Platelets 150 - 450 x10E3/uL 412 369      Neutrophils Not Estab. % 64 64      Immature Granulocytes Not Estab. % 0 0      NEUT# 1.4 - 7.0 x10E3/uL 4.3 4.0      Lymphocyte # 0.7 - 3.1  x10E3/uL 1.7 1.6      Monocytes  Absolute 0.1 - 0.9 x10E3/uL 0.4 0.5      Basophils Absolute 0.0 - 0.2 x10E3/uL 0.0 0.0      Immature Grans (Abs) 0.0 - 0.1 x10E3/uL 0.0 0.0      Lymphs Not Estab. % 26 26      Monocytes Not Estab. % 6 7      Basos Not Estab. % 1 0      Eos Not Estab. % 3 3      EOS (ABSOLUTE) 0.0 - 0.4 x10E3/uL 0.2 0.2      POC Glucose 70 - 99 mg/dl   409 !  90   Hemoglobin A1C 4.8 - 5.6 % 6.0 (H) 6.0 (H)    6.2 (H)  Est. average glucose Bld gHb Est-mCnc mg/dL 811 914    782  TSH 9.562 - 4.500 uIU/mL 0.932       Thyroxine (T4) 4.5 - 12.0 ug/dL 8.9       Free Thyroxine Index 1.2 - 4.9  2.5       T3 Uptake Ratio 24 - 39 % 28       (H): Data is abnormally high !: Data is abnormal

## 2023-01-29 MED ORDER — HYDROCHLOROTHIAZIDE 25 MG PO TABS: 25.0000 mg | ORAL_TABLET | Freq: Every day | ORAL | 1 refills | Status: DC

## 2023-01-29 MED ORDER — ATORVASTATIN CALCIUM 40 MG PO TABS: 40.0000 mg | ORAL_TABLET | Freq: Every day | ORAL | Status: DC

## 2023-01-29 MED ORDER — METFORMIN HCL 1000 MG PO TABS: 1000.0000 mg | ORAL_TABLET | Freq: Every day | ORAL | Status: DC

## 2023-01-29 NOTE — Telephone Encounter (Signed)
Per paper chart review last 90 day supply dispensed 09/18/22 for metformin 1000mg  po qam, lisinopril 10mg  po daily, atorvastatin 40mg  po daily, omeprazole DR 40mg  po daily and hydrochlorothiazide 25 mg po daily.  Metformin 500mg  po qpm last dispensed 90 tabs 12/10/22  Patient given 90 day supply of all 09/18/22 dispense medications today from [facility].

## 2023-02-11 ENCOUNTER — Other Ambulatory Visit (HOSPITAL_COMMUNITY): Payer: Self-pay

## 2023-02-14 ENCOUNTER — Other Ambulatory Visit: Payer: Self-pay

## 2023-02-15 ENCOUNTER — Other Ambulatory Visit (HOSPITAL_COMMUNITY): Payer: Self-pay

## 2023-03-04 ENCOUNTER — Ambulatory Visit: Payer: No Typology Code available for payment source

## 2023-03-04 DIAGNOSIS — M25571 Pain in right ankle and joints of right foot: Secondary | ICD-10-CM

## 2023-03-04 NOTE — Progress Notes (Signed)
Pt reports right ankle pain with internal rotation x1 year. Pt states that the pain is intermittent and that nothing makes the pain better or worse.

## 2023-03-05 ENCOUNTER — Ambulatory Visit: Payer: No Typology Code available for payment source | Admitting: Registered Nurse

## 2023-03-05 ENCOUNTER — Encounter: Payer: Self-pay | Admitting: Registered Nurse

## 2023-03-05 VITALS — BP 110/70 | HR 83 | Temp 98.1°F

## 2023-03-05 DIAGNOSIS — Q899 Congenital malformation, unspecified: Secondary | ICD-10-CM | POA: Insufficient documentation

## 2023-03-05 DIAGNOSIS — R748 Abnormal levels of other serum enzymes: Secondary | ICD-10-CM | POA: Insufficient documentation

## 2023-03-05 DIAGNOSIS — G5710 Meralgia paresthetica, unspecified lower limb: Secondary | ICD-10-CM | POA: Insufficient documentation

## 2023-03-05 DIAGNOSIS — F439 Reaction to severe stress, unspecified: Secondary | ICD-10-CM | POA: Insufficient documentation

## 2023-03-05 DIAGNOSIS — Z309 Encounter for contraceptive management, unspecified: Secondary | ICD-10-CM | POA: Insufficient documentation

## 2023-03-05 DIAGNOSIS — G5712 Meralgia paresthetica, left lower limb: Secondary | ICD-10-CM | POA: Insufficient documentation

## 2023-03-05 DIAGNOSIS — M25571 Pain in right ankle and joints of right foot: Secondary | ICD-10-CM

## 2023-03-05 DIAGNOSIS — F4320 Adjustment disorder, unspecified: Secondary | ICD-10-CM | POA: Insufficient documentation

## 2023-03-05 MED ORDER — BIOFREEZE PROFESSIONAL 5 % EX GEL: 1.0000 | Freq: Four times a day (QID) | CUTANEOUS | Status: DC | PRN

## 2023-03-05 MED ORDER — ACETAMINOPHEN 500 MG PO TABS: 1000.0000 mg | ORAL_TABLET | Freq: Four times a day (QID) | ORAL | Status: AC | PRN

## 2023-03-05 NOTE — Patient Instructions (Addendum)
Ankle Pain The ankle joint helps you stand on your leg and allows you to move around. Ankle pain can happen on either side or the back of the ankle. You may have pain in one ankle or both ankles. Ankle pain may be sharp and burning or dull and aching. There may be tenderness, stiffness, redness, or warmth around the ankle. Many things can cause ankle pain. These include an injury to the area and overuse of your ankle. Follow these instructions at home: Activity Rest your ankle as told by your health care provider. Avoid doing things that cause ankle pain. Do not use the injured limb to support your body weight until your provider says that you can. Use crutches as told by your provider. Ask your provider when it is safe to drive if you have a brace on your ankle. Do exercises as told by your provider. If you have a removable brace: Wear the brace as told by your provider. Remove it only as told by your provider. Check the skin around the brace every day. Tell your provider about any concerns. Loosen the brace if your toes tingle, become numb, or turn cold and blue. Keep the brace clean. If the brace is not waterproof: Do not let it get wet. Cover it with a watertight covering when you take a bath or shower. If you have an elastic bandage:  Remove it when you take a bath or a shower. Try not to move your ankle much. Wiggle your toes from time to time. This helps to prevent swelling. Adjust the bandage if it feels too tight. Loosen the bandage if your foot tingles, becomes numb, or turns cold and blue. Managing pain, stiffness, and swelling  If told, put ice on the painful area. If you have a removable brace or elastic bandage, remove it as told by your provider. Put ice in a plastic bag. Place a towel between your skin and the bag. Leave the ice on for 20 minutes, 2-3 times a day. If your skin turns bright red, remove the ice right away to prevent skin damage. The risk of damage is  higher if you cannot feel pain, heat, or cold. Move your toes often to reduce stiffness and swelling. Raise (elevate) your ankle above the level of your heart while you are sitting or lying down. General instructions Take over-the-counter and prescription medicines only as told by your provider. To help you and your provider, write down: How often you have ankle pain. Where the pain is. What the pain feels like. If you are told to wear a certain shoe or insole, make sure you wear it the right way and for as long as you are told. Contact a health care provider if: Your pain gets worse. Your pain does not get better with medicine. You have a fever or chills. You have more trouble walking. You have new symptoms. Your foot, leg, toes, or ankle tingles, becomes numb or swollen, or turns cold and blue. This information is not intended to replace advice given to you by your health care provider. Make sure you discuss any questions you have with your health care provider. Document Revised: 04/18/2022 Document Reviewed: 04/18/2022 Elsevier Patient Education  2024 [employer] Ankle Sprain, Phase I Rehab An ankle sprain is an injury to the tissues that connect bone to bone (ligaments) in your ankle. Ankle sprains can cause stiffness, loss of motion, and loss of strength. Ask your health care provider which exercises are safe for  you. Do exercises exactly as told by your provider and adjust them as directed. It is normal to feel mild stretching, pulling, tightness, or discomfort as you do these exercises. Stop right away if you feel sudden pain or your pain gets worse. Do not begin these exercises until told by your provider. Stretching and range-of-motion exercises These exercises warm up your muscles and joints. They can improve the movement and flexibility of your lower leg and ankle. They also help to relieve pain and stiffness. Gastroc and soleus stretch This exercise is also called a calf  stretch. It stretches the muscles in the back of the lower leg. These muscles are the gastrocnemius, or gastroc, and the soleus. Sit on the floor with your left / right leg extended. Loop a belt or towel around the ball of your left / right foot. The ball of your foot is on the walking surface, right under your toes. Keep your left / right ankle and foot relaxed and keep your knee straight. Use the belt or towel to pull your foot toward you. You should feel a gentle stretch behind your calf or knee in your gastroc muscle. Hold this position for ____30______ seconds, then release to the starting position. Repeat the exercise with your knee bent. You can put a pillow or a rolled bath towel under your knee to support it. You should feel a stretch deep in your calf in the soleus muscle or at your Achilles tendon. Repeat ____3______ times. Complete this exercise ____2______ times a day. Ankle alphabet  Sit with your left / right leg supported at the lower leg. Do not rest your foot on anything. Make sure your foot has room to move freely. Think of your left / right foot as a paintbrush. Move your foot to trace each letter of the alphabet in the air. Keep your hip and knee still while you trace. Make the letters as large as you can without feeling discomfort. Trace every letter from A to Z. Repeat _____3_____ times. Complete this exercise ___2_______ times a day. Strengthening exercises These exercises build strength and endurance in your ankle and lower leg. Endurance is the ability to use your muscles for a long time, even after they get tired. Ankle dorsiflexion  Secure a rubber exercise band or tube to an object, such as a table leg, that will stay still when the band is pulled. Secure the other end around your left / right foot. Sit on the floor facing the object, with your left / right leg extended. The band or tube should be slightly tense when your foot is relaxed. Slowly bring your foot  toward you, bringing the top of your foot toward your shin (dorsiflexion), and pulling the band tighter. Hold this position for ____30_____ seconds. Slowly return your foot to the starting position. Repeat _____3_____ times. Complete this exercise ____2______ times a day. Ankle plantar flexion  Sit on the floor with your left / right leg extended. Loop a rubber exercise tube or band around the ball of your left / right foot. The ball of your foot is on the walking surface, right under your toes. Hold the ends of the band or tube in your hands. The band or tube should be slightly tense when your foot is relaxed. Slowly point your foot and toes downward to tilt the top of your foot away from your shin (plantar flexion). Hold this position for _30_________ seconds. Slowly return your foot to the starting position. Repeat ______3____ times.  Complete this exercise ____2______ times a day. Ankle eversion  Sit on the floor with your legs straight out in front of you. Loop a rubber exercise band or tube around the ball of your left / right foot. The ball of your foot is on the walking surface, right under your toes. Hold the ends of the band in your hands or secure the band to a stable object. The band or tube should be slightly tense when your foot is relaxed. Slowly push your foot outward, away from your other leg (eversion). Hold this position for ____30______ seconds. Slowly return your foot to the starting position. Repeat _____3_____ times. Complete this exercise _____2_____ times a day. This information is not intended to replace advice given to you by your health care provider. Make sure you discuss any questions you have with your health care provider. Document Revised: 04/18/2022 Document Reviewed: 04/18/2022 Elsevier Patient Education  2024 [employer] Ankle Sprain  An ankle sprain is a stretch or tear in a ligament in the ankle. Ligaments are tissues that connect bones to each  other. The two most common types of ankle sprains are: Inversion sprain. This happens when the foot turns inward and the ankle rolls outward. It affects the ligament on the outside of the foot (lateral ligament). Eversion sprain. This happens when the foot turns outward and the ankle rolls inward. It affects the ligament on the inner side of the foot (medial ligament). What are the causes? An ankle sprain is often caused by rolling or twisting the ankle by accident. What increases the risk? You are more likely to get an ankle sprain if you play sports. What are the signs or symptoms? Symptoms of an ankle sprain include: Pain in your ankle. Swelling. Bruising. Bruises may form right after you sprain your ankle or 1-2 days later. Trouble standing or walking. This includes trouble turning or changing directions. How is this diagnosed? An ankle sprain is diagnosed with a physical exam. Your health care provider will press on parts of your foot and ankle and try to move them in certain ways. You may also have X-rays taken. These may be done to see how severe the sprain is and to check for broken bones. How is this treated? An ankle sprain may be treated with: A brace or splint. This is used to keep the ankle from moving until it heals. An elastic bandage (dressing). This is used to support the ankle. Crutches. Pain medicine. Surgery. This may be needed if the sprain is severe. Physical therapy. This may help to improve the range of motion in the ankle. Follow these instructions at home: If you have a removable brace or a splint: Wear the brace or splint as told by your provider. Remove it only as told by your provider. Check the skin around the brace or splint every day. Tell your provider about any concerns. Loosen the brace or splint if your toes tingle, become numb, or turn cold and blue. Keep the brace or splint clean. If the brace or splint is not waterproof: Do not let it get  wet. Cover it with a watertight covering when you take a bath or a shower. If you have an elastic dressing: Take the dressing off to shower or bathe. If the dressing feels too tight, adjust it to make it more comfortable. Loosen the dressing if your foot tingles, becomes numb, or turns cold and blue. Managing pain, stiffness, and swelling If told, put ice on the affected  area. If you have a removable brace or splint, remove it as told by your provider. Put ice in a plastic bag. Place a towel between your skin and the bag. Leave the ice on for 20 minutes, 2-3 times a day. Remove the ice if your skin turns bright red. This is very important. If you cannot feel pain, heat, or cold, you have a greater risk of damage to the area. If your skin turns bright red, remove the ice right away to prevent skin damage. The risk of damage is higher if you cannot feel pain, heat, or cold. Move your toes often to reduce stiffness and swelling. For 2-3 days, raise (elevate) your ankle above the level of your heart while you are sitting or lying down. General instructions Take over-the-counter and prescription medicines only as told by your provider. Do not use any products that contain nicotine or tobacco. These products include cigarettes, chewing tobacco, and vaping devices, such as e-cigarettes. If you need help quitting, ask your provider. Rest your ankle. Do not use your ankle to support your body weight until your provider says that you can. Use crutches as told by your provider. Ask your provider when it is safe to drive if you have a brace or splint on your ankle. Contact a health care provider if: You have bruising or swelling that get worse all of a sudden. Your pain does not get better with medicine. Get help right away if: Your foot or toes become numb or blue. You have severe pain that gets worse. This information is not intended to replace advice given to you by your health care provider. Make  sure you discuss any questions you have with your health care provider. Document Revised: 03/28/2022 Document Reviewed: 03/28/2022 Elsevier Patient Education  2024 [employer].

## 2023-03-05 NOTE — Progress Notes (Signed)
Subjective:    Patient ID: Christine Rojas, female    DOB: 1965/10/12, 57 y.o.   MRN: [id number]  57y/o caucasian female established patient here for evaluation right ankle/foot pain with eversion radiates into foot sharp yesterday was 10/10 today much better. Worst pain was with weight bearing/walking. Denied known injury wondering if she has arthritis in ankle.  Denied history of injury/rash/swelling/bruising.  Shoes 57 year old "I wear my oldest shoes to work because everything gets dirty there"  Started a compression stocking this am and it seems to be helping pain much better today and AROM not as limited due to pain today.  Tried stretching yesterday and tylenol.      Review of Systems  HENT:  Negative for trouble swallowing and voice change.   Gastrointestinal:  Negative for diarrhea, nausea and vomiting.  Musculoskeletal:  Positive for gait problem and myalgias. Negative for joint swelling, neck pain and neck stiffness.  Skin:  Negative for color change, pallor, rash and wound.  Neurological:  Negative for tremors and weakness.  Hematological:  Does not bruise/bleed easily.  Psychiatric/Behavioral:  Negative for agitation, confusion and sleep disturbance.        Objective:   Physical Exam Vitals and nursing note reviewed.  Constitutional:      General: She is awake. She is not in acute distress.    Appearance: Normal appearance. She is well-developed and well-groomed. She is obese. She is not ill-appearing, toxic-appearing or diaphoretic.  HENT:     Head: Normocephalic and atraumatic.     Jaw: There is normal jaw occlusion.     Salivary Glands: Right salivary gland is not diffusely enlarged. Left salivary gland is not diffusely enlarged.     Right Ear: Hearing and external ear normal.     Left Ear: Hearing and external ear normal.     Nose: Nose normal. No congestion or rhinorrhea.     Mouth/Throat:     Lips: Pink. No lesions.     Mouth: Mucous membranes are moist.      Pharynx: Oropharynx is clear.  Eyes:     General: Lids are normal. Vision grossly intact. Gaze aligned appropriately. No scleral icterus.       Right eye: No discharge.        Left eye: No discharge.     Extraocular Movements: Extraocular movements intact.     Conjunctiva/sclera: Conjunctivae normal.     Pupils: Pupils are equal, round, and reactive to light.  Neck:     Trachea: Trachea normal.  Cardiovascular:     Rate and Rhythm: Normal rate and regular rhythm.     Pulses: Normal pulses.          Radial pulses are 2+ on the right side and 2+ on the left side.       Popliteal pulses are 2+ on the right side.       Dorsalis pedis pulses are 2+ on the right side.  Pulmonary:     Effort: Pulmonary effort is normal.     Breath sounds: Normal breath sounds and air entry. No stridor or transmitted upper airway sounds. No wheezing.     Comments: Spoke full sentences without difficulty; no cough observed in exam room Abdominal:     Palpations: Abdomen is soft.  Musculoskeletal:        General: Tenderness present. No swelling, deformity or signs of injury. Normal range of motion.     Right hand: Normal strength. Normal capillary refill.  Left hand: Normal strength. Normal capillary refill.     Cervical back: Normal range of motion and neck supple.     Right lower leg: No edema.     Left lower leg: No edema.     Right ankle: No swelling, deformity, ecchymosis or lacerations. Tenderness present over the lateral malleolus and medial malleolus. No base of 5th metatarsal or proximal fibula tenderness. Normal range of motion. Anterior drawer test negative.     Right Achilles Tendon: No tenderness.     Left ankle: No swelling, deformity, ecchymosis or lacerations. No tenderness. No lateral malleolus, medial malleolus or proximal fibula tenderness. Normal range of motion. Anterior drawer test negative.     Left Achilles Tendon: No tenderness.     Right foot: Normal range of motion and normal  capillary refill. No swelling, deformity, bunion, Charcot foot, foot drop, prominent metatarsal heads, laceration, tenderness, bony tenderness or crepitus. Normal pulse.       Feet:     Comments: Mildly ttp lateral and medial malleolus soft tissue right; pain with flexion/extension/eversion and inversion right ankle; plantar fascia/calcaneus/MTP/MTP joints not TTP; negative tip/fib squeeze test; no increased temp/hot spot/bruising/rash/weakness strength foot/lower leg 5/5  Lymphadenopathy:     Head:     Right side of head: No submandibular or preauricular adenopathy.     Left side of head: No submandibular or preauricular adenopathy.     Cervical:     Right cervical: No superficial cervical adenopathy.    Left cervical: No superficial cervical adenopathy.  Skin:    General: Skin is warm and dry.     Capillary Refill: Capillary refill takes less than 2 seconds.     Coloration: Skin is not ashen, cyanotic, jaundiced, mottled, pale or sallow.     Findings: No abrasion, abscess, acne, bruising, burn, ecchymosis, erythema, signs of injury, laceration, lesion, petechiae, rash or wound. Rash is not crusting, macular, nodular, papular, purpuric, pustular, scaling, urticarial or vesicular.     Nails: There is no clubbing.     Comments: Skin warm dry and pink and capillary refill less than 2 seconds  Neurological:     General: No focal deficit present.     Mental Status: She is alert and oriented to person, place, and time. Mental status is at baseline.     GCS: GCS eye subscore is 4. GCS verbal subscore is 5. GCS motor subscore is 6.     Cranial Nerves: No cranial nerve deficit, dysarthria or facial asymmetry.     Motor: Motor function is intact. No weakness, tremor, atrophy, abnormal muscle tone or seizure activity.     Coordination: Coordination is intact. Coordination normal.     Gait: Gait is intact. Gait normal.     Comments: In/out of chair and on/off exam table without difficulty; gait sure  and steady in clinic; bilateral hand grasp equal 5/5  Psychiatric:        Attention and Perception: Attention and perception normal.        Mood and Affect: Mood and affect normal.        Speech: Speech normal.        Behavior: Behavior normal. Behavior is cooperative.        Thought Content: Thought content normal.        Cognition and Memory: Cognition and memory normal.        Judgment: Judgment normal.           Assessment & Plan:  A-acute right ankle pain  P-Discussed shoes 58 year old replace as worn out midsole and treads worn off soles putting stress on foot mechanics and therfore ankle; continue compression sleeve right, cryotherapy 15 minutes QID prn pain/swelling.  Tylenol 1000mg  po qid prn pain or biofreeze gel topical qid prn pain. pain medication prn consider imaging if no improvement with plan of care 1 week rule out stress fracture or avulsion fracture patient preferred [facility] location if imaging required.  No known trauma or injury.  Gait sure and steady in clinic normal pace no observed limp and patient reported pain for the most part resolved today compared to yesterday wearing compression sleeve today.  [facility] handouts on ankle strain and rehab exercises and ankle pain.  Patient agreed with plan of care and had no further questions at this time.

## 2023-03-06 ENCOUNTER — Ambulatory Visit: Payer: No Typology Code available for payment source

## 2023-03-06 NOTE — Progress Notes (Signed)
S:  I am here tod o a respirator fit test. O:  Has 48M 6300 half face respirator with her including the vapor cartridges.  Has completed respiratory questionnaire also   BP 116/70, pulse 83, pO2 98%%, weight 217 lbs.  Questionaire reviewed with client and no concerns noted for safely wearing the respirator. P:  Will wear the respirator when doing tasks at work that require the PPE.  Instructed in self-test for fit to do each day she wears the respirator, cleaning and storage of the respirator.  Reece Packer, RN, BSN, MPH, [facility]

## 2023-03-12 ENCOUNTER — Other Ambulatory Visit (HOSPITAL_COMMUNITY): Payer: Self-pay

## 2023-03-14 ENCOUNTER — Other Ambulatory Visit (HOSPITAL_COMMUNITY): Payer: Self-pay

## 2023-03-26 ENCOUNTER — Encounter: Payer: Self-pay | Admitting: Registered Nurse

## 2023-03-26 ENCOUNTER — Telehealth: Payer: Self-pay | Admitting: Registered Nurse

## 2023-03-26 DIAGNOSIS — Z0279 Encounter for issue of other medical certificate: Secondary | ICD-10-CM

## 2023-03-26 NOTE — Telephone Encounter (Signed)
See paper record at [facility] test, medical evaluation questionnaire parts a and b completed/reviewed and discussed with patient 03/07/23.  Cleared for respirator use at [employer] to perform work duties.

## 2023-04-08 ENCOUNTER — Other Ambulatory Visit: Payer: Self-pay

## 2023-04-08 ENCOUNTER — Other Ambulatory Visit (HOSPITAL_COMMUNITY): Payer: Self-pay

## 2023-04-16 ENCOUNTER — Ambulatory Visit: Payer: No Typology Code available for payment source | Admitting: Registered Nurse

## 2023-04-16 ENCOUNTER — Encounter: Payer: Self-pay | Admitting: Registered Nurse

## 2023-04-16 VITALS — BP 140/67 | HR 89 | Temp 98.1°F | Resp 16

## 2023-04-16 DIAGNOSIS — S161XXA Strain of muscle, fascia and tendon at neck level, initial encounter: Secondary | ICD-10-CM

## 2023-04-16 DIAGNOSIS — S29019A Strain of muscle and tendon of unspecified wall of thorax, initial encounter: Secondary | ICD-10-CM

## 2023-04-16 DIAGNOSIS — J00 Acute nasopharyngitis [common cold]: Secondary | ICD-10-CM

## 2023-04-16 MED ORDER — BIOFREEZE PROFESSIONAL 5 % EX GEL: 1.0000 | Freq: Four times a day (QID) | CUTANEOUS | Status: AC | PRN

## 2023-04-16 NOTE — Progress Notes (Signed)
Subjective:    Patient ID: Christine Rojas, female    DOB: 08-15-1965, 57 y.o.   MRN: [id number]  57y/o married caucasian female established patient with neck/shoulder/back  pain.  Unsure if related to her allergies/congestion, season change temperatures or dog having surgery last week.  Having to carry 50lb dog as not weight bearing initially having to use sheet lift back legs off ground no weight bearing.  Denied loss of use/tingling/numbness arms/legs dropping items.  Ibuprofen this weekend helped.  Worst pain 7/10 now 1/10 neck muscles feel tight.  Busy at work also in [employer].  Denied loss of bowel/bladder control or saddle paresthesias.      Review of Systems  Constitutional:  Positive for activity change. Negative for appetite change, chills, diaphoresis, fatigue and fever.  HENT:  Positive for congestion and postnasal drip. Negative for trouble swallowing and voice change.   Eyes:  Positive for itching. Negative for photophobia and visual disturbance.  Respiratory:  Positive for wheezing. Negative for cough, choking, chest tightness, shortness of breath and stridor.   Cardiovascular:  Negative for chest pain.  Gastrointestinal:  Negative for diarrhea, nausea and vomiting.  Genitourinary:  Negative for difficulty urinating and enuresis.  Musculoskeletal:  Positive for back pain, myalgias and neck pain. Negative for gait problem and neck stiffness.  Allergic/Immunologic: Positive for environmental allergies.  Neurological:  Negative for dizziness, tremors, syncope, facial asymmetry, weakness, light-headedness, numbness and headaches.  Psychiatric/Behavioral:  Negative for agitation, confusion and sleep disturbance.        Objective:   Physical Exam Vitals reviewed.  Constitutional:      General: She is awake. She is not in acute distress.    Appearance: Normal appearance. She is well-developed and well-groomed. She is obese. She is not ill-appearing,  toxic-appearing or diaphoretic.  HENT:     Head: Normocephalic and atraumatic.     Jaw: There is normal jaw occlusion.     Salivary Glands: Right salivary gland is not diffusely enlarged. Left salivary gland is not diffusely enlarged.     Right Ear: Hearing, ear canal and external ear normal. No decreased hearing noted. No laceration, drainage, swelling or tenderness. A middle ear effusion is present. There is no impacted cerumen. No foreign body. No mastoid tenderness. No PE tube. No hemotympanum. Tympanic membrane is not injected, scarred, perforated, erythematous, retracted or bulging.     Left Ear: Hearing, ear canal and external ear normal. No decreased hearing noted. No laceration, drainage, swelling or tenderness. A middle ear effusion is present. There is no impacted cerumen. No foreign body. No mastoid tenderness. No PE tube. No hemotympanum. Tympanic membrane is not injected, scarred, perforated, erythematous, retracted or bulging.     Ears:     Comments: Bilateral TMs intact air fluid level clear no debris in auditory canals    Nose: Congestion present. No rhinorrhea.     Right Turbinates: Not enlarged, swollen or pale.     Left Turbinates: Not enlarged, swollen or pale.     Right Sinus: No maxillary sinus tenderness or frontal sinus tenderness.     Left Sinus: No maxillary sinus tenderness or frontal sinus tenderness.     Comments: Bilateral allergic shiners; cobblestoning posterior pharynx; nasal congestion audible in clinic and nasal sniffing observed    Mouth/Throat:     Lips: Pink. No lesions.     Mouth: Mucous membranes are moist.     Pharynx: Oropharynx is clear. No oropharyngeal exudate or posterior oropharyngeal erythema.  Eyes:     General: Lids are normal. Vision grossly intact. Gaze aligned appropriately. Allergic shiner present. No scleral icterus.       Right eye: No discharge.        Left eye: No discharge.     Extraocular Movements: Extraocular movements intact.      Conjunctiva/sclera: Conjunctivae normal.     Pupils: Pupils are equal, round, and reactive to light.  Neck:     Trachea: Trachea and phonation normal.     Comments: Bilateral trapezius tight and decreased rotation/lateral bending bilateral AROM due to pain; patient reports worst pain with left rotation Cardiovascular:     Rate and Rhythm: Normal rate and regular rhythm.     Pulses: Normal pulses.     Heart sounds: Normal heart sounds, S1 normal and S2 normal.  Pulmonary:     Effort: Pulmonary effort is normal. No respiratory distress.     Breath sounds: Normal breath sounds and air entry. No stridor or transmitted upper airway sounds. No wheezing, rhonchi or rales.     Comments: Spoke full sentences without difficulty; no cough observed in exam room Abdominal:     Palpations: Abdomen is soft.  Musculoskeletal:        General: Tenderness present. No swelling or deformity.     Right shoulder: Tenderness present. No swelling, deformity, effusion, laceration, bony tenderness or crepitus. Normal strength.     Left shoulder: No swelling, deformity, effusion, laceration, tenderness, bony tenderness or crepitus. Normal strength.     Right elbow: No swelling, deformity, effusion or lacerations. Normal range of motion.     Left elbow: No swelling, deformity, effusion or lacerations. Normal range of motion.     Right forearm: No swelling, edema, deformity, lacerations or tenderness.     Left forearm: No swelling, edema, deformity, lacerations or tenderness.     Right hand: Normal strength. Normal capillary refill.     Left hand: Normal strength. Normal capillary refill.     Cervical back: Neck supple. Tenderness present. No swelling, edema, deformity, erythema, signs of trauma, lacerations, rigidity, spasms, torticollis, bony tenderness or crepitus. Pain with movement and muscular tenderness present. No spinous process tenderness. Decreased range of motion.     Thoracic back: Tenderness present. No  swelling, edema, deformity, signs of trauma, lacerations, spasms or bony tenderness. Decreased range of motion.       Back:     Right lower leg: No edema.     Left lower leg: No edema.  Lymphadenopathy:     Head:     Right side of head: No submental, submandibular, tonsillar, preauricular, posterior auricular or occipital adenopathy.     Left side of head: No submental, submandibular, tonsillar, preauricular, posterior auricular or occipital adenopathy.     Cervical: No cervical adenopathy.     Right cervical: No superficial, deep or posterior cervical adenopathy.    Left cervical: No superficial, deep or posterior cervical adenopathy.  Skin:    General: Skin is warm and dry.     Capillary Refill: Capillary refill takes less than 2 seconds.     Coloration: Skin is not ashen, cyanotic, jaundiced, mottled, pale or sallow.     Findings: No abrasion, abscess, acne, bruising, burn, ecchymosis, erythema, signs of injury, laceration, lesion, petechiae, rash or wound.     Nails: There is no clubbing.  Neurological:     General: No focal deficit present.     Mental Status: She is alert and oriented to person, place, and  time. Mental status is at baseline.     GCS: GCS eye subscore is 4. GCS verbal subscore is 5. GCS motor subscore is 6.     Cranial Nerves: Cranial nerves 2-12 are intact. No cranial nerve deficit, dysarthria or facial asymmetry.     Sensory: Sensation is intact.     Motor: Motor function is intact. No weakness, tremor, atrophy, abnormal muscle tone or seizure activity.     Coordination: Coordination is intact. Coordination normal.     Gait: Gait is intact. Gait normal.     Comments: In/out of chair without difficulty; gait sure and steady in clinic; bilateral hand grasp equal 5/5  Psychiatric:        Attention and Perception: Attention and perception normal.        Mood and Affect: Mood and affect normal.        Speech: Speech normal.        Behavior: Behavior normal. Behavior  is cooperative.        Thought Content: Thought content normal.        Cognition and Memory: Cognition and memory normal.        Judgment: Judgment normal.           Assessment & Plan:  A-acute thoracic and cervical strain initial visit, acute rhinitis  P-Discussed most likely home injury from lifting dog would need restrictions from PCM/UC if unable to perform duties at work.  Discussed keeping dog close to her body using towel/sheet so she can stand upright instead of bent at waist when lifting.  Dog has follow up visit and hoping the weight bearing as tolerated advancement will be soon.  I am unable to write work restrictions in this clinic due to contract limitations.  biofreeze gel QID topical prn pain. Given 2 UD from clinic stock Home stretches demonstrated to patient-e.g. Arm circles, walking up wall, chest stretches, neck AROM, chin tucks, knee to chest and rock side to side on back. Self massage or professional prn, foam roller use or tennis/racquetball.  Heat/cryotherapy 15 minutes QID prn.  Trial thermacare 1 applied and another given to patient for use tomorrow from clinic stock as clinic closed tomorrow.  Consider physical therapy referral if no improvement with prescribed therapy from [facility] and/or chiropractic care.  Ensure ergonomics correct desk at work avoid repetitive motions if possible/holding phone/laptop in hand use desk/stand and/or break up lifting items into smaller loads/weights.  Patient was instructed to rest, ice, and ROM exercises.  Activity as tolerated.   Follow up if symptoms persist or worsen especially if loss of bowel/bladder control, arm/leg weakness and/or saddle paresthesias.  Exitcare handout on muscle strain neck/back and rehab exercises.  Patient verbalized agreement and understanding of treatment plan and had no further questions at this time.  P2:  Injury Prevention and Fitness.   Discussed with patient covid and other viral illnesses circulating in  community along with allergies flaring due to ragweed season causing runny nose/congestion.  Post nasal drip on exam and allergic shiners, lower eyelids nonpitting edema and bilateral otic effusions without infection/redness/tenderness.  Discussed with patient continue her allergic medication regimen antihistamine, nasal saline 2 sprays each nostril q2h prn congestion and showering prior to bed and not sleeping with windows open at this time.  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  Discussed if coughing can cause intercostal muscle pain.  Patient stated has been clearing throat. Also discussed that if  arthritis colder temperatures can cause increase in back pain also.  Discussed BBS CTA and sp02 stable 98% today on intermittent RA monitoring.  Discussed if worsening/new symptoms consider home covid testing.  Patient may use normal saline nasal spray 2 sprays each nostril q2h wa as needed. flonase 1 spray each nostril BID  Patient denied personal or family history of ENT cancer.  OTC antihistamine of choice allegra 180mg  po daily.  Avoid triggers if possible.  Shower prior to bedtime if exposed to triggers.  If allergic dust/dust mites recommend mattress/pillow covers/encasements; washing linens, vacuuming, sweeping, dusting weekly.  Call or return to clinic as needed if these symptoms worsen or fail to improve as anticipated.   Exitcare handout on allergic rhinitis, nonallergic rhinitis and sinus rinse.  Patient verbalized understanding of instructions, agreed with plan of care and had no further questions at this time.  P2:  Avoidance and hand washing.

## 2023-04-16 NOTE — Patient Instructions (Addendum)
Cervical Strain and Sprain Rehab Ask your health care provider which exercises are safe for you. Do exercises exactly as told by your health care provider and adjust them as directed. It is normal to feel mild stretching, pulling, tightness, or discomfort as you do these exercises. Stop right away if you feel sudden pain or your pain gets worse. Do not begin these exercises until told by your health care provider. Stretching and range-of-motion exercises Cervical side bending  Using good posture, sit on a stable chair or stand up. Without moving your shoulders, slowly tilt your left / right ear to your shoulder until you feel a stretch in the neck muscles on the opposite side. You should be looking straight ahead. Hold for _____30_____ seconds. Repeat with the other side of your neck. Repeat ____3______ times. Complete this exercise ___2_______ times a day. Cervical rotation  Using good posture, sit on a stable chair or stand up. Slowly turn your head to the side as if you are looking over your left / right shoulder. Keep your eyes level with the ground. Stop when you feel a stretch along the side and the back of your neck. Hold for _____30_____ seconds. Repeat this by turning to your other side. Repeat _____3_____ times. Complete this exercise ____2______ times a day. Thoracic extension and pectoral stretch  Roll a towel or a small blanket so it is about 4 inches (10 cm) in diameter. Lie down on your back on a firm surface. Put the towel in the middle of your back across your spine. It should not be under your shoulder blades. Put your hands behind your head and let your elbows fall out to your sides. Hold for ____30______ seconds. Repeat ____3______ times. Complete this exercise __2________ times a day. Strengthening exercises Upper cervical flexion  Lie on your back with a thin pillow behind your head or a small, rolled-up towel under your neck. Gently tuck your chin toward your  chest and nod your head down to look toward your feet. Do not lift your head off the pillow. Hold for _____30_____ seconds. Release the tension slowly. Relax your neck muscles completely before you repeat this exercise. Repeat _____3_____ times. Complete this exercise ____2______ times a day. Cervical extension  Stand about 6 inches (15 cm) away from a wall, with your back facing the wall. Place a soft object, about 6-8 inches (15-20 cm) in diameter, between the back of your head and the wall. A soft object could be a small pillow, a ball, or a folded towel. Gently tilt your head back and press into the soft object. Keep your jaw and forehead relaxed. Hold for _____30_____ seconds. Release the tension slowly. Relax your neck muscles completely before you repeat this exercise. Repeat _____3_____ times. Complete this exercise ___2_______ times a day. Posture and body mechanics Body mechanics refer to the movements and positions of your body while you do your daily activities. Posture is part of body mechanics. Good posture and healthy body mechanics can help to relieve stress in your body's tissues and joints. Good posture means that your spine is in its natural S-curve position (your spine is neutral), your shoulders are pulled back slightly, and your head is not tipped forward. The following are general guidelines for using improved posture and body mechanics in your everyday activities. Sitting  When sitting, keep your spine neutral and keep your feet flat on the floor. Use a footrest, if needed, and keep your thighs parallel to the floor. Avoid rounding  your shoulders. Avoid tilting your head forward. When working at a desk or a computer, keep your desk at a height where your hands are slightly lower than your elbows. Slide your chair under your desk so you are close enough to maintain good posture. When working at a computer, place your monitor at a height where you are looking straight ahead  and you do not have to tilt your head forward or downward to look at the screen. Standing  When standing, keep your spine neutral and keep your feet about hip-width apart. Keep a slight bend in your knees. Your ears, shoulders, and hips should line up. When you do a task in which you stand in one place for a long time, place one foot up on a stable object that is 2-4 inches (5-10 cm) high, such as a footstool. This helps keep your spine neutral. Resting When lying down and resting, avoid positions that are most painful for you. Try to support your neck in a neutral position. You can use a contour pillow or a small rolled-up towel. Your pillow should support your neck but not push on it. This information is not intended to replace advice given to you by your health care provider. Make sure you discuss any questions you have with your health care provider. Document Revised: 10/29/2022 Document Reviewed: 01/15/2022 Elsevier Patient Education  2024 [employer] Thoracic Strain Rehab Ask your health care provider which exercises are safe for you. Do exercises exactly as told by your provider and adjust them as directed. It is normal to feel mild stretching, pulling, tightness, or discomfort as you do these exercises. Stop right away if you feel sudden pain or your pain gets worse. Do not begin these exercises until told by your provider. Stretching and range-of-motion exercise This exercise warms up your muscles and joints and improves the movement and flexibility of your back and shoulders. This exercise also helps to relieve pain. Chest and spine stretch  Lie down on your back on a firm surface. Roll a towel or a small blanket so it is about 4 inches (10 cm) in diameter. Put the towel under the middle of your back so it is under your spine, but not under your shoulder blades. Put your hands behind your head and let your elbows fall to your sides. This will increase your stretch. Take a deep breath  (inhale). Hold for ____30______ seconds. Relax after you breathe out (exhale). Repeat _____3_____ times. Complete this exercise _____2_____ times a day. Strengthening exercises These exercises build strength and endurance in your back and your shoulder blade muscles. Endurance is the ability to use your muscles for a long time, even after they get tired. Alternating arm and leg raises  Get on your hands and knees on a firm surface. If you are on a hard floor, you may want to use padding, such as an exercise mat, to cushion your knees. Line up your arms and legs. Your hands should be directly below your shoulders, and your knees should be directly below your hips. Lift your left leg behind you. At the same time, raise your right arm and straighten it in front of you. Do not lift your leg higher than your hip. Do not lift your arm higher than your shoulder. Keep your abdominal and back muscles tight. Keep your hips facing the ground. Do not arch your back. Carefully stay balanced. Do not hold your breath. Hold for _____30_____ seconds. Slowly return to the starting position  and repeat with your right leg and your left arm. Repeat ____3______ times. Complete this exercise ___2_______ times a day. Straight arm rows This exercise is also called the shoulder extension exercise. Stand with your feet shoulder width apart. Secure an exercise band to a stable object in front of you so the band is at or above shoulder height. Hold one end of the exercise band in each hand. Straighten your elbows and lift your hands up to shoulder height. Step back, away from the secured end of the exercise band, until the band stretches. Squeeze your shoulder blades together and pull your hands down to the sides of your thighs. Stop when your hands are straight down by your sides. This is shoulder extension. Do not let your hands go behind your body. Hold for ____30______ seconds. Slowly return to the starting  position. Repeat _____3_____ times. Complete this exercise ____2______ times a day. Rowing scapular retraction This is an exercise in which the shoulder blades (scapulae) are pulled toward each other (retraction). Sit in a stable chair without armrests, or stand up. Secure an exercise band to a stable object in front of you so the band is at shoulder height. Hold one end of the exercise band in each hand. Your palms should face toward each other. Bring your arms out straight in front of you. Step back, away from the secured end of the exercise band, until the band stretches. Pull the band backward. As you do this, bend your elbows and squeeze your shoulder blades together, but avoid letting the rest of your body move. Do not shrug your shoulders upward while you do this. Stop when your elbows are at your sides or slightly behind your body. Hold for ____30______ seconds. Slowly straighten your arms to return to the starting position. Repeat _____3_____ times. Complete this exercise _____2_____ times a day. Posture and body mechanics Good posture and healthy body mechanics can help to relieve stress in your body's tissues and joints. Body mechanics refers to the movements and positions of your body while you do your daily activities. Posture is part of body mechanics. Good posture means: Your spine is in its natural S-curve position (neutral). Your shoulders are pulled back slightly. Your head is not tipped forward. Follow these guidelines to improve your posture and body mechanics in your everyday activities. Standing  When standing, keep your spine neutral and your feet about hip width apart. Keep a slight bend in your knees. Your ears, shoulders, and hips should line up with each other. When you do a task in which you lean forward while standing in one place for a long time, place one foot up on a stable object that is 2-4 inches (5-10 cm) high, such as a footstool. This helps keep your spine  neutral. Sitting  When sitting, keep your spine neutral and keep your feet flat on the floor. Use a footrest if needed. Keep your thighs parallel to the floor. Avoid rounding your shoulders, and avoid tilting your head forward. When working at a desk or a computer, keep your desk at a height where your hands are slightly lower than your elbows. Slide your chair under your desk so you are close enough to maintain good posture. When working at a computer, place your monitor at a height where you are looking straight ahead and you do not have to tilt your head forward or downward to look at the screen. Resting When lying down and resting, avoid positions that are most painful  for you. If you have pain with activities such as sitting, bending, stooping, or squatting (flexion-basedactivities), lie in a position in which your body does not bend very much. For example, avoid curling up on your side with your arms and knees near your chest (fetal position). If you have pain with activities such as standing for a long time or reaching with your arms (extension-basedactivities), lie with your spine in a neutral position and bend your knees slightly. Try the following positions: Lie on your side with a pillow between your knees. Lie on your back with a pillow under your knees.  Lifting  When lifting objects, keep your feet at least shoulder width apart and tighten your abdominal muscles. Bend your knees and hips and keep your spine neutral. It is important to lift using the strength of your legs, not your back. Do not lock your knees straight out. Always ask for help to lift heavy or awkward objects. This information is not intended to replace advice given to you by your health care provider. Make sure you discuss any questions you have with your health care provider. Document Revised: 02/12/2022 Document Reviewed: 02/12/2022 Elsevier Patient Education  2024 [employer] Thoracic Strain A thoracic  strain is an injury to the muscles or tendons that attach to the upper part of your back behind your chest. Tendons are tissues that connect muscle to bone. This injury is sometimes called a mid-back strain. It happens when a muscle is stretched too far or overloaded. Thoracic strains can range from mild to severe. Mild strains may involve stretching a muscle or tendon without tearing it. These injuries may heal in 1-2 weeks. More severe strains involve tearing of muscle fibers or tendons. These will cause more pain and may take 6-8 weeks to heal. What are the causes? This condition may be caused by: Trauma, such as a fall or a hit to the body. Twisting or overstretching the back. This may result from doing activities that take a lot of energy, such as lifting heavy objects. In some cases, the cause may not be known. What increases the risk? This injury is more common in: Athletes. People with obesity. What are the signs or symptoms? The main symptom of this condition is pain in the middle back, especially with movement. Other symptoms include: Stiffness or limited range of motion. Sudden muscle tightening (spasms). How is this diagnosed? This condition may be diagnosed based on: Your symptoms. Your medical history. A physical exam. Imaging tests, such as X-rays, a CT scan, or an MRI. How is this treated? This condition may be treated with: Resting the injured area. Applying heat and cold to the injured area. Over-the-counter medicines for pain and inflammation, such as NSAIDs. Prescription pain medicine or muscle relaxants. These may be needed for a short time. Doing exercises to improve movement and strength in your back (physical therapy). Treatments applied to the painful area, such as: Electrical stimulation. Stimulating the muscle with small needles (dry needling). Injections of medicine (trigger point injections). Follow these instructions at home: Managing pain, stiffness,  and swelling     If told, put ice on the injured area. Put ice in a plastic bag. Place a towel between your skin and the bag. Leave the ice on for 20 minutes, 2-3 times a day. If told, apply heat to the affected area as often as told by your health care provider. Use the heat source that your provider recommends, such as a moist heat pack or a  heating pad. Place a towel between your skin and the heat source. Leave the heat on for 20-30 minutes. If your skin turns bright red, remove the ice or heat right away to prevent skin damage. The risk of damage is higher if you cannot feel pain, heat, or cold. Activity Rest and return to your normal activities as told by your provider. Ask your provider what activities are safe for you. Do exercises as told by your provider. Medicines Take over-the-counter and prescription medicines only as told by your provider. Ask your provider if the medicine prescribed to you: Requires you to avoid driving or using machinery. Can cause constipation. You may need to take these actions to prevent or treat constipation: Drink enough fluid to keep your pee (urine) pale yellow. Take over-the-counter or prescription medicines. Eat foods that are high in fiber, such as beans, whole grains, and fresh fruits and vegetables. Limit foods that are high in fat and processed sugars, such as fried or sweet foods. General instructions Do not use any products that contain nicotine or tobacco. These products include cigarettes, chewing tobacco, and vaping devices, such as e-cigarettes. If you need help quitting, ask your provider. Keep all follow-up visits. Your provider will monitor your injury and activity. How is this prevented? To prevent a future mid-back injury: Always warm up before physical activity or sports. Cool down and stretch after being active. Use correct form when playing sports and lifting heavy objects. Bend your knees before you lift heavy objects. Use  good posture when sitting and standing. Stay physically fit and maintain a healthy weight. Do at least 150 minutes of moderate-intensity exercise each week, such as brisk walking or water aerobics. Do strength exercises at least 2 times each week. Contact a health care provider if: Your pain is not helped by medicine. Your pain or stiffness is getting worse. You develop pain or stiffness in your neck or lower back. Get help right away if: You have shortness of breath. You have chest pain. You have numbness, tingling, or weakness in your legs. You are not able to control when you pee (urinary incontinence). These symptoms may be an emergency. Get help right away. Call 911. Do not wait to see if the symptoms will go away. Do not drive yourself to the hospital. This information is not intended to replace advice given to you by your health care provider. Make sure you discuss any questions you have with your health care provider. Document Revised: 10/29/2022 Document Reviewed: 02/12/2022 Elsevier Patient Education  2024 [employer].

## 2023-04-30 ENCOUNTER — Other Ambulatory Visit (HOSPITAL_COMMUNITY): Payer: Self-pay

## 2023-05-02 ENCOUNTER — Encounter: Payer: Self-pay | Admitting: Registered Nurse

## 2023-05-02 ENCOUNTER — Telehealth: Payer: Self-pay | Admitting: Registered Nurse

## 2023-05-02 DIAGNOSIS — E7800 Pure hypercholesterolemia, unspecified: Secondary | ICD-10-CM

## 2023-05-02 MED ORDER — ATORVASTATIN CALCIUM 40 MG PO TABS: 40.0000 mg | ORAL_TABLET | Freq: Every day | ORAL | 0 refills | Status: DC

## 2023-05-02 NOTE — Telephone Encounter (Signed)
[facility] shipment received dispensed 90 tabs atorvastatin 40mg  po daily to patient today  last fill 01/29/23 labs stable next due Dec 2024 with [facility]   Latest Reference Range & Units 06/19/22 08:40 09/27/22 08:30 09/27/22 12:20 10/01/22 08:25  Sodium 134 - 144 mmol/L 138     Potassium 3.5 - 5.2 mmol/L 4.2     Chloride 96 - 106 mmol/L 99     Glucose 70 - 99 mg/dL 99     BUN 6 - 24 mg/dL 19     Creatinine 7.32 - 1.00 mg/dL 2.02     Calcium 8.7 - 10.2 mg/dL 9.6     BUN/Creatinine Ratio 9 - 23  22     eGFR >59 mL/min/1.73 80     Phosphorus 3.0 - 4.3 mg/dL 4.5 (H)     Magnesium 1.6 - 2.3 mg/dL 1.9     Alkaline Phosphatase 44 - 121 IU/L 78     Albumin 3.8 - 4.9 g/dL 4.4     Albumin/Globulin Ratio 1.2 - 2.2  2.0     Uric Acid 3.0 - 7.2 mg/dL 7.1   6.5  AST 0 - 40 IU/L 17     ALT 0 - 32 IU/L 31     Total Protein 6.0 - 8.5 g/dL 6.6     Total Bilirubin 0.0 - 1.2 mg/dL 0.3     GGT 0 - 60 IU/L 35     Estimated CHD Risk 0.0 - 1.0 times avg.  < 0.5     LDH 119 - 226 IU/L 142     Total CHOL/HDL Ratio 0.0 - 4.4 ratio 2.5     Cholesterol, Total 100 - 199 mg/dL 542     HDL Cholesterol >39 mg/dL 60     Triglycerides 0 - 149 mg/dL 94     VLDL Cholesterol Cal 5 - 40 mg/dL 17     LDL Chol Calc (NIH) 0 - 99 mg/dL 72     Iron 27 - 706 ug/dL 64     Vitamin D, 23-JSEGBTD 30.0 - 100.0 ng/mL 47.0     Globulin, Total 1.5 - 4.5 g/dL 2.2     WBC 3.4 - 17.6 x10E3/uL 6.7 6.3    RBC 3.77 - 5.28 x10E6/uL 5.37 (H) 5.29 (H)    Hemoglobin 11.1 - 15.9 g/dL 16.0 73.7    HCT 10.6 - 46.6 % 45.8 47.1 (H)    MCV 79 - 97 fL 85 89    MCH 26.6 - 33.0 pg 28.3 28.7    MCHC 31.5 - 35.7 g/dL 26.9 48.5    RDW 46.2 - 15.4 % 12.7 12.7    Platelets 150 - 450 x10E3/uL 412 369    Neutrophils Not Estab. % 64 64    Immature Granulocytes Not Estab. % 0 0    NEUT# 1.4 - 7.0 x10E3/uL 4.3 4.0    Lymphs Abs 0.7 - 3.1 x10E3/uL 1.7 1.6    Monocytes Absolute 0.1 - 0.9 x10E3/uL 0.4 0.5    Basophils Absolute 0.0 - 0.2 x10E3/uL 0.0 0.0     Immature Grans (Abs) 0.0 - 0.1 x10E3/uL 0.0 0.0    Lymphs Not Estab. % 26 26    Monocytes Not Estab. % 6 7    Basos Not Estab. % 1 0    Eos Not Estab. % 3 3    EOS (ABSOLUTE) 0.0 - 0.4 x10E3/uL 0.2 0.2    POC Glucose 70 - 99 mg/dl   703 !   Hemoglobin A1C  4.8 - 5.6 % 6.0 (H) 6.0 (H)    Est. average glucose Bld gHb Est-mCnc mg/dL 161 096    TSH 0.454 - 4.500 uIU/mL 0.932     Thyroxine (T4) 4.5 - 12.0 ug/dL 8.9     Free Thyroxine Index 1.2 - 4.9  2.5     T3 Uptake Ratio 24 - 39 % 28     (H): Data is abnormally high !: Data is abnormal

## 2023-05-10 ENCOUNTER — Other Ambulatory Visit (HOSPITAL_BASED_OUTPATIENT_CLINIC_OR_DEPARTMENT_OTHER): Payer: Self-pay

## 2023-05-10 ENCOUNTER — Other Ambulatory Visit (HOSPITAL_COMMUNITY): Payer: Self-pay

## 2023-05-10 MED ORDER — OZEMPIC (1 MG/DOSE) 4 MG/3ML ~~LOC~~ SOPN
1.0000 mg | PEN_INJECTOR | SUBCUTANEOUS | 0 refills | Status: DC
Start: 2023-05-10 — End: 2023-07-24
  Filled 2023-05-10 – 2023-06-03 (×2): qty 9, 84d supply, fill #0

## 2023-05-13 ENCOUNTER — Other Ambulatory Visit (HOSPITAL_COMMUNITY): Payer: Self-pay

## 2023-06-03 ENCOUNTER — Other Ambulatory Visit: Payer: Self-pay

## 2023-07-16 ENCOUNTER — Other Ambulatory Visit: Payer: No Typology Code available for payment source

## 2023-07-18 ENCOUNTER — Other Ambulatory Visit: Payer: Self-pay | Admitting: Registered Nurse

## 2023-07-18 ENCOUNTER — Other Ambulatory Visit: Payer: No Typology Code available for payment source

## 2023-07-18 NOTE — Progress Notes (Signed)
 EE in clinic this morning for fasting blood work.  Blood drawn from left antecubital and procedure tolerated well by employee.  Reece Packer, BSN, MPH, COHN-S

## 2023-07-19 LAB — CMP12+LP+TP+TSH+6AC+CBC/D/PLT
ALT: 36 [IU]/L — ABNORMAL HIGH (ref 0–32)
AST: 20 [IU]/L (ref 0–40)
Albumin: 4.5 g/dL (ref 3.8–4.9)
Alkaline Phosphatase: 82 [IU]/L (ref 44–121)
BUN/Creatinine Ratio: 26 — ABNORMAL HIGH (ref 9–23)
BUN: 21 mg/dL (ref 6–24)
Basophils Absolute: 0 10*3/uL (ref 0.0–0.2)
Basos: 1 %
Bilirubin Total: 0.4 mg/dL (ref 0.0–1.2)
Calcium: 9.3 mg/dL (ref 8.7–10.2)
Chloride: 99 mmol/L (ref 96–106)
Chol/HDL Ratio: 2.8 {ratio} (ref 0.0–4.4)
Cholesterol, Total: 170 mg/dL (ref 100–199)
Creatinine, Ser: 0.82 mg/dL (ref 0.57–1.00)
EOS (ABSOLUTE): 0.3 10*3/uL (ref 0.0–0.4)
Eos: 5 %
Estimated CHD Risk: <0.5 times avg. (ref 0.0–1.0)
Free Thyroxine Index: 2.7 (ref 1.2–4.9)
GGT: 38 [IU]/L (ref 0–60)
Globulin, Total: 2.3 g/dL (ref 1.5–4.5)
Glucose: 124 mg/dL — ABNORMAL HIGH (ref 70–99)
HDL: 61 mg/dL (ref >39)
Hematocrit: 45.5 % (ref 34.0–46.6)
Hemoglobin: 15.5 g/dL (ref 11.1–15.9)
Immature Grans (Abs): 0 10*3/uL (ref 0.0–0.1)
Immature Granulocytes: 0 %
Iron: 73 ug/dL (ref 27–159)
LDH: 151 [IU]/L (ref 119–226)
LDL Chol Calc (NIH): 89 mg/dL (ref 0–99)
Lymphocytes Absolute: 1.7 10*3/uL (ref 0.7–3.1)
Lymphs: 26 %
MCH: 29.9 pg (ref 26.6–33.0)
MCHC: 34.1 g/dL (ref 31.5–35.7)
MCV: 88 fL (ref 79–97)
Monocytes Absolute: 0.5 10*3/uL (ref 0.1–0.9)
Monocytes: 7 %
Neutrophils Absolute: 4 10*3/uL (ref 1.4–7.0)
Neutrophils: 61 %
Phosphorus: 4.2 mg/dL (ref 3.0–4.3)
Platelets: 388 10*3/uL (ref 150–450)
Potassium: 4.9 mmol/L (ref 3.5–5.2)
RBC: 5.19 x10E6/uL (ref 3.77–5.28)
RDW: 12.3 % (ref 11.7–15.4)
Sodium: 138 mmol/L (ref 134–144)
T3 Uptake Ratio: 28 % (ref 24–39)
T4, Total: 9.6 ug/dL (ref 4.5–12.0)
TSH: 1.01 u[IU]/mL (ref 0.450–4.500)
Total Protein: 6.8 g/dL (ref 6.0–8.5)
Triglycerides: 115 mg/dL (ref 0–149)
Uric Acid: 7.8 mg/dL — ABNORMAL HIGH (ref 3.0–7.2)
VLDL Cholesterol Cal: 20 mg/dL (ref 5–40)
WBC: 6.5 10*3/uL (ref 3.4–10.8)
eGFR: 83 mL/min/{1.73_m2} (ref >59)

## 2023-07-20 ENCOUNTER — Encounter: Payer: Self-pay | Admitting: Registered Nurse

## 2023-07-23 ENCOUNTER — Other Ambulatory Visit: Payer: Self-pay | Admitting: Family Medicine

## 2023-07-23 DIAGNOSIS — Z1231 Encounter for screening mammogram for malignant neoplasm of breast: Secondary | ICD-10-CM

## 2023-07-23 LAB — HGB A1C W/O EAG: Hgb A1c MFr Bld: 6.5 % — ABNORMAL HIGH (ref 4.8–5.6)

## 2023-07-23 LAB — SPECIMEN STATUS REPORT

## 2023-07-24 ENCOUNTER — Other Ambulatory Visit (HOSPITAL_COMMUNITY): Payer: Self-pay

## 2023-07-24 MED ORDER — OZEMPIC (1 MG/DOSE) 4 MG/3ML ~~LOC~~ SOPN
1.0000 mg | PEN_INJECTOR | SUBCUTANEOUS | 1 refills | Status: DC
  Filled 2023-07-24 – 2023-08-16 (×10): qty 9, 84d supply, fill #0
  Filled 2023-08-19 – 2023-10-31 (×3): qty 9, 84d supply, fill #1

## 2023-07-25 ENCOUNTER — Other Ambulatory Visit: Payer: Self-pay

## 2023-07-26 ENCOUNTER — Other Ambulatory Visit (HOSPITAL_COMMUNITY): Payer: Self-pay

## 2023-07-27 ENCOUNTER — Other Ambulatory Visit (HOSPITAL_COMMUNITY): Payer: Self-pay

## 2023-07-29 ENCOUNTER — Other Ambulatory Visit (HOSPITAL_COMMUNITY): Payer: Self-pay

## 2023-07-29 ENCOUNTER — Other Ambulatory Visit: Payer: Self-pay

## 2023-07-30 ENCOUNTER — Other Ambulatory Visit (HOSPITAL_COMMUNITY): Payer: Self-pay

## 2023-07-31 ENCOUNTER — Other Ambulatory Visit (HOSPITAL_COMMUNITY): Payer: Self-pay

## 2023-08-01 ENCOUNTER — Other Ambulatory Visit: Payer: Self-pay

## 2023-08-02 ENCOUNTER — Other Ambulatory Visit (HOSPITAL_COMMUNITY): Payer: Self-pay

## 2023-08-02 ENCOUNTER — Other Ambulatory Visit: Payer: Self-pay

## 2023-08-06 ENCOUNTER — Ambulatory Visit
Admission: RE | Admit: 2023-08-06 | Discharge: 2023-08-06 | Disposition: A | Discharge status: Home / Self Care | Payer: No Typology Code available for payment source | Source: Ambulatory Visit | Attending: Family Medicine | Admitting: Family Medicine

## 2023-08-06 DIAGNOSIS — Z1231 Encounter for screening mammogram for malignant neoplasm of breast: Secondary | ICD-10-CM

## 2023-08-16 ENCOUNTER — Other Ambulatory Visit (HOSPITAL_COMMUNITY): Payer: Self-pay

## 2023-08-17 ENCOUNTER — Other Ambulatory Visit (HOSPITAL_COMMUNITY): Payer: Self-pay

## 2023-08-19 ENCOUNTER — Other Ambulatory Visit (HOSPITAL_COMMUNITY): Payer: Self-pay

## 2023-08-21 ENCOUNTER — Other Ambulatory Visit (HOSPITAL_COMMUNITY): Payer: Self-pay

## 2023-08-22 ENCOUNTER — Other Ambulatory Visit (HOSPITAL_COMMUNITY): Payer: Self-pay

## 2023-09-14 ENCOUNTER — Telehealth: Payer: Self-pay | Admitting: Registered Nurse

## 2023-09-14 DIAGNOSIS — Z0000 Encounter for general adult medical examination without abnormal findings: Secondary | ICD-10-CM

## 2023-09-14 NOTE — Telephone Encounter (Signed)
 Patient had [facility] labs completed 07/18/2023 Hgba1c 6.5 LDL 89.  Needs BP and weight with RN Olegario Messier and to complete paperwork prior to 06 Jan 2024 to finish requirements for insurance discount starting 08 Apr 2024.  Message left for patient.

## 2023-09-16 ENCOUNTER — Ambulatory Visit

## 2023-09-16 NOTE — Progress Notes (Signed)
 BP check and weight per NP.

## 2023-09-23 ENCOUNTER — Other Ambulatory Visit (HOSPITAL_COMMUNITY)
Admission: RE | Admit: 2023-09-23 | Discharge: 2023-09-23 | Disposition: A | Discharge status: Home / Self Care | Source: Ambulatory Visit | Attending: Nurse Practitioner | Admitting: Nurse Practitioner

## 2023-09-23 ENCOUNTER — Other Ambulatory Visit: Payer: Self-pay

## 2023-09-23 ENCOUNTER — Other Ambulatory Visit (HOSPITAL_COMMUNITY): Payer: Self-pay

## 2023-09-23 ENCOUNTER — Other Ambulatory Visit: Payer: Self-pay | Admitting: Nurse Practitioner

## 2023-09-23 DIAGNOSIS — Z124 Encounter for screening for malignant neoplasm of cervix: Secondary | ICD-10-CM | POA: Diagnosis present

## 2023-09-23 MED ORDER — PROGESTERONE MICRONIZED 100 MG PO CAPS
100.0000 mg | ORAL_CAPSULE | Freq: Every evening | ORAL | 0 refills | Status: DC
  Filled 2023-09-23 (×2): qty 90, 90d supply, fill #0

## 2023-09-23 MED ORDER — ESTRADIOL 0.025 MG/24HR TD PTTW
1.0000 | MEDICATED_PATCH | TRANSDERMAL | 2 refills | Status: DC
Start: 2023-09-23 — End: 2024-05-06
  Filled 2023-09-23 (×2): qty 8, 28d supply, fill #0
  Filled 2023-10-15: qty 8, 28d supply, fill #1

## 2023-09-24 ENCOUNTER — Other Ambulatory Visit (HOSPITAL_COMMUNITY): Payer: Self-pay

## 2023-09-25 LAB — CYTOLOGY - PAP
Comment: NEGATIVE
Diagnosis: NEGATIVE
High risk HPV: NEGATIVE

## 2023-10-15 ENCOUNTER — Other Ambulatory Visit (HOSPITAL_COMMUNITY): Payer: Self-pay

## 2023-10-29 ENCOUNTER — Other Ambulatory Visit: Payer: Self-pay

## 2023-10-29 ENCOUNTER — Other Ambulatory Visit (HOSPITAL_COMMUNITY): Payer: Self-pay

## 2023-10-29 ENCOUNTER — Encounter: Payer: Self-pay | Admitting: Registered Nurse

## 2023-10-29 ENCOUNTER — Telehealth: Payer: Self-pay | Admitting: Registered Nurse

## 2023-10-29 DIAGNOSIS — K219 Gastro-esophageal reflux disease without esophagitis: Secondary | ICD-10-CM

## 2023-10-29 DIAGNOSIS — E7800 Pure hypercholesterolemia, unspecified: Secondary | ICD-10-CM

## 2023-10-29 DIAGNOSIS — E1169 Type 2 diabetes mellitus with other specified complication: Secondary | ICD-10-CM

## 2023-10-29 DIAGNOSIS — I10 Essential (primary) hypertension: Secondary | ICD-10-CM

## 2023-10-29 MED ORDER — VEOZAH 45 MG PO TABS
45.0000 mg | ORAL_TABLET | Freq: Every day | ORAL | 1 refills | Status: DC
  Filled 2023-10-29 – 2023-11-21 (×2): qty 30, 30d supply, fill #0

## 2023-10-29 NOTE — Telephone Encounter (Signed)
 Patient reminded to see RN Thersia Flax to complete Be Well requirements sign forms/height/weight/BP 10/29/2023

## 2023-10-29 NOTE — Telephone Encounter (Signed)
 Comp Metabolic Panel Reviewed date:10/29/2023 10:50:17 AM Interpretation: Performing Lab: Notes/Report: Testing Performed at: [facility], [address], [address], [place], [place] [address]  Glucose 146 70-99 mg/dL    BUN 21 6-04 mg/dL    Creatinine 5.40 9.81-1.91 mg/dl    [id number] >13 calc In accordance with recommendations from NKF-ASN Task Force, [facility] has updated its eGFR calc to the 2021 CKD-EDI equation that estimates kidney function without a race variable;Stage 1 > 90 ML/Min plus Albuminuria;Stage 2 60-89 ML/MIN;Stage 3 30-59 ML/MIN;Stage 4 15-29 ML/MIN;Stage 5 <15 ML/MIN  Sodium 137 136-145 mmol/L    Potassium 4.4 3.5-5.5 mmol/L    Chloride 100 98-107 mmol/L    CO2 27 22-32 mmol/L    Anion Gap 14.7 6.0-20.0 mmol/L    Calcium  9.7 8.6-10.3 mg/dL    CA-corrected 0.86 5.78-46.96 mg/dL    Protein, Total 6.9 2.9-5.2 g/dL    Albumin 4.5 8.4-1.3 g/dL    TBIL 0.3 2.4-4.0 mg/dL    ALP 73 10-272 U/L    AST 19 0-39 U/L    ALT 34 0-52 U/L    Patient last filled 07/25/2023 90 day supply each. Last PCM appt 07/23/2023  BP 116/66 today last LDL 89 Hgba1c 6.5.    Next labs due Jan 2026.  Patient denied concerns or further questions.dispensed 90 day supply each omeprazole  DR 40mg ; lisinopril  10mg , hydrochlorothiazide  25mg , atorvastatin  40mg  #90 and metformin  1000mg  #180 to patient today from [facility].

## 2023-10-30 ENCOUNTER — Other Ambulatory Visit (HOSPITAL_COMMUNITY): Payer: Self-pay

## 2023-10-30 ENCOUNTER — Other Ambulatory Visit: Payer: Self-pay

## 2023-10-31 MED ORDER — OMEPRAZOLE 40 MG PO CPDR: 40.0000 mg | DELAYED_RELEASE_CAPSULE | Freq: Every day | ORAL | Status: DC

## 2023-10-31 MED ORDER — SALINE SPRAY 0.65 % NA SOLN: 2.0000 | NASAL | Status: DC

## 2023-10-31 MED ORDER — METFORMIN HCL 1000 MG PO TABS: 1000.0000 mg | ORAL_TABLET | Freq: Every day | ORAL | 2 refills | Status: DC

## 2023-10-31 MED ORDER — ATORVASTATIN CALCIUM 40 MG PO TABS: 40.0000 mg | ORAL_TABLET | Freq: Every day | ORAL | 3 refills | Status: DC

## 2023-10-31 MED ORDER — HYDROCHLOROTHIAZIDE 25 MG PO TABS: 25.0000 mg | ORAL_TABLET | Freq: Every day | ORAL | 3 refills | Status: DC

## 2023-10-31 MED ORDER — LISINOPRIL 10 MG PO TABS: 10.0000 mg | ORAL_TABLET | Freq: Every day | ORAL | 3 refills | Status: DC

## 2023-10-31 NOTE — Telephone Encounter (Signed)
 Patient signed Be Well 2026 ROI and tobacco attestation 10/29/23 BP 116/66 weight 224lbs BMI 36 last year 131/77 weight 207lbs BMI 33.  NP signed Be Well forms and completed [facility] form given to HR Tonya notified met requirements for insurance discount starting 08 Apr 2024 nonsmoker/vaper in previous 12 months.

## 2023-11-01 ENCOUNTER — Other Ambulatory Visit (HOSPITAL_COMMUNITY): Payer: Self-pay

## 2023-11-19 ENCOUNTER — Ambulatory Visit: Payer: Self-pay | Admitting: Registered Nurse

## 2023-11-27 ENCOUNTER — Other Ambulatory Visit: Payer: Self-pay

## 2023-11-27 ENCOUNTER — Other Ambulatory Visit (HOSPITAL_COMMUNITY): Payer: Self-pay

## 2023-11-27 MED ORDER — VEOZAH 45 MG PO TABS
45.0000 mg | ORAL_TABLET | Freq: Every day | ORAL | 0 refills | Status: DC
Start: 2023-11-27 — End: 2023-12-26
  Filled 2023-12-21: qty 30, 30d supply, fill #0

## 2023-12-10 ENCOUNTER — Encounter: Payer: Self-pay | Admitting: Registered Nurse

## 2023-12-10 ENCOUNTER — Ambulatory Visit: Payer: Self-pay | Admitting: Registered Nurse

## 2023-12-10 ENCOUNTER — Ambulatory Visit: Admitting: Registered Nurse

## 2023-12-10 VITALS — BP 141/98 | HR 87 | Temp 97.5°F

## 2023-12-10 DIAGNOSIS — R42 Dizziness and giddiness: Secondary | ICD-10-CM

## 2023-12-10 DIAGNOSIS — E119 Type 2 diabetes mellitus without complications: Secondary | ICD-10-CM

## 2023-12-10 LAB — GLUCOSE, POCT (MANUAL RESULT ENTRY): POC Glucose: 92 mg/dL (ref 70–99)

## 2023-12-10 NOTE — Patient Instructions (Signed)
 Benign Positional Vertigo Vertigo is the feeling that you or your surroundings are moving when they are not. Benign positional vertigo is the most common form of vertigo. This is usually a harmless condition (benign). This condition is positional. This means that symptoms are triggered by certain movements and positions. This condition can be dangerous if it occurs while you are doing something that could cause harm to yourself or others. This includes activities such as driving or operating machinery. What are the causes? The inner ear has fluid-filled canals that help your brain sense movement and balance. When the fluid moves, the brain receives messages about your body's position. With benign positional vertigo, calcium  crystals in the inner ear break free and disturb the inner ear area. This causes your brain to receive confusing messages about your body's position. What increases the risk? You are more likely to develop this condition if: You are a woman. You are 67 years of age or older. You have recently had a head injury. You have an inner ear disease. What are the signs or symptoms? Symptoms of this condition usually happen when you move your head or your eyes in different directions. Symptoms may start suddenly and usually last for less than a minute. They include: Loss of balance and falling. Feeling like you are spinning or moving. Feeling like your surroundings are spinning or moving. Nausea and vomiting. Blurred vision. Dizziness. Involuntary eye movement (nystagmus). Symptoms can be mild and cause only minor problems, or they can be severe and interfere with daily life. Episodes of benign positional vertigo may return (recur) over time. Symptoms may also improve over time. How is this diagnosed? This condition may be diagnosed based on: Your medical history. A physical exam of the head, neck, and ears. Positional tests to check for or stimulate vertigo. You may be asked to  turn your head and change positions, such as going from sitting to lying down. A health care provider will watch for symptoms of vertigo. You may be referred to a health care provider who specializes in ear, nose, and throat problems (ENT or otolaryngologist) or a provider who specializes in disorders of the nervous system (neurologist). How is this treated?  This condition may be treated in a session in which your health care provider moves your head in specific positions to help the displaced crystals in your inner ear move. Treatment for this condition may take several sessions. Surgery may be needed in severe cases, but this is rare. In some cases, benign positional vertigo may resolve on its own in 2-4 weeks. Follow these instructions at home: Safety Move slowly. Avoid sudden body or head movements or certain positions, as told by your health care provider. Avoid driving or operating machinery until your health care provider says it is safe. Avoid doing any tasks that would be dangerous to you or others if vertigo occurs. If you have trouble walking or keeping your balance, try using a cane for stability. If you feel dizzy or unstable, sit down right away. Return to your normal activities as told by your health care provider. Ask your health care provider what activities are safe for you. General instructions Take over-the-counter and prescription medicines only as told by your health care provider. Drink enough fluid to keep your urine pale yellow. Keep all follow-up visits. This is important. Contact a health care provider if: You have a fever. Your condition gets worse or you develop new symptoms. Your family or friends notice any behavioral changes.  You have nausea or vomiting that gets worse. You have numbness or a prickling and tingling sensation. Get help right away if you: Have difficulty speaking or moving. Are always dizzy or faint. Develop severe headaches. Have weakness in  your legs or arms. Have changes in your hearing or vision. Develop a stiff neck. Develop sensitivity to light. These symptoms may represent a serious problem that is an emergency. Do not wait to see if the symptoms will go away. Get medical help right away. Call your local emergency services (911 in the [place] Do not drive yourself to the hospital. Summary Vertigo is the feeling that you or your surroundings are moving when they are not. Benign positional vertigo is the most common form of vertigo. This condition is caused by calcium  crystals in the inner ear that become displaced. This causes a disturbance in an area of the inner ear that helps your brain sense movement and balance. Symptoms include loss of balance and falling, feeling that you or your surroundings are moving, nausea and vomiting, and blurred vision. This condition can be diagnosed based on symptoms, a physical exam, and positional tests. Follow safety instructions as told by your health care provider and keep all follow-up visits. This is important. This information is not intended to replace advice given to you by your health care provider. Make sure you discuss any questions you have with your health care provider. Document Revised: 01/14/2023 Document Reviewed: 01/14/2023 Elsevier Patient Education  2024 [employer]Menopause: What to Know Menopause is the time in your life when your menstrual periods stop. It marks the end of your ability to get pregnant. It can be defined as not having a period for 12 months without another medical cause. The time when you start to move into menopause is called perimenopause. It often happens between ages 34-55. It can last for many years. During perimenopause, hormone levels change in your body. This can cause symptoms and affect your health. Menopause may make you more likely to have: Bones that are weak and break more easily. Depression. This is when you feel sad or hopeless. Arteries  that harden and get narrow. These can cause heart attacks and strokes. What are the causes? In most cases, menopause is a natural change to your body and hormone levels that happens as you get older. But in some cases, it may be caused by changes that aren't natural. These include: Surgery to take out both ovaries. Side effects from some medicines. What increases the risk? You're more likely to go through menopause early if: You have an abnormal growth (tumor) of the pituitary gland in your brain. You have a disease that affects your ovaries. You've had certain treatments for cancer. These include: Chemotherapy. Hormone therapy. Radiation therapy on the area between your hips (pelvis). You smoke a lot or drink a lot of alcohol. Other people in your family have gone through menopause early. You're very thin. What are the signs or symptoms? You may have: Hot flashes. Irregular periods. Night sweats. Changes in how you feel about sex. You may: Have less of a sex drive. Feel more discomfort around your sexuality. Vaginal dryness and thinning of the vaginal walls. This may make it hurt to have sex. Skin changes, such as: Dry skin. New wrinkles. Headaches. Other symptoms may include: Trouble sleeping. Mood swings. Memory problems. Weight gain. Hair growth on your face and chest. Bladder infections or trouble peeing. How is this diagnosed? You may be diagnosed based on: Your medical  history. An exam. Your age. Your history of menstrual periods. Your symptoms. Hormone tests. How is this treated? In some cases, no treatment is needed. Talk with your health care provider about if you should get treated. Treatments may include: Menopausal hormone therapy (MHT). Medicines to treat certain symptoms. Acupuncture. Vitamin or herbal supplements. Before you start treatment, let your provider know if you or anyone in your family has or has had: Heart disease. Breast cancer. Blood  clots. Diabetes. Osteoporosis. Follow these instructions at home: Eating and drinking  Eat a balanced diet. It should include: Fresh fruits and vegetables. Whole grains. Lean protein. Low-fat dairy. Eat lots of foods that have calcium  and vitamin D  in them. These can help keep your bones healthy. Foods and drinks that are rich in calcium  include: Yogurt and low-fat milk. Beans. Almonds. Sardines. Broccoli and kale. To help prevent hot flashes, stay away from: Alcohol. Drinks with caffeine in them. Spicy foods. Lifestyle Do not smoke, vape, or use nicotine or tobacco. Get 7-8 hours of sleep each night. If you have hot flashes, you may want to: Dress in layers. Avoid things that may trigger hot flashes, like warm places or stress. Take slow, deep breaths when a hot flash starts. Keep a fan in your home and office. Find ways to manage stress. You may want to try: Deep breathing. Meditation. Writing in a journal. Ask your provider about going to group therapy. Therapy can help you get support from others who are going through menopause. General instructions  Talk with your provider before you take any herbal supplements. Keep track of your symptoms. Track: When they start. How often you have them. How long they last. Use vaginal lubricants or moisturizers. These can help with: Vaginal dryness. Comfort during sex. Contact a health care provider if: You're older than 55 and still get periods. You have pain during sex. You haven't had a period for 12 months and then start to bleed from your vagina. It hurts to pee. You get very bad headaches. Get help right away if: You're very depressed. You have a lot of bleeding from your vagina. Your heart is beating too fast. You have very bad belly pain or indigestion that doesn't go away with medicines. This information is not intended to replace advice given to you by your health care provider. Make sure you discuss any  questions you have with your health care provider. Document Revised: 02/28/2023 Document Reviewed: 02/28/2023 Elsevier Patient Education  2024 [employer] Inc[employer]Dizziness Dizziness is a common problem. It makes you feel unsteady or light-headed. You may feel like you're about to faint. Dizziness can lead to getting hurt if you stumble or fall. It's more common to feel dizzy if you're an older adult. Many things can cause you to feel dizzy. These include: Medicines. Dehydration. This is when there's not enough water in your body. Illness. Follow these instructions at home: Eating and drinking  Drink enough fluid to keep your pee (urine) pale yellow. This helps keep you from getting dehydrated. Try to drink more clear fluids, such as water. Do not drink alcohol. Try to limit how much caffeine you take in. Try to limit how much salt, also called sodium, you take in. Activity Try not to make quick movements. Stand up slowly from sitting in a chair. Steady yourself until you feel okay. In the morning, first sit up on the side of the bed. When you feel okay, hold onto something and slowly stand up. Do this until you  know that your balance is okay. If you need to stand in one place for a long time, move your legs often. Tighten and relax the muscles in your legs while you're standing. Do not drive or use machines if you feel dizzy. Avoid bending down if you feel dizzy. Place items in your home so you can reach them without leaning over. Lifestyle Do not smoke, vape, or use products with nicotine or tobacco in them. If you need help quitting, talk with your health care provider. Try to lower your stress level. You can do this by using methods like yoga or meditation. Talk with your provider if you need help. General instructions Watch your dizziness for any changes. Take your medicines only as told by your provider. Talk with your provider if you think you're dizzy because of a medicine you're  taking. Tell a friend or a family member that you're feeling dizzy. If they spot any changes in your behavior, have them call your provider. Contact a health care provider if: Your dizziness doesn't go away, or you have new symptoms. Your dizziness gets worse. You feel like you may vomit. You have trouble hearing. You have a fever. You have neck pain or a stiff neck. You fall or get hurt. Get help right away if: You vomit each time you eat or drink. You have watery poop and can't eat or drink. You have trouble talking, walking, swallowing, or using your arms, hands, or legs. You feel very weak. You're bleeding. You're not thinking clearly, or you have trouble forming sentences. A friend or family member may spot this. Your vision changes, or you get a very bad headache. These symptoms may be an emergency. Call 911 right away. Do not wait to see if the symptoms will go away. Do not drive yourself to the hospital. This information is not intended to replace advice given to you by your health care provider. Make sure you discuss any questions you have with your health care provider. Document Revised: 03/28/2023 Document Reviewed: 08/09/2022 Elsevier Patient Education  2024 [employer].

## 2023-12-10 NOTE — Progress Notes (Signed)
 Established Patient Office Visit  Subjective   Patient ID: Christine Rojas, female    DOB: [birth year]  Age: 58 y.o. MRN: [id number]  Chief Complaint  Patient presents with   Dizziness    Vomited x 1 head position changes worsening dizziness    57y/o caucasian female established patient here for dizziness after having hot flash then throwing up at workcenter prior to coming to clinic.  Has eaten today tuna and crackers at 9am and low carb wrap breakfast prior to work.  Was started on veozah  a couple weeks ago for hot flashes.  Was told possible side effect n/v/d and to notify her provider if these occur.  Denied known sick contacts.  Dizziness worsens with looking up/moving head in certain positions.  Has not had vertigo at home this week not taking meclizine .  Denied headache/body aches/palpitations/dyspnea/weakness.  On semaglutide  for intentional weight loss      Review of Systems  Constitutional:  Positive for weight loss. Negative for chills, diaphoresis, fever and malaise/fatigue.  HENT:  Negative for congestion, ear discharge, ear pain, hearing loss, nosebleeds, sinus pain, sore throat and tinnitus.   Eyes:  Negative for blurred vision, double vision and photophobia.  Respiratory:  Negative for cough, hemoptysis, sputum production, shortness of breath, wheezing and stridor.   Cardiovascular:  Negative for chest pain.  Gastrointestinal:  Positive for nausea and vomiting. Negative for diarrhea.  Genitourinary:  Negative for dysuria.  Musculoskeletal:  Negative for back pain, myalgias and neck pain.  Skin:  Negative for itching and rash.  Neurological:  Positive for dizziness. Negative for tingling, tremors, speech change, focal weakness and weakness.  Psychiatric/Behavioral:  The patient does not have insomnia.       Objective:      BP (!) 141/98 (BP Location: Left Arm, Patient Position: Sitting)   Pulse 87   Temp (!) 97.5 F (36.4 C) (Tympanic)   SpO2 97%    Physical  Exam Vitals and nursing note reviewed.  Constitutional:      General: She is awake. She is not in acute distress.    Appearance: Normal appearance. She is well-developed and well-groomed. She is ill-appearing. She is not toxic-appearing or diaphoretic.  HENT:     Head: Normocephalic and atraumatic.     Jaw: There is normal jaw occlusion.     Salivary Glands: Right salivary gland is not diffusely enlarged or tender. Left salivary gland is not diffusely enlarged or tender.     Right Ear: Hearing and external ear normal. No decreased hearing noted. No laceration, drainage, swelling or tenderness. A middle ear effusion is present. There is no impacted cerumen. No foreign body. No mastoid tenderness. No PE tube. No hemotympanum. Tympanic membrane is not injected, scarred, perforated, erythematous, retracted or bulging.     Left Ear: Hearing and external ear normal. No decreased hearing noted. No laceration, drainage, swelling or tenderness. A middle ear effusion is present. There is no impacted cerumen. No foreign body. No mastoid tenderness. No PE tube. No hemotympanum. Tympanic membrane is not injected, scarred, perforated, erythematous, retracted or bulging.     Ears:     Comments: Bilateral TMs intact air fluid level clear; no debris noted bilateral auditory canals    Nose: Nose normal. No congestion or rhinorrhea.     Right Turbinates: Not enlarged, swollen or pale.     Left Turbinates: Not enlarged, swollen or pale.     Right Sinus: No maxillary sinus tenderness or frontal sinus tenderness.  Left Sinus: No maxillary sinus tenderness or frontal sinus tenderness.     Mouth/Throat:     Lips: Pink. No lesions.     Mouth: Mucous membranes are moist. No oral lesions or angioedema.     Dentition: No gum lesions.     Tongue: No lesions. Tongue does not deviate from midline.     Palate: No mass and lesions.     Pharynx: Uvula midline. Pharyngeal swelling and postnasal drip present. No  oropharyngeal exudate, posterior oropharyngeal erythema or uvula swelling.     Tonsils: No tonsillar exudate.     Comments: Cobblestoning posterior pharynx; bilateral allergic shiners; nasal turbinates edema clear discharge Eyes:     General: Lids are normal. Vision grossly intact. Gaze aligned appropriately. Allergic shiner present. No scleral icterus.       Right eye: No discharge.        Left eye: No discharge.     Extraocular Movements: Extraocular movements intact.     Conjunctiva/sclera: Conjunctivae normal.     Pupils: Pupils are equal, round, and reactive to light.  Neck:     Trachea: Trachea and phonation normal.  Cardiovascular:     Rate and Rhythm: Normal rate and regular rhythm.     Pulses: Normal pulses.          Radial pulses are 2+ on the right side and 2+ on the left side.     Heart sounds: Normal heart sounds, S1 normal and S2 normal.  Pulmonary:     Effort: Pulmonary effort is normal. No respiratory distress.     Breath sounds: Normal breath sounds and air entry. No stridor or transmitted upper airway sounds. No decreased breath sounds, wheezing, rhonchi or rales.     Comments: Spoke full sentences without difficulty; no cough observed in exam room Chest:     Chest wall: No tenderness.  Abdominal:     General: Abdomen is flat. Bowel sounds are decreased. There is no distension.     Palpations: There is no mass.     Tenderness: There is no abdominal tenderness. There is no guarding or rebound. Negative signs include Murphy's sign.     Hernia: No hernia is present.     Comments: Hypoactive bowel sounds x 4 quads; dull to percussion x 4 quads; abdomen soft x 4 quads  Musculoskeletal:        General: Normal range of motion.     Right hand: Normal strength. Normal capillary refill.     Left hand: Normal strength. Normal capillary refill.     Cervical back: Normal range of motion and neck supple. No swelling, edema, deformity, erythema, signs of trauma, lacerations,  rigidity, spasms, torticollis, tenderness or crepitus. No pain with movement or muscular tenderness. Normal range of motion.     Thoracic back: No swelling, edema, deformity, signs of trauma, lacerations, spasms or tenderness. Normal range of motion.     Right lower leg: No edema.     Left lower leg: No edema.  Lymphadenopathy:     Head:     Right side of head: No submental, submandibular, tonsillar, preauricular, posterior auricular or occipital adenopathy.     Left side of head: No submental, submandibular, tonsillar, preauricular, posterior auricular or occipital adenopathy.     Cervical: No cervical adenopathy.     Right cervical: No superficial, deep or posterior cervical adenopathy.    Left cervical: No superficial, deep or posterior cervical adenopathy.  Skin:    General: Skin is warm and  dry.     Capillary Refill: Capillary refill takes less than 2 seconds.     Coloration: Skin is not ashen, cyanotic, jaundiced, mottled, pale or sallow.     Findings: No abrasion, abscess, acne, bruising, burn, ecchymosis, erythema, signs of injury, laceration, lesion, petechiae, rash or wound.     Nails: There is no clubbing.     Comments: Anterior face/neck and hands visually inspected  Neurological:     General: No focal deficit present.     Mental Status: She is alert and oriented to person, place, and time. Mental status is at baseline.     GCS: GCS eye subscore is 4. GCS verbal subscore is 5. GCS motor subscore is 6.     Cranial Nerves: Cranial nerves 2-12 are intact. No cranial nerve deficit, dysarthria or facial asymmetry.     Motor: Motor function is intact. No weakness, tremor, atrophy, abnormal muscle tone or seizure activity.     Coordination: Coordination is intact. Coordination normal.     Gait: Gait is intact. Gait normal.     Comments: In/out of chair without difficulty; gait sure and steady in clinic; bilateral hand grasp equal 5/5  Psychiatric:        Attention and Perception:  Attention and perception normal.        Mood and Affect: Mood and affect normal.        Speech: Speech normal.        Behavior: Behavior normal. Behavior is cooperative.        Thought Content: Thought content normal.        Cognition and Memory: Cognition and memory normal.        Judgment: Judgment normal.      Results for orders placed or performed in visit on 12/10/23  POCT Glucose (CBG)-Manual entry (CPT [phone])  Result Value Ref Range   POC Glucose 92 70 - 99 mg/dl  Discussed POCT results with patient recommended have snack with 15 grams sugar after leaving clinic easily digestable carbohydrates as nonfasting and less than 100 and she has been sensitive to relative low blood sugar levels in the past more noticeable since starting semaglutide .  Patient agreed with plan of care and had no further questions at this time.    The 10-year ASCVD risk score (Arnett DK, et al., 2019) is: 6.7%    Assessment & Plan:   Problem List Items Addressed This Visit   None Visit Diagnoses       Type 2 diabetes mellitus without complication, without long-term current use of insulin (HCC)    -  Primary   Relevant Orders   POCT Glucose (CBG)-Manual entry (CPT [phone]) (Completed)     Dizziness         POCT glucose now.  Discussed with patient otitic effusion noted on exam dizziness could be low blood sugar relative, medication side effect veozah , semaglutide  side effect.  BP and HR stable along with SP02 and afebrile.  Meclizine  25mg  po prn helpful in the past she will check at home if supply remaining.. Supportive treatment may take up to 4 doses meclizine  per day max 100mg  per 24 hours. Discussed signs/symptoms stroke.  Someone to drive her home if having dizziness with turning head discussed do not want car accident due to unable to clear adjoining lanes before merging or turning.  Recommended not driving during vertigo episodes. ER if aphasia, dysphasia, visual changes, weakness unilateral arm/leg,  fall, worst headache of life, incoordination.  Clinic follow up  if recurrent hot flashes, fever, ear discharge/bleeding or vomiting at work today. Consider ENT evaluation/follow up with PCM if worsening symptoms not controlled with meclizine .  Exitcare handout on vertigo, menopause and dizziness.   Patient verbalized understanding of information/agreed with plan of care and had no further questions at this time.    Return if symptoms worsen or fail to improve.    Richardine Chancy, NP

## 2023-12-10 NOTE — Progress Notes (Signed)
 Nausea and vomiting, dizzy and hot flashes. Started a new medication for hot flashes.

## 2023-12-16 ENCOUNTER — Other Ambulatory Visit (HOSPITAL_COMMUNITY): Payer: Self-pay

## 2023-12-21 ENCOUNTER — Other Ambulatory Visit (HOSPITAL_BASED_OUTPATIENT_CLINIC_OR_DEPARTMENT_OTHER): Payer: Self-pay

## 2023-12-21 ENCOUNTER — Other Ambulatory Visit (HOSPITAL_COMMUNITY): Payer: Self-pay

## 2023-12-23 ENCOUNTER — Other Ambulatory Visit: Payer: Self-pay

## 2023-12-26 ENCOUNTER — Encounter: Payer: Self-pay | Admitting: Registered Nurse

## 2023-12-26 ENCOUNTER — Other Ambulatory Visit (HOSPITAL_COMMUNITY): Payer: Self-pay

## 2023-12-26 ENCOUNTER — Other Ambulatory Visit: Payer: Self-pay

## 2023-12-26 ENCOUNTER — Telehealth: Payer: Self-pay | Admitting: Registered Nurse

## 2023-12-26 DIAGNOSIS — J301 Allergic rhinitis due to pollen: Secondary | ICD-10-CM

## 2023-12-26 MED ORDER — VEOZAH 45 MG PO TABS
45.0000 mg | ORAL_TABLET | Freq: Every day | ORAL | 0 refills | Status: DC
  Filled 2024-01-20: qty 30, 30d supply, fill #0

## 2023-12-26 MED ORDER — LORATADINE 10 MG PO TABS
10.0000 mg | ORAL_TABLET | Freq: Every day | ORAL | 3 refills | Status: AC
  Filled 2023-12-26: qty 90, 90d supply, fill #0
  Filled 2024-03-19: qty 90, 90d supply, fill #1
  Filled 2024-06-17: qty 90, 90d supply, fill #2

## 2023-12-26 NOTE — Telephone Encounter (Signed)
 Patient requested refill loratadine  10mg  po daily for allergies  electronic Rx sent to her pharmacy of choice [id number]

## 2023-12-27 ENCOUNTER — Other Ambulatory Visit: Payer: Self-pay

## 2023-12-27 ENCOUNTER — Other Ambulatory Visit (HOSPITAL_COMMUNITY): Payer: Self-pay

## 2024-01-20 ENCOUNTER — Other Ambulatory Visit: Payer: Self-pay

## 2024-01-20 ENCOUNTER — Other Ambulatory Visit (HOSPITAL_COMMUNITY): Payer: Self-pay

## 2024-01-20 MED ORDER — OZEMPIC (1 MG/DOSE) 4 MG/3ML ~~LOC~~ SOPN
1.0000 mg | PEN_INJECTOR | SUBCUTANEOUS | 1 refills | Status: DC
  Filled 2024-01-20 (×2): qty 3, 28d supply, fill #0
  Filled 2024-02-11: qty 3, 28d supply, fill #1

## 2024-01-21 ENCOUNTER — Other Ambulatory Visit: Payer: Self-pay

## 2024-01-23 ENCOUNTER — Other Ambulatory Visit: Payer: Self-pay

## 2024-01-23 ENCOUNTER — Encounter: Payer: Self-pay | Admitting: Registered Nurse

## 2024-01-23 ENCOUNTER — Telehealth: Payer: Self-pay | Admitting: Registered Nurse

## 2024-01-23 DIAGNOSIS — I10 Essential (primary) hypertension: Secondary | ICD-10-CM

## 2024-01-23 DIAGNOSIS — E119 Type 2 diabetes mellitus without complications: Secondary | ICD-10-CM

## 2024-01-23 DIAGNOSIS — E7800 Pure hypercholesterolemia, unspecified: Secondary | ICD-10-CM

## 2024-01-23 MED ORDER — HYDROCHLOROTHIAZIDE 25 MG PO TABS: 25.0000 mg | ORAL_TABLET | Freq: Every day | ORAL | 0 refills | Status: DC

## 2024-01-23 MED ORDER — VEOZAH 45 MG PO TABS
45.0000 mg | ORAL_TABLET | Freq: Every day | ORAL | 0 refills | Status: DC
  Filled 2024-02-14 – 2024-02-18 (×2): qty 30, 30d supply, fill #0

## 2024-01-23 MED ORDER — LISINOPRIL 10 MG PO TABS: 10.0000 mg | ORAL_TABLET | Freq: Every day | ORAL | 0 refills | Status: DC

## 2024-01-23 MED ORDER — ATORVASTATIN CALCIUM 40 MG PO TABS: 40.0000 mg | ORAL_TABLET | Freq: Every day | ORAL | 0 refills | Status: DC

## 2024-01-23 MED ORDER — OMEPRAZOLE 40 MG PO CPDR: 40.0000 mg | DELAYED_RELEASE_CAPSULE | Freq: Every day | ORAL | 0 refills | Status: DC

## 2024-01-23 MED ORDER — METFORMIN HCL 1000 MG PO TABS: 1000.0000 mg | ORAL_TABLET | Freq: Every day | ORAL | 0 refills | Status: DC

## 2024-01-23 NOTE — Telephone Encounter (Signed)
 Patient requested refill of hydochlorothiazide 25mg , lisinopril  10mg , omeprazole  DR 40mg , atorvastatin  40mg  po daily #90 and metformin  1000mg  po BID #180 last filled 10/29/23  Dispensed 90 tabs each except metformin  180 tabs again today to patient  last BP 141/98 sick visit and PCM 116/66 10/29/23 Labs current next due Jan 2026 Results Component Value Reference Range Notes  Pap/HPV ([place]) Reviewed date:09/25/2023 11:32:18 AM Interpretation:NILM, HRHPV neg Performing Lab: Notes/Report: NILM, HRHPV neg  Comp Metabolic Panel Reviewed date:10/29/2023 10:50:17 AM Interpretation: Performing Lab: Notes/Report: Testing Performed at: [facility], [address], [address], [place], [place] [address]  Glucose 146 70-99 mg/dL    BUN 21 3-73 mg/dL    Creatinine 9.09 9.39-8.69 mg/dl    [id number] 74 >39 calc In accordance with recommendations from NKF-ASN Task Force, [facility] has updated its eGFR calc to the 2021 CKD-EDI equation that estimates kidney function without a race variable;Stage 1 > 90 ML/Min plus Albuminuria;Stage 2 60-89 ML/MIN;Stage 3 30-59 ML/MIN;Stage 4 15-29 ML/MIN;Stage 5 <15 ML/MIN  Sodium 137 136-145 mmol/L    Potassium 4.4 3.5-5.5 mmol/L    Chloride 100 98-107 mmol/L    CO2 27 22-32 mmol/L    Anion Gap 14.7 6.0-20.0 mmol/L    Calcium  9.7 8.6-10.3 mg/dL    CA-corrected 0.71 1.39-89.69 mg/dL    Protein, Total 6.9 3.9-1.6 g/dL    Albumin 4.5 6.5-5.1 g/dL    TBIL 0.3 9.6-8.9 mg/dL    ALP 73 61-873 U/L    AST 19 0-39 U/L    ALT 34 0-52 U/L    Comp Metabolic Panel (Not yet reviewed by provider) Interpretation: Performing Lab: Notes/Report: Testing Performed at: [facility], [address], [address], [place], [place] [address]  Glucose 122 70-99 mg/dL    BUN 22 3-73 mg/dL    Creatinine 9.01 9.39-8.69 mg/dl    [id number] 67 >39 calc In accordance with recommendations from NKF-ASN Task Force, [facility] has updated its eGFR calc to the 2021 CKD-EDI equation that estimates kidney function  without a race variable;Stage 1 > 90 ML/Min plus Albuminuria;Stage 2 60-89 ML/MIN;Stage 3 30-59 ML/MIN;Stage 4 15-29 ML/MIN;Stage 5 <15 ML/MIN  Sodium 137 136-145 mmol/L    Potassium 4.4 3.5-5.5 mmol/L    Chloride 99 98-107 mmol/L    CO2 30 22-32 mmol/L    Anion Gap 12.6 6.0-20.0 mmol/L    Calcium  9.8 8.6-10.3 mg/dL    CA-corrected 0.60 1.39-89.69 mg/dL    Protein, Total 6.9 3.9-1.6 g/dL    Albumin 4.5 6.5-5.1 g/dL    TBIL 0.4 9.6-8.9 mg/dL    ALP 73 61-873 U/L    AST 19 0-39 U/L    ALT 37 0-52 U/L    3D Mammogram Screening Breast Bilateral Reviewed date:08/08/2023 03:25:53 PM Interpretation:BI-RADS 1: Negative Performing Lab: Notes/Report: BI-RADS 1: Negative  Comp Metabolic Panel Reviewed date:11/27/2023 01:59:31 PM Interpretation: Performing Lab: Notes/Report: Testing Performed at: [facility], [address], [address], [place], [place] [address]  Glucose 102 70-99 mg/dL    BUN 18 3-73 mg/dL    Creatinine 9.21 9.39-8.69 mg/dl    [id number] 88 >39 calc In accordance with recommendations from NKF-ASN Task Force, [facility] has updated its eGFR calc to the 2021 CKD-EDI equation that estimates kidney function without a race variable;Stage 1 > 90 ML/Min plus Albuminuria;Stage 2 60-89 ML/MIN;Stage 3 30-59 ML/MIN;Stage 4 15-29 ML/MIN;Stage 5 <15 ML/MIN  Sodium 137 136-145 mmol/L    Potassium 4.1 3.5-5.5 mmol/L    Chloride 100 98-107 mmol/L    CO2 29 22-32 mmol/L  Anion Gap 12.4 6.0-20.0 mmol/L    Calcium  9.4 8.6-10.3 mg/dL    CA-corrected 0.94 1.39-89.69 mg/dL    Protein, Total 6.6 3.9-1.6 g/dL    Albumin 4.4 6.5-5.1 g/dL    TBIL 0.3 9.6-8.9 mg/dL    ALP 67 61-873 U/L    AST 18 0-39 U/L    ALT 35 0-52 U/L    Microalbumin Panel Reviewed date:01/08/2023 07:58:03 AM Interpretation:No protein Performing Lab: Notes/Report: Testing Performed at: [facility], [address], [address], [place], [place] [address]  MA/CR ratio <31.5 0.0-30.0 mg/G    UMA <0.7        Latest Reference  Range & Units 07/18/23 08:00  Sodium 134 - 144 mmol/L 138  Potassium 3.5 - 5.2 mmol/L 4.9  Chloride 96 - 106 mmol/L 99  Glucose 70 - 99 mg/dL 875 (H)  BUN 6 - 24 mg/dL 21  Creatinine 9.42 - 8.99 mg/dL 9.17  Calcium  8.7 - 10.2 mg/dL 9.3  BUN/Creatinine Ratio 9 - 23  26 (H)  eGFR >59 mL/min/1.73 83  Phosphorus 3.0 - 4.3 mg/dL 4.2  Alkaline Phosphatase 44 - 121 IU/L 82  Albumin 3.8 - 4.9 g/dL 4.5  Uric Acid 3.0 - 7.2 mg/dL 7.8 (H)  AST 0 - 40 IU/L 20  ALT 0 - 32 IU/L 36 (H)  Total Protein 6.0 - 8.5 g/dL 6.8  Total Bilirubin 0.0 - 1.2 mg/dL 0.4  GGT 0 - 60 IU/L 38  Estimated CHD Risk 0.0 - 1.0 times avg.  < 0.5  LDH 119 - 226 IU/L 151  Total CHOL/HDL Ratio 0.0 - 4.4 ratio 2.8  Cholesterol, Total 100 - 199 mg/dL 829  HDL Cholesterol >60 mg/dL 61  Triglycerides 0 - 850 mg/dL 884  VLDL Cholesterol Cal 5 - 40 mg/dL 20  LDL Chol Calc (NIH) 0 - 99 mg/dL 89  Iron 27 - 840 ug/dL 73  Globulin, Total 1.5 - 4.5 g/dL 2.3  WBC 3.4 - 89.1 [id number] 6.5  RBC 3.77 - 5.28 x10E6/uL 5.19  Hemoglobin 11.1 - 15.9 g/dL 84.4  HCT 65.9 - 53.3 % 45.5  MCV 79 - 97 fL 88  MCH 26.6 - 33.0 pg 29.9  MCHC 31.5 - 35.7 g/dL 65.8  RDW 88.2 - 84.5 % 12.3  Platelets 150 - 450 x10E3/uL 388  Neutrophils Not Estab. % 61  Immature Granulocytes Not Estab. % 0  NEUT# 1.4 - 7.0 x10E3/uL 4.0  Lymphs Abs 0.7 - 3.1 x10E3/uL 1.7  Monocytes Absolute 0.1 - 0.9 x10E3/uL 0.5  Basophils Absolute 0.0 - 0.2 x10E3/uL 0.0  Immature Grans (Abs) 0.0 - 0.1 x10E3/uL 0.0  Lymphs Not Estab. % 26  Monocytes Not Estab. % 7  Basos Not Estab. % 1  Eos Not Estab. % 5  EOS (ABSOLUTE) 0.0 - 0.4 x10E3/uL 0.3  Hemoglobin A1C 4.8 - 5.6 % 6.5 (H)  TSH 0.450 - 4.500 uIU/mL 1.010  Thyroxine (T4) 4.5 - 12.0 ug/dL 9.6  Free Thyroxine Index 1.2 - 4.9  2.7  T3 Uptake Ratio 24 - 39 % 28  (H): Data is abnormally high

## 2024-02-03 ENCOUNTER — Ambulatory Visit

## 2024-02-03 VITALS — BP 118/80 | HR 78

## 2024-02-03 DIAGNOSIS — Z0130 Encounter for examination of blood pressure without abnormal findings: Secondary | ICD-10-CM

## 2024-02-06 ENCOUNTER — Ambulatory Visit: Admitting: Registered Nurse

## 2024-02-06 VITALS — BP 132/94 | HR 82 | Resp 16

## 2024-02-06 DIAGNOSIS — I10 Essential (primary) hypertension: Secondary | ICD-10-CM

## 2024-02-06 NOTE — Progress Notes (Signed)
 Patient reported PCM had her stop hydrochlorothiazide  had nature's own white bread sandwich for lunch today.  Feeling a little dizzy hydrating with water  Denied hearing loss/ear discharge/fever/chills.  VSS previous 140s/90s in clinic on hydrochlorothiazide .  PCM last office visit 1001/77 on hydrochlorothiazide .  Gait sure and stead respirations even and unlabored RA biltateral TMs intact air fluid level clear no erythema no debris in auditory canals bilaterally no cough in clinic or throat clearing/nasal congestion.  Skin warm dry and pink spoke full sentences without difficulty.  Discussed restarting otc antihistamine/flonase  nasal spray and nasal saline to see if symptoms improve and continue hydrating and follow up again next week for BP recheck.  Discussed to keep salt intake to 2 grams or less per day recommended for heart healthy.  Patient agreed with plan of care and had no further questions at this time.

## 2024-02-11 ENCOUNTER — Other Ambulatory Visit (HOSPITAL_COMMUNITY): Payer: Self-pay

## 2024-02-11 ENCOUNTER — Other Ambulatory Visit: Payer: Self-pay

## 2024-02-11 MED ORDER — EZETIMIBE 10 MG PO TABS
10.0000 mg | ORAL_TABLET | Freq: Every day | ORAL | 1 refills | Status: DC
  Filled 2024-02-14: qty 90, 90d supply, fill #0
  Filled 2024-05-12: qty 90, 90d supply, fill #1

## 2024-02-13 ENCOUNTER — Other Ambulatory Visit (HOSPITAL_COMMUNITY): Payer: Self-pay

## 2024-02-14 ENCOUNTER — Other Ambulatory Visit (HOSPITAL_COMMUNITY): Payer: Self-pay

## 2024-02-14 ENCOUNTER — Other Ambulatory Visit: Payer: Self-pay

## 2024-02-18 ENCOUNTER — Other Ambulatory Visit (HOSPITAL_COMMUNITY): Payer: Self-pay

## 2024-02-18 ENCOUNTER — Other Ambulatory Visit: Payer: Self-pay

## 2024-02-20 ENCOUNTER — Other Ambulatory Visit: Payer: Self-pay

## 2024-02-20 MED ORDER — VEOZAH 45 MG PO TABS
45.0000 mg | ORAL_TABLET | Freq: Every day | ORAL | 0 refills | Status: DC
  Filled 2024-03-16: qty 90, 90d supply, fill #0
  Filled 2024-03-23: qty 30, 30d supply, fill #0
  Filled 2024-04-16 – 2024-04-20 (×2): qty 30, 30d supply, fill #1
  Filled 2024-05-16: qty 30, 30d supply, fill #2

## 2024-02-25 ENCOUNTER — Telehealth: Payer: Self-pay | Admitting: Registered Nurse

## 2024-02-25 ENCOUNTER — Encounter: Payer: Self-pay | Admitting: Registered Nurse

## 2024-02-25 ENCOUNTER — Other Ambulatory Visit (HOSPITAL_COMMUNITY): Payer: Self-pay

## 2024-02-25 DIAGNOSIS — M109 Gout, unspecified: Secondary | ICD-10-CM

## 2024-02-25 MED ORDER — COLCHICINE 0.6 MG PO TABS
ORAL_TABLET | ORAL | 0 refills | Status: AC
  Filled 2024-02-25: qty 60, 30d supply, fill #0

## 2024-02-25 MED ORDER — COLCHICINE 0.6 MG PO TABS: ORAL_TABLET | ORAL | 0 refills | Status: DC

## 2024-02-25 NOTE — Telephone Encounter (Signed)
 Per epic last filled cholchicine 10/01/22 60 tabs 0.4mg .  Last labs    Latest Reference Range & Units 07/18/23 08:00  Sodium 134 - 144 mmol/L 138  Potassium 3.5 - 5.2 mmol/L 4.9  Chloride 96 - 106 mmol/L 99  Glucose 70 - 99 mg/dL 875 (H)  BUN 6 - 24 mg/dL 21  Creatinine 9.42 - 8.99 mg/dL 9.17  Calcium  8.7 - 10.2 mg/dL 9.3  BUN/Creatinine Ratio 9 - 23  26 (H)  eGFR >59 mL/min/1.73 83  Phosphorus 3.0 - 4.3 mg/dL 4.2  Alkaline Phosphatase 44 - 121 IU/L 82  Albumin 3.8 - 4.9 g/dL 4.5  Uric Acid 3.0 - 7.2 mg/dL 7.8 (H)  AST 0 - 40 IU/L 20  ALT 0 - 32 IU/L 36 (H)  Total Protein 6.0 - 8.5 g/dL 6.8  Total Bilirubin 0.0 - 1.2 mg/dL 0.4  GGT 0 - 60 IU/L 38  Estimated CHD Risk 0.0 - 1.0 times avg.  < 0.5  LDH 119 - 226 IU/L 151  Total CHOL/HDL Ratio 0.0 - 4.4 ratio 2.8  Cholesterol, Total 100 - 199 mg/dL 829  HDL Cholesterol >60 mg/dL 61  Triglycerides 0 - 850 mg/dL 884  VLDL Cholesterol Cal 5 - 40 mg/dL 20  LDL Chol Calc (NIH) 0 - 99 mg/dL 89  Iron 27 - 840 ug/dL 73  Globulin, Total 1.5 - 4.5 g/dL 2.3  WBC 3.4 - 89.1 k89Z6/lO 6.5  RBC 3.77 - 5.28 x10E6/uL 5.19  Hemoglobin 11.1 - 15.9 g/dL 84.4  HCT 65.9 - 53.3 % 45.5  MCV 79 - 97 fL 88  MCH 26.6 - 33.0 pg 29.9  MCHC 31.5 - 35.7 g/dL 65.8  RDW 88.2 - 84.5 % 12.3  Platelets 150 - 450 x10E3/uL 388  Neutrophils Not Estab. % 61  Immature Granulocytes Not Estab. % 0  NEUT# 1.4 - 7.0 x10E3/uL 4.0  Lymphs Abs 0.7 - 3.1 x10E3/uL 1.7  Monocytes Absolute 0.1 - 0.9 x10E3/uL 0.5  Basophils Absolute 0.0 - 0.2 x10E3/uL 0.0  Immature Grans (Abs) 0.0 - 0.1 x10E3/uL 0.0  Lymphs Not Estab. % 26  Monocytes Not Estab. % 7  Basos Not Estab. % 1  Eos Not Estab. % 5  EOS (ABSOLUTE) 0.0 - 0.4 x10E3/uL 0.3  Hemoglobin A1C 4.8 - 5.6 % 6.5 (H)  TSH 0.450 - 4.500 uIU/mL 1.010  Thyroxine (T4) 4.5 - 12.0 ug/dL 9.6  Free Thyroxine Index 1.2 - 4.9  2.7  T3 Uptake Ratio 24 - 39 % 28  (H): Data is abnormally high  Exitcare handouts on gout and low  purine diet sent to patient my chart and discussed avoid dehydration and high added sugar/purine diet.  Follow up with PCM if worsening/not improving as NP will be on vacation starting 28 Feb 2024.  Electronic Rx refill colchicine  sent to her pharmacy of choice #60 RF0 take 2 onset of symptoms then 1 tab 1 hour later next day start 1 tab po BID until flare subsided. Due to ozempic  use and weight loss PCM has been adjusting patient medications.  Hgba1c in the past 18 months 6-6.5  Patient agreed with plan of care and had no further questions at that time.

## 2024-03-16 ENCOUNTER — Other Ambulatory Visit: Payer: Self-pay

## 2024-03-19 ENCOUNTER — Other Ambulatory Visit (HOSPITAL_COMMUNITY): Payer: Self-pay

## 2024-03-19 ENCOUNTER — Other Ambulatory Visit: Payer: Self-pay

## 2024-03-23 ENCOUNTER — Other Ambulatory Visit (HOSPITAL_COMMUNITY): Payer: Self-pay

## 2024-03-24 ENCOUNTER — Encounter: Payer: Self-pay | Admitting: Registered Nurse

## 2024-03-24 ENCOUNTER — Telehealth: Payer: Self-pay | Admitting: Registered Nurse

## 2024-03-24 DIAGNOSIS — E119 Type 2 diabetes mellitus without complications: Secondary | ICD-10-CM

## 2024-03-24 NOTE — Telephone Encounter (Signed)
-----   Message from Nurse Channing R sent at 03/24/2024  2:40 PM EDT ----- Regarding: Medication question She has a question about an over the counter supplement that she is taking. [phone]

## 2024-03-26 NOTE — Telephone Encounter (Signed)
 Christine Rojas 700mg  per 2 capsules twice a day for blood sugar.  Asking if any Rx interactions or side effects  Reviewed NIH website and doses under 1500mg  deemed safe for 60 day use  I recommended kidney and liver function check in 3 months.  Patient stated she will have done with PCM or GYN as having regular labs drawn for Veozah  use with GYN  Results Component Value Reference Range Notes  Comp Metabolic Panel Reviewed date:02/21/2024 11:39:28 AM Interpretation: Performing Lab: Notes/Report: Testing Performed at: [facility], [address], [address], [place], [place] [address]  Glucose 132 70-99 mg/dL    BUN 19 3-73 mg/dL    Creatinine 9.24 9.39-8.69 mg/dl    [id number] 92 >39 calc Stage 1 > 90 ML/Min plus Albuminuria;Stage 2 60-89 ML/MIN;Stage 3 30-59 ML/MIN;Stage 4 15-29 ML/MIN;Stage 5 <15 ML/MIN  Sodium 136 136-145 mmol/L    Potassium 4.4 3.5-5.5 mmol/L    Chloride 99 98-107 mmol/L    CO2 30 22-32 mmol/L    Anion Gap 11.1 6.0-20.0 mmol/L    Calcium  10.0 8.6-10.3 mg/dL    CA-corrected 0.43 1.39-89.69 mg/dL    Protein, Total 6.8 3.9-1.6 g/dL    Albumin 4.5 6.5-5.1 g/dL    TBIL 0.4 9.6-8.9 mg/dL    ALP 82 61-873 U/L    AST 19 0-39 U/L    ALT 41 0-52 U/L     Reviewed date:01/21/2024 05:37:07 PM Interpretation:Normal Performing Lab: Notes/Report: Testing Performed at: [facility], [address], [address], [place], [place] [address]  Glucose 86 70-99 mg/dL    BUN 17 3-73 mg/dL    Creatinine 9.24 9.39-8.69 mg/dl    [id number] 92 >39 calc Stage 1 > 90 ML/Min plus Albuminuria;Stage 2 60-89 ML/MIN;Stage 3 30-59 ML/MIN;Stage 4 15-29 ML/MIN;Stage 5 <15 ML/MIN  Sodium 137 136-145 mmol/L    Potassium 4.1 3.5-5.5 mmol/L    Chloride 97 98-107 mmol/L    CO2 29 22-32 mmol/L    Anion Gap 15.7 6.0-20.0 mmol/L    Calcium  9.8 8.6-10.3 mg/dL    CA-corrected 0.73 1.39-89.69 mg/dL    Protein, Total 6.9 3.9-1.6 g/dL    Albumin 4.7 6.5-5.1 g/dL    TBIL 0.6 9.6-8.9 mg/dL    ALP 70 61-873 U/L     AST 22 0-39 U/L    ALT 40 0-52 U/L    Hemoglobin A1c Reviewed date:01/21/2024 05:36:21 PM Interpretation:6.5 Performing Lab: Notes/Report: Testing Performed at: [facility], [address], [address], [place], [place] [address]  eAG 140      Hgb A1c 6.5 4.8-5.6 % Prediabetes: 5.7-6.4%  Diabetes: >/= 6.5%   Microalbumin Panel Reviewed date:01/21/2024 05:37:23 PM Interpretation:No protein Performing Lab: Notes/Report: Testing Performed at: [facility], [address], [address], [place], [place] [address]  MA/CR ratio <24.2 0.0-30.0 mg/G    UMA <0.7      UCR 29       REASON FOR VISIT  F/U HTN/DM, pt gets lab work at her employment but says it has not been done this time Medications Reconcile with Patient's ChartMedications Medication SIG (Take, Route, Frequency, Duration) Notes Start Date End Date Status  hydroCHLOROthiazide  25 MG 1 tablet Orally q AM   08/01/2015   Active  Veozah  45 MG 1 tablet Orally Once a day; Duration: 30 days   10/29/2023   Active  Lisinopril  10 MG 1 tablet Orally Once a day   04/23/2011   Active  One A Day Women 50 Plus - 1 tablet Orally once a day  Active  One Touch Verio * For use when checking blood glucose finger stick Once daily, alternating AM and PM, before meals; Duration: 90 days   02/17/2014   Active  Ozempic  (1 MG/DOSE) 4 MG/3ML 1 mg Subcutaneous once a week; Duration: 90 days   04/11/2022   Active  Turmeric         Active  Atorvastatin  Calcium  40 MG 1 tablet Orally Once a day   02/17/2015   Active  Fish Oil 1000 MG 3 capsules Orally once a day       Active  Omeprazole  40 MG 1 capsule Orally Once a day       Active  Aspir-81 81 MG 1 tablet Orally Once a day   11/29/2011   Active  Calcium  + D         Active  metFORMIN  HCl 1000 MG 1 tablet with a meal Orally twice a day   12/17/2008   Active   Social History  Tobacco Use: Social History Social History Observation Description Date  Details (start date - stop date) Former Smoker  NA - NA   Tobacco use: Social History Question Answer Notes  cigarettes: Former smoker     Vital Signs  Vital Signs Height 64.5 in 01/20/2024  Weight 221.8 lbs 01/20/2024  BMI 37.48 kg/m2 01/20/2024  Blood pressure systolic 101 mm Hg 01/20/2024  Blood pressure diastolic 77 mm Hg 01/20/2024  2nd reading pulse 76

## 2024-03-27 ENCOUNTER — Other Ambulatory Visit (HOSPITAL_COMMUNITY): Payer: Self-pay

## 2024-03-30 ENCOUNTER — Other Ambulatory Visit: Payer: Self-pay

## 2024-03-30 ENCOUNTER — Other Ambulatory Visit (HOSPITAL_COMMUNITY): Payer: Self-pay

## 2024-03-30 MED ORDER — OZEMPIC (1 MG/DOSE) 4 MG/3ML ~~LOC~~ SOPN
1.0000 mg | PEN_INJECTOR | SUBCUTANEOUS | 1 refills | Status: DC
  Filled 2024-03-30: qty 3, 28d supply, fill #0
  Filled 2024-04-21: qty 3, 28d supply, fill #1

## 2024-04-07 ENCOUNTER — Encounter: Payer: Self-pay | Admitting: Registered Nurse

## 2024-04-07 ENCOUNTER — Ambulatory Visit: Admitting: Registered Nurse

## 2024-04-07 ENCOUNTER — Other Ambulatory Visit (HOSPITAL_COMMUNITY): Payer: Self-pay

## 2024-04-07 ENCOUNTER — Other Ambulatory Visit: Payer: Self-pay

## 2024-04-07 VITALS — BP 125/80 | HR 78 | Temp 97.7°F | Resp 16

## 2024-04-07 DIAGNOSIS — J209 Acute bronchitis, unspecified: Secondary | ICD-10-CM

## 2024-04-07 DIAGNOSIS — N951 Menopausal and female climacteric states: Secondary | ICD-10-CM | POA: Insufficient documentation

## 2024-04-07 DIAGNOSIS — J301 Allergic rhinitis due to pollen: Secondary | ICD-10-CM

## 2024-04-07 MED ORDER — ALBUTEROL SULFATE HFA 108 (90 BASE) MCG/ACT IN AERS
1.0000 | INHALATION_SPRAY | RESPIRATORY_TRACT | 0 refills | Status: AC | PRN
  Filled 2024-04-07: qty 6.7, 17d supply, fill #0

## 2024-04-07 NOTE — Patient Instructions (Signed)
 Acute Bronchitis, Adult  Acute bronchitis is sudden inflammation of the main airways (bronchi) that come off the windpipe (trachea) in the lungs. The swelling causes the airways to get smaller and make more mucus than normal. This can make it hard to breathe and can cause coughing or noisy breathing (wheezing). Acute bronchitis may last several weeks. The cough may last longer. Allergies, asthma, and exposure to smoke may make the condition worse. What are the causes? This condition can be caused by germs and by substances that irritate the lungs, including: Cold and flu viruses. The most common cause of this condition is the virus that causes the common cold. Bacteria. This is less common. Breathing in substances that irritate the lungs, including: Smoke from cigarettes and other forms of tobacco. Dust and pollen. Fumes from household cleaning products, gases, or burned fuel. Indoor or outdoor air pollution. What increases the risk? The following factors may make you more likely to develop this condition: A weak body's defense system, also called the immune system. A condition that affects your lungs and breathing, such as asthma. What are the signs or symptoms? Common symptoms of this condition include: Coughing. This may bring up clear, yellow, or green mucus from your lungs (sputum). Wheezing. Runny or stuffy nose. Having too much mucus in your lungs (chest congestion). Shortness of breath. Aches and pains, including sore throat or chest. How is this diagnosed? This condition is usually diagnosed based on: Your symptoms and medical history. A physical exam. You may also have other tests, including tests to rule out other conditions, such as pneumonia. These tests include: A test of lung function. Test of a mucus sample to look for the presence of bacteria. Tests to check the oxygen level in your blood. Blood tests. Chest X-ray. How is this treated? Most cases of acute  bronchitis clear up over time without treatment. Your health care provider may recommend: Drinking more fluids to help thin your mucus so it is easier to cough up. Taking inhaled medicine (inhaler) to improve air flow in and out of your lungs. Using a vaporizer or a humidifier. These are machines that add water to the air to help you breathe better. Taking a medicine that thins mucus and clears congestion (expectorant). Taking a medicine that prevents or stops coughing (cough suppressant). It is not common to take an antibiotic medicine for this condition. Follow these instructions at home:  Take over-the-counter and prescription medicines only as told by your health care provider. Use an inhaler, vaporizer, or humidifier as told by your health care provider. Take two teaspoons (10 mL) of honey at bedtime to lessen coughing at night. Drink enough fluid to keep your urine pale yellow. Do not use any products that contain nicotine or tobacco. These products include cigarettes, chewing tobacco, and vaping devices, such as e-cigarettes. If you need help quitting, ask your health care provider. Get plenty of rest. Return to your normal activities as told by your health care provider. Ask your health care provider what activities are safe for you. Keep all follow-up visits. This is important. How is this prevented? To lower your risk of getting this condition again: Wash your hands often with soap and water for at least 20 seconds. If soap and water are not available, use hand sanitizer. Avoid contact with people who have cold symptoms. Try not to touch your mouth, nose, or eyes with your hands. Avoid breathing in smoke or chemical fumes. Breathing smoke or chemical fumes will make  your condition worse. Get the flu shot every year. Contact a health care provider if: Your symptoms do not improve after 2 weeks. You have trouble coughing up the mucus. Your cough keeps you awake at night. You have  a fever. Get help right away if you: Cough up blood. Feel pain in your chest. Have severe shortness of breath. Faint or keep feeling like you are going to faint. Have a severe headache. Have a fever or chills that get worse. These symptoms may represent a serious problem that is an emergency. Do not wait to see if the symptoms will go away. Get medical help right away. Call your local emergency services (911 in the [place] Do not drive yourself to the hospital. Summary Acute bronchitis is inflammation of the main airways (bronchi) that come off the windpipe (trachea) in the lungs. The swelling causes the airways to get smaller and make more mucus than normal. Drinking more fluids can help thin your mucus so it is easier to cough up. Take over-the-counter and prescription medicines only as told by your health care provider. Do not use any products that contain nicotine or tobacco. These products include cigarettes, chewing tobacco, and vaping devices, such as e-cigarettes. If you need help quitting, ask your health care provider. Contact a health care provider if your symptoms do not improve after 2 weeks. This information is not intended to replace advice given to you by your health care provider. Make sure you discuss any questions you have with your health care provider. Document Revised: 10/05/2021 Document Reviewed: 10/26/2020 Elsevier Patient Education  2024 [employer] Inc.How to Perform a Sinus Rinse A sinus rinse is a home treatment that is used to rinse your sinuses with a germ-free (sterile) mixture of salt and water (saline solution). Sinuses are air-filled spaces in your skull that are behind the bones of your face and forehead. They open into your nasal cavity. A sinus rinse can help to clear mucus, dirt, dust, or pollen from your nasal cavity. You may do a sinus rinse when you have a cold, a virus, nasal allergy symptoms, a sinus infection, or stuffiness in your nose or sinuses. What  are the risks? A sinus rinse is generally safe and effective. However, there are a few risks, which include: A burning sensation in your sinuses. This may happen if you do not make the saline solution as directed. Be sure to follow all directions when making the saline solution. Nasal irritation. Infection. This may be from unclean supplies or from contaminated water. Infection from contaminated water is rare, but possible. Do not do a sinus rinse if you have had ear or nasal surgery, ear infection, or plugged ears, unless recommended by your health care provider. Supplies needed: Saline solution or powder. Distilled or sterile water to mix with saline powder. You may use boiled and cooled tap water. Boil tap water for 5 minutes; cool until it is lukewarm. Use within 24 hours. Do not use regular tap water to mix with the saline solution. Neti pot or nasal rinse bottle. These supplies release the saline solution into your nose and through your sinuses. Neti pots and nasal rinse bottles can be purchased at [employer], a health food store, or online. How to perform a sinus rinse  Wash your hands with soap and water for at least 20 seconds. If soap and water are not available, use hand sanitizer. Wash your device according to the directions that came with the product and then dry  it. Use the solution that comes with your product or one that is sold separately in stores. Follow the mixing directions on the package to mix with sterile or distilled water. Fill the device with the amount of saline solution noted in the device instructions. Stand by a sink and tilt your head sideways over the sink. Place the spout of the device in your upper nostril (the one closer to the ceiling). Gently pour or squeeze the saline solution into your nasal cavity. The liquid should drain out from the lower nostril if you are not too congested. While rinsing, breathe through your open mouth. Gently blow your  nose to clear any mucus and rinse solution. Blowing too hard may cause ear pain. Turn your head in the other direction and repeat in your other nostril. Clean and rinse your device with clean water and then air-dry it. Talk with your health care provider or pharmacist if you have questions about how to do a sinus rinse. Summary A sinus rinse is a home treatment that is used to rinse your sinuses with a sterile mixture of salt and water (saline solution). You may do a sinus rinse when you have a cold, a virus, nasal allergy symptoms, a sinus infection, or stuffiness in your nose or sinuses. A sinus rinse is generally safe and effective. Follow all instructions carefully. This information is not intended to replace advice given to you by your health care provider. Make sure you discuss any questions you have with your health care provider. Document Revised: 12/12/2020 Document Reviewed: 12/12/2020 Elsevier Patient Education  2024 [employer] Inc.Allergic Rhinitis, Adult  Allergic rhinitis is an allergic reaction that affects the mucous membrane inside the nose. The mucous membrane is the tissue that produces mucus. There are two types of allergic rhinitis: Seasonal. This type is also called hay fever and happens only during certain seasons. Perennial. This type can happen at any time of the year. Allergic rhinitis cannot be spread from person to person. This condition can be mild, bad, or very bad. It can develop at any age and may be outgrown. What are the causes? This condition is caused by allergens. These are things that can cause an allergic reaction. Allergens may differ for seasonal allergic rhinitis and perennial allergic rhinitis. Seasonal allergic rhinitis is caused by pollen. Pollen can come from grasses, trees, and weeds. Perennial allergic rhinitis may be caused by: Dust mites. Proteins in a pet's pee (urine), saliva, or dander. Dander is dead skin cells from a pet. Smoke, mold, or  car fumes. Remains of or waste from insects such as cockroaches. What increases the risk? You are more likely to develop this condition if you have a family history of allergies or other conditions related to allergies, including: Allergic conjunctivitis. This is irritation and swelling of parts of the eyes and eyelids. Asthma. This condition affects the lungs and makes it hard to breathe. Atopic dermatitis or eczema. This is long term (chronic) irritation and swelling of the skin. Food allergies. What are the signs or symptoms? Symptoms of this condition include: Sneezing or coughing. A stuffy nose (nasal congestion), itchy nose, or nasal discharge. Itchy eyes and tearing of the eyes. A feeling of mucus dripping down the back of your throat (postnasal drip). This may cause a sore throat. Trouble sleeping. Tiredness. Headache. How is this diagnosed? This condition may be diagnosed with your symptoms, your medical history, and a physical exam. Your health care provider may check for related conditions, such as:  Asthma. Pink eye. This is eye swelling and irritation caused by infection (conjunctivitis). Ear infection. Upper respiratory infection. This is an infection in the nose, throat, or upper airways. You may also have tests to find out which allergens cause your symptoms. These may include skin tests or blood tests. How is this treated? There is no cure for this condition, but treatment can help control symptoms. Treatment may include: Taking medicines that block allergy symptoms, such as corticosteroids (anti-inflammatories) and antihistamines. Medicine may be given as a shot, nasal spray, or pill. Avoiding any allergens. Being exposed again and again to tiny amounts of allergens to help you build a defense against allergens (allergenimmunotherapy). This is done if other treatments have not helped. It may include: Allergy shots. These are injected medicines that have small amounts of  an allergen in them. Sublingual immunotherapy. This involves taking small doses of a medicine with an allergen in it under your tongue. If these treatments do not work, your provider may prescribe newer, stronger medicines. Follow these instructions at home: Avoiding allergens Find out what you are allergic to and avoid those allergens. These are some things you can do to help avoid allergens: If you have perennial allergies: Replace carpet with wood, tile, or vinyl flooring. Carpet can trap dander and dust. Do not smoke. Do not allow smoking in your home Change your heating and air conditioning filters at least once a month. If you have seasonal allergies, take these steps during allergy season: Keep windows closed as much as possible. Plan outdoor activities when pollen counts are lowest. Check pollen counts before you plan outdoor activities When coming indoors, change clothing and shower before sitting on furniture or bedding. If you have a pet in the house that produces allergens: Keep the pet out of the bedroom. Vacuum, sweep, and dust regularly. General instructions Take over-the-counter and prescription medicines only as told by your provider. Drink enough fluid to keep your pee pale yellow. Where to find more information American Academy of Allergy, Asthma & Immunology: aaaai.org Contact a health care provider if: You have a fever. You develop a cough that does not go away. You make high-pitched whistling sounds when you breathe, most often when you breathe out (wheeze). Your symptoms slow you down or stop you from doing your normal activities each day. Get help right away if: You have shortness of breath. This symptom may be an emergency. Get help right away. Call 911. Do not wait to see if the symptoms will go away. Do not drive yourself to the hospital. This information is not intended to replace advice given to you by your health care provider. Make sure you discuss any  questions you have with your health care provider. Document Revised: 03/05/2022 Document Reviewed: 03/05/2022 Elsevier Patient Education  2024 [employer].

## 2024-04-07 NOTE — Progress Notes (Signed)
 Visiting clinic for NP assessment.
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
## 2024-04-16 ENCOUNTER — Other Ambulatory Visit (HOSPITAL_COMMUNITY): Payer: Self-pay

## 2024-04-17 ENCOUNTER — Other Ambulatory Visit (HOSPITAL_COMMUNITY): Payer: Self-pay

## 2024-04-21 ENCOUNTER — Encounter: Payer: Self-pay | Admitting: Registered Nurse

## 2024-04-21 ENCOUNTER — Ambulatory Visit: Admitting: Registered Nurse

## 2024-04-21 VITALS — BP 128/66 | HR 68 | Temp 97.0°F

## 2024-04-21 DIAGNOSIS — I10 Essential (primary) hypertension: Secondary | ICD-10-CM

## 2024-04-21 DIAGNOSIS — E119 Type 2 diabetes mellitus without complications: Secondary | ICD-10-CM

## 2024-04-21 DIAGNOSIS — K219 Gastro-esophageal reflux disease without esophagitis: Secondary | ICD-10-CM

## 2024-04-21 DIAGNOSIS — E66812 Obesity, class 2: Secondary | ICD-10-CM | POA: Insufficient documentation

## 2024-04-21 DIAGNOSIS — J301 Allergic rhinitis due to pollen: Secondary | ICD-10-CM

## 2024-04-21 DIAGNOSIS — Z6839 Body mass index (BMI) 39.0-39.9, adult: Secondary | ICD-10-CM | POA: Insufficient documentation

## 2024-04-21 DIAGNOSIS — Q899 Congenital malformation, unspecified: Secondary | ICD-10-CM | POA: Insufficient documentation

## 2024-04-21 DIAGNOSIS — E782 Mixed hyperlipidemia: Secondary | ICD-10-CM

## 2024-04-21 DIAGNOSIS — E7800 Pure hypercholesterolemia, unspecified: Secondary | ICD-10-CM

## 2024-04-21 MED ORDER — OMEPRAZOLE 40 MG PO CPDR
40.0000 mg | DELAYED_RELEASE_CAPSULE | Freq: Every day | ORAL | 0 refills | Status: DC
  Filled 2024-04-21: qty 90, 90d supply, fill #0

## 2024-04-21 MED ORDER — SALINE SPRAY 0.65 % NA SOLN
2.0000 | NASAL | 0 refills | Status: AC
  Filled 2024-04-21 – 2024-04-22 (×2): qty 44, 49d supply, fill #0

## 2024-04-21 MED ORDER — FLUTICASONE PROPIONATE 50 MCG/ACT NA SUSP
1.0000 | Freq: Two times a day (BID) | NASAL | 1 refills | Status: AC
  Filled 2024-04-21: qty 16, 30d supply, fill #0

## 2024-04-21 MED ORDER — HYDROCHLOROTHIAZIDE 25 MG PO TABS: 25.0000 mg | ORAL_TABLET | Freq: Every day | ORAL | Status: DC | PRN

## 2024-04-21 MED ORDER — METFORMIN HCL 1000 MG PO TABS: 1000.0000 mg | ORAL_TABLET | Freq: Two times a day (BID) | ORAL | Status: DC

## 2024-04-21 MED ORDER — LISINOPRIL 10 MG PO TABS
10.0000 mg | ORAL_TABLET | Freq: Every day | ORAL | 0 refills | Status: DC
  Filled 2024-04-21: qty 90, 90d supply, fill #0

## 2024-04-21 MED ORDER — ATORVASTATIN CALCIUM 40 MG PO TABS
40.0000 mg | ORAL_TABLET | Freq: Every day | ORAL | 0 refills | Status: DC
  Filled 2024-04-21: qty 90, 90d supply, fill #0

## 2024-04-21 NOTE — Progress Notes (Signed)
 Established Patient Office Visit  Subjective   Patient ID: Christine Rojas, female    DOB: 06-25-1966  Age: 58 y.o. MRN: [id number]  Chief Complaint  Patient presents with   Shortness of Breath    Questioning allergies; SOB comes/goes   Medication Refill    Omeprazole , metformin , lisinopril , hydrochlorothiazide  and atorvastatin  [facility]    58y/o caucasian female established patient here for evaluation chest pain/intermittent shortness of breath at rest.  Also needs medication refills from [facility] metformin /atorvastatin /lisinopril /hydrochlorothiazide /omeprazole  DR.  Last filled from [facility] 2024/02/22; mother and grandmother died at age 63 and she is 58y/o  forgot to take allergy medications today and yesterday had some shortness of breath at rest Friday stayed home from work.  Denied fever/chills/n/v/d/wheezing today.  Did not use her inhaler as worried if heart could make things worse e.g. increase pulse rate  Unsure if symptoms just related to her forgetting to take her allergy pill 2 different days.  Denied recent illness.  Others at work have been sick.  Stated her chronic medications have been working well for her and she has been taking them as prescribed.      Review of Systems  Constitutional:  Negative for chills, diaphoresis, fever and malaise/fatigue.  HENT:  Positive for congestion. Negative for ear discharge, ear pain, nosebleeds, sinus pain and sore throat.   Eyes:  Negative for blurred vision, double vision, photophobia, pain, discharge and redness.  Respiratory:  Positive for sputum production, shortness of breath and wheezing. Negative for cough, hemoptysis and stridor.   Cardiovascular:  Positive for chest pain. Negative for palpitations and leg swelling.  Gastrointestinal:  Negative for diarrhea, nausea and vomiting.  Genitourinary:  Negative for dysuria.  Musculoskeletal:  Negative for back pain, falls, joint pain, myalgias and neck pain.  Skin:  Negative for itching and rash.   Neurological:  Negative for dizziness, tingling, tremors, sensory change, speech change, focal weakness, seizures, loss of consciousness, weakness and headaches.  Endo/Heme/Allergies:  Positive for environmental allergies.  Psychiatric/Behavioral:  The patient is nervous/anxious. The patient does not have insomnia.       Objective:     BP 128/66 (BP Location: Left Arm, Patient Position: Sitting, Cuff Size: Large)   Pulse 68   Temp (!) 97 F (36.1 C) (Tympanic)   SpO2 99%    Physical Exam Vitals and nursing note reviewed.  Constitutional:      General: She is awake. She is not in acute distress.    Appearance: Normal appearance. She is well-developed, well-groomed and overweight. She is not ill-appearing, toxic-appearing or diaphoretic.  HENT:     Head: Normocephalic and atraumatic.     Jaw: There is normal jaw occlusion.     Salivary Glands: Right salivary gland is not diffusely enlarged or tender. Left salivary gland is not diffusely enlarged or tender.     Right Ear: Hearing and external ear normal. No decreased hearing noted. No laceration, drainage, swelling or tenderness.     Left Ear: Hearing and external ear normal. No decreased hearing noted. No laceration, drainage, swelling or tenderness.     Nose: Nose normal. No congestion or rhinorrhea.     Right Nostril: No epistaxis.     Left Nostril: No epistaxis.     Right Turbinates: Not enlarged, swollen or pale.     Left Turbinates: Not enlarged, swollen or pale.     Right Sinus: No maxillary sinus tenderness or frontal sinus tenderness.     Left Sinus: No maxillary sinus tenderness  or frontal sinus tenderness.     Mouth/Throat:     Lips: Pink. No lesions.     Mouth: Mucous membranes are moist. No oral lesions or angioedema.     Dentition: No gum lesions.     Tongue: No lesions. Tongue does not deviate from midline.     Palate: No mass and lesions.     Pharynx: Oropharynx is clear. Uvula midline. No pharyngeal swelling,  oropharyngeal exudate, posterior oropharyngeal erythema or uvula swelling.     Tonsils: No tonsillar exudate.  Eyes:     General: Lids are normal. Vision grossly intact. Gaze aligned appropriately. Allergic shiner present. No scleral icterus.       Right eye: No discharge.        Left eye: No discharge.     Extraocular Movements: Extraocular movements intact.     Conjunctiva/sclera: Conjunctivae normal.     Pupils: Pupils are equal, round, and reactive to light.  Neck:     Trachea: Trachea and phonation normal.  Cardiovascular:     Rate and Rhythm: Normal rate and regular rhythm.     Pulses: Normal pulses.          Radial pulses are 2+ on the right side and 2+ on the left side.     Heart sounds: Normal heart sounds, S1 normal and S2 normal.  Pulmonary:     Effort: Pulmonary effort is normal. No tachypnea, bradypnea, accessory muscle usage or respiratory distress.     Breath sounds: Normal breath sounds and air entry. No stridor, decreased air movement or transmitted upper airway sounds. No decreased breath sounds, wheezing, rhonchi or rales.     Comments: Spoke full sentences without difficulty; no cough observed in exam room Chest:     Chest wall: No lacerations, deformity, swelling, tenderness, crepitus or edema.  Abdominal:     General: Abdomen is flat.     Palpations: Abdomen is soft.  Musculoskeletal:        General: Normal range of motion.     Right hand: Normal strength. Normal capillary refill.     Left hand: Normal strength. Normal capillary refill.     Cervical back: Normal range of motion and neck supple. No swelling, edema, deformity, erythema, signs of trauma, lacerations, rigidity, spasms, torticollis, tenderness or crepitus. No pain with movement or muscular tenderness. Normal range of motion.     Thoracic back: No swelling, edema, deformity, signs of trauma, lacerations, spasms or tenderness. Normal range of motion.     Right lower leg: No tenderness. No edema.      Left lower leg: No tenderness. No edema.  Lymphadenopathy:     Head:     Right side of head: No submental, submandibular, tonsillar, preauricular, posterior auricular or occipital adenopathy.     Left side of head: No submental, submandibular, tonsillar, preauricular, posterior auricular or occipital adenopathy.     Cervical: No cervical adenopathy.     Right cervical: No superficial, deep or posterior cervical adenopathy.    Left cervical: No superficial, deep or posterior cervical adenopathy.  Skin:    General: Skin is warm and dry.     Capillary Refill: Capillary refill takes less than 2 seconds.     Coloration: Skin is not ashen, cyanotic, jaundiced, mottled, pale or sallow.     Findings: No abrasion, bruising, burn, ecchymosis, erythema, signs of injury, laceration, lesion, petechiae, rash or wound.     Nails: There is no clubbing.  Neurological:  General: No focal deficit present.     Mental Status: She is alert and oriented to person, place, and time. Mental status is at baseline.     GCS: GCS eye subscore is 4. GCS verbal subscore is 5. GCS motor subscore is 6.     Cranial Nerves: No cranial nerve deficit, dysarthria or facial asymmetry.     Motor: Motor function is intact. No weakness, tremor, atrophy, abnormal muscle tone or seizure activity.     Coordination: Coordination is intact. Coordination normal.     Gait: Gait is intact. Gait normal.     Comments: In/out of chair  and on/off exam table without difficulty; gait sure and steady in clinic; bilateral hand grasp equal 5/5  Psychiatric:        Attention and Perception: Attention and perception normal.        Mood and Affect: Affect normal. Mood is anxious.        Speech: Speech normal.        Behavior: Behavior normal. Behavior is not agitated. Behavior is cooperative.        Thought Content: Thought content normal.        Cognition and Memory: Cognition and memory normal.        Judgment: Judgment normal.      No  results found for any visits on 04/21/24.    The 10-year ASCVD risk score (Arnett DK, et al., 2019) is: 6.2% Last PCM visit 01/21/24 reviewed in care everywhere Comp Metabolic Panel Reviewed date:01/21/2024 05:37:07 PM Interpretation:Normal Performing Lab: Notes/Report: Testing Performed at: [facility], [address], [address], [place], [place] [address]  Glucose 86 70-99 mg/dL    BUN 17 3-73 mg/dL    Creatinine 9.24 9.39-8.69 mg/dl    [id number] 92 >39 calc Stage 1 > 90 ML/Min plus Albuminuria;Stage 2 60-89 ML/MIN;Stage 3 30-59 ML/MIN;Stage 4 15-29 ML/MIN;Stage 5 <15 ML/MIN  Sodium 137 136-145 mmol/L    Potassium 4.1 3.5-5.5 mmol/L    Chloride 97 98-107 mmol/L    CO2 29 22-32 mmol/L    Anion Gap 15.7 6.0-20.0 mmol/L    Calcium  9.8 8.6-10.3 mg/dL    CA-corrected 0.73 1.39-89.69 mg/dL    Protein, Total 6.9 3.9-1.6 g/dL    Albumin 4.7 6.5-5.1 g/dL    TBIL 0.6 9.6-8.9 mg/dL    ALP 70 61-873 U/L    AST 22 0-39 U/L    ALT 40 0-52 U/L    Hemoglobin A1c Reviewed date:01/21/2024 05:36:21 PM Interpretation:6.5 Performing Lab: Notes/Report: Testing Performed at: [facility], [address], [place], [place] [address]  eAG 140      Hgb A1c 6.5 4.8-5.6 % Prediabetes: 5.7-6.4%  Diabetes: >/= 6.5%   Microalbumin Panel Reviewed date:01/21/2024 05:37:23 PM Interpretation:No protein Performing Lab: Notes/Report: Testing Performed at: [facility], [address], [address], [place], [place] [address]  MA/CR ratio <24.2 0.0-30.0 mg/G    UMA <0.7      UCR 29       REASON FOR VISIT  F/U HTN/DM, pt gets lab work at her employment but says it has not been done this time Medications Reconcile with Patient's ChartMedications Medication SIG (Take, Route, Frequency, Duration) Notes Start Date End Date Status  hydroCHLOROthiazide  25 MG 1 tablet Orally q AM   08/01/2015   Active  Veozah  45 MG 1 tablet Orally Once a day; Duration: 30 days   10/29/2023   Active  Lisinopril  10  MG 1 tablet Orally Once a day  04/23/2011   Active  One A Day Women 50 Plus - 1 tablet Orally once a day       Active  One Touch Verio * For use when checking blood glucose finger stick Once daily, alternating AM and PM, before meals; Duration: 90 days   02/17/2014   Active  Ozempic  (1 MG/DOSE) 4 MG/3ML 1 mg Subcutaneous once a week; Duration: 90 days   04/11/2022   Active  Turmeric         Active  Atorvastatin  Calcium  40 MG 1 tablet Orally Once a day   02/17/2015   Active  Fish Oil 1000 MG 3 capsules Orally once a day       Active  Omeprazole  40 MG 1 capsule Orally Once a day       Active  Aspir-81 81 MG 1 tablet Orally Once a day   11/29/2011   Active  Calcium  + D         Active  metFORMIN  HCl 1000 MG 1 tablet with a meal Orally twice a day   12/17/2008   Active   Social History  Tobacco Use: Social History Social History Observation Description Date  Details (start date - stop date) Former Smoker NA - NA   Tobacco use: Social History Question Answer Notes  cigarettes: Former smoker     Vital Signs  Vital Signs Height 64.5 in 01/20/2024  Weight 221.8 lbs 01/20/2024  BMI 37.48 kg/m2 01/20/2024  Blood pressure systolic 101 mm Hg 01/20/2024  Blood pressure diastolic 77 mm Hg 01/20/2024  2nd reading pulse 76   Encounters  Encounters Encounter Location Date Provider Diagnosis  [facility] [address] [place], [place] [address] 01/20/2024 CANDACE SMITH Type 2 diabetes mellitus with other specified complication E11.69 ; Essential (primary) hypertension I10 ; Mixed hyperlipidemia E78.2 ; Gastro-esophageal reflux disease without esophagitis K21.9 and Obesity (BMI 30-39.9) E66.9   Assessments  Assessments Encounter Date Diagnosis (ICD Code) Assessment Notes Treatment Notes Treatment Clinical Notes Section Notes  01/20/2024 Type 2 diabetes mellitus with other specified complication (ICD-10 - E11.69) recommend recheck lab work and continue on the current medications  continue on the metformin  two tabs twice a day and use of the ozempic  1 mg once a week continue to follow your Blood sugars recheck lab work today      01/20/2024 Essential (primary) hypertension (ICD-10 - I10) pt has low BP readings and will decrease in her medications and follow BP readings recommend continue on the lisinopril  10 mg once a day and may wean on the hctz start with 1/2 tab once a day for one week and then discontinue this altogether follow your BP readings and a low salt diet      01/20/2024 Mixed hyperlipidemia (ICD-10 - E78.2) stable at present time continue to follow continue on a low fat diet and may stop the zetia  and continue on the atorvastatin  repeat your lipid profile 8 weeks after stopping the zetia  and see how this is doing goal is LDL less than 100 and closer to 70      01/20/2024 Gastro-esophageal reflux disease without esophagitis (ICD-10 - K21.9) continue on the PPI stable continue on the omeprazole  once a day and avoid foods that worsen reflux      01/20/2024 Obesity (BMI 30-39.9) (ICD-10 - E66.9) struggles with obesity but working on this continue to work on weight loss good diet with good exercise need weight training while you are losing weight with the ozempic   Plan Of Treatment  Medication Plan Of Treatment Medication Name Sig Start Date Stop Date Notes  hydroCHLOROthiazide  25 MG 1 tablet Orally q AM 08/01/2015      Lisinopril  10 MG 1 tablet Orally Once a day 04/23/2011      Ozempic  (1 MG/DOSE) 4 MG/3ML 1 mg Subcutaneous once a week; Duration: 90 days 04/11/2022      Ezetimibe  10 mg Take 1 tablet (10 mg total) by mouth daily. oral once a day        Atorvastatin  Calcium  40 MG 1 tablet Orally Once a day 02/17/2015      Omeprazole  40 MG 1 capsule Orally Once a day        metFORMIN  HCl 1000 MG 1 tablet with a meal Orally twice a day 12/17/2008       Treatment Notes Plan Of Treatment Assessment Notes  Type 2 diabetes mellitus with other specified  complication continue on the metformin  two tabs twice a day and use of the ozempic  1 mg once a week continue to follow your Blood sugars recheck lab work today  Essential (primary) hypertension recommend continue on the lisinopril  10 mg once a day and may wean on the hctz start with 1/2 tab once a day for one week and then discontinue this altogether follow your BP readings and a low salt diet  Mixed hyperlipidemia continue on a low fat diet and may stop the zetia  and continue on the atorvastatin  repeat your lipid profile 8 weeks after stopping the zetia  and see how this is doing goal is LDL less than 100 and closer to 70  Gastro-esophageal reflux disease without esophagitis continue on the omeprazole  once a day and avoid foods that worsen reflux  Obesity (BMI 30-39.9) continue to work on weight loss good diet with good exercise need weight training while you are losing weight with the ozempic    Next Appt Plan Of Treatment Details  Follow Up: keep scheduled appt, Reason:  Provider Name:SIMONE AUTRY LOTT, 05/18/2024 04:00:00 PM, [address], [place], [place], [address], [phone]  Provider Name:CANDACE SMITH, 08/03/2024 02:45:00 PM, [address], [place], [place], [address], [phone]  Provider Name:ALEXANDRA ORISCHAK, 09/24/2024 03:00:00 PM, [address], [place], [place], [address], [phone]     Assessment & Plan:   Problem List Items Addressed This Visit       Cardiovascular and Mediastinum   Essential (primary) hypertension     Respiratory   Allergic rhinitis     Digestive   Gastro-esophageal reflux disease without esophagitis     Other   Mixed hyperlipidemia   Other Visit Diagnoses       Type 2 diabetes mellitus without complication, without long-term current use of insulin (HCC)    -  Primary     Discussed with patient weather change over the weekend from 100% humidity 80s to 50s/70% humidity could be cause of breathing  symptoms especially since she has forgotten to take antihistamine and not used her inhaler when feeling chest tightness as was concerned if she was having heart problems using inhaler could worsen heart problems.  Discussed if having tachycardia e.g. heart rate greater than 100 inhaler could increase pulse higher but if normal pulse okay to use inhaler if chest tightness/wheezing 2 puffs po q4-6h  Discussed ragweed season still high pollen and other viruses are circulating in community e.g. covid/cold/flu/rsv/adenovirus/GI bug that could be increasing congestion and post nasal drip also.  If new or worsening symptoms to  include fever/body aches/cough then consider home covid testing  Use nasal saline/flonase /antihistamine of choice 10mg  po daily if zyrtec/claritin .  Given 1 claritin  from clinic stock for use today at work  Patient may use normal saline nasal spray 2 sprays each nostril q2h wa as needed. flonase  50mcg 1 spray each nostril BID #1 RF6 sent to her pharmacy of choice.  Patient denied personal or family history of ENT cancer.  OTC antihistamine of choice claritin /zyrtec 10mg  po daily.  Avoid triggers if possible.  Shower prior to bedtime if exposed to triggers.  If allergic dust/dust mites recommend mattress/pillow covers/encasements; washing linens, vacuuming, sweeping, dusting weekly.  Call or return to clinic as needed if these symptoms worsen or fail to improve as anticipated.   Patient verbalized understanding of instructions, agreed with plan of care and had no further questions at this time.  P2:  Avoidance and hand washing.   Dispensed to patient 180 tabs metformin  1000mg  po BID last Hgba1c at goal 6.5 with [facility] Jul 2025 [facility].  PCM stopping zetia  but she is to continue atorvastatin  40mg  po daily #90 RF0 and repeat lipids 8 weeks after stopping zetia  completely.  Patient weaning off hydrochlorothiazide  25mg  po daily and taking only prn at this time.  Discussed if BP greater than 130/80 consider  taking hydrochlorothiazide .  continue lisinopril  10mg  po daily #90 renal and liver function were stable Jul 2025 [facility] labs.  Continue omeprazole  40mg  DR po daily.  If having worsening chest pain despite taking her medications she should see provider for re-evaluation.  [facility] does not have EKG machine or troponin testing available therefore PCM/UC/ER recommended.  May come to clinic for VS/lung/pulse checks as needed or glucose checks since with weight loss PCM has been adjusting her chronic medications.  Hypoglycemia can increase anxiety/worsen anxiety symptoms.  Ensure eating regular meats with some protein and carbohydrate each meal.  Avoid dehydration drink water to keep urine pale yellow clear and voiding every 3-4 hours while awake.  If having leg swelling after discontinuation of Hydrochlorothiazide  notified clinic staff or PCM.  Patient verbalized understanding information/instructions, agreed with plan of care and had no further questions at this time.  Return if symptoms worsen or fail to improve.    Ellouise DELENA Hope, NP

## 2024-04-21 NOTE — Patient Instructions (Signed)
 Shortness of Breath, Adult Shortness of breath is when a person has trouble breathing or when a person feels like she or he is having trouble breathing in enough air. Shortness of breath could be a sign of a medical problem. Follow these instructions at home:  Pollutants Do not use any products that contain nicotine or tobacco. These products include cigarettes, chewing tobacco, and vaping devices, such as e-cigarettes. This also includes cigars and pipes. If you need help quitting, ask your health care provider. Avoid things that can irritate your airways, including: Smoke. This includes campfire smoke, forest fire smoke, and secondhand smoke from tobacco products. Do not smoke or allow others to smoke in your home. Mold. Dust. Air pollution. Chemical fumes. Things that can give you an allergic reaction (allergens) if you have allergies. Common allergens include pollen from grasses or trees and animal dander. Keep your living space clean and free of mold and dust. General instructions Pay attention to any changes in your symptoms. Take over-the-counter and prescription medicines only as told by your health care provider. This includes oxygen therapy and inhaled medicines. Rest as needed. Return to your normal activities as told by your health care provider. Ask your health care provider what activities are safe for you. Keep all follow-up visits. This is important. Contact a health care provider if: Your condition does not improve as soon as expected. You have a hard time doing your normal activities, even after you rest. You have new symptoms. You cannot walk up stairs or exercise the way that you normally do. Get help right away if: Your shortness of breath gets worse. You have shortness of breath when you are resting. You feel light-headed or you faint. You have a cough that is not controlled with medicines. You cough up blood. You have pain with breathing. You have pain in your  chest, arms, shoulders, or abdomen. You have a fever. These symptoms may be an emergency. Get help right away. Call 911. Do not wait to see if the symptoms will go away. Do not drive yourself to the hospital. Summary Shortness of breath is when a person has trouble breathing enough air. It can be a sign of a medical problem. Avoid things that irritate your lungs, such as smoking, pollution, mold, and dust. Pay attention to changes in your symptoms and contact your health care provider if you have a hard time completing daily activities because of shortness of breath. This information is not intended to replace advice given to you by your health care provider. Make sure you discuss any questions you have with your health care provider. Document Revised: 02/11/2021 Document Reviewed: 02/11/2021 Elsevier Patient Education  2024 [employer]Nonspecific Chest Pain, Adult Chest pain is an uncomfortable, tight, or painful feeling in the chest. The pain can feel like a crushing, aching, or squeezing pressure. A person can feel a burning or tingling sensation. Chest pain can also be felt in your back, neck, jaw, shoulder, or arm. This pain can be worse when you move, sneeze, or take a deep breath. Chest pain can be caused by a condition that is life-threatening. This must be treated right away. It can also be caused by something that is not life-threatening. If you have chest pain, it can be hard to know the difference, so it is important to get help right away to make sure that you do not have a serious condition. Some life-threatening causes of chest pain include: Heart attack. A tear in the body's  main blood vessel (aortic dissection). Inflammation around your heart (pericarditis). A problem in the lungs, such as a blood clot (pulmonary embolism) or a collapsed lung (pneumothorax). Some non life-threatening causes of chest pain include: Heartburn. Anxiety or stress. Damage to the bones, muscles,  and cartilage that make up your chest wall. Pneumonia or bronchitis. Shingles infection (varicella-zoster virus). Your chest pain may come and go. It may also be constant. Your health care provider will do tests and other studies to find the cause of your pain. Treatment will depend on the cause of your chest pain. Follow these instructions at home: Medicines Take over-the-counter and prescription medicines only as told by your health care provider. If you were prescribed an antibiotic medicine, take it as told by your health care provider. Do not stop taking the antibiotic even if you start to feel better. Activity Avoid any activities that cause chest pain. Do not lift anything that is heavier than 10 lb (4.5 kg), or the limit that you are told, until your health care provider says that it is safe. Rest as directed by your health care provider. Return to your normal activities only as told by your health care provider. Ask your health care provider what activities are safe for you. Lifestyle     Do not use any products that contain nicotine or tobacco, such as cigarettes, e-cigarettes, and chewing tobacco. If you need help quitting, ask your health care provider. Do not drink alcohol. Make healthy lifestyle changes as recommended. These may include: Getting regular exercise. Ask your health care provider to suggest some exercises that are safe for you. Eating a heart-healthy diet. This includes plenty of fresh fruits and vegetables, whole grains, low-fat (lean) protein, and low-fat dairy products. A dietitian can help you find healthy eating options. Maintaining a healthy weight. Managing any other health conditions you may have, such as high blood pressure (hypertension) or diabetes. Reducing stress, such as with yoga or relaxation techniques. General instructions Pay attention to any changes in your symptoms. It is up to you to get the results of any tests that were done. Ask your  health care provider, or the department that is doing the tests, when your results will be ready. Keep all follow-up visits as told by your health care provider. This is important. You may be asked to go for further testing if your chest pain does not go away. Contact a health care provider if: Your chest pain does not go away. You feel depressed. You have a fever. You notice changes in your symptoms or develop new symptoms. Get help right away if: Your chest pain gets worse. You have a cough that gets worse, or you cough up blood. You have severe pain in your abdomen. You faint. You have sudden, unexplained chest discomfort. You have sudden, unexplained discomfort in your arms, back, neck, or jaw. You have shortness of breath at any time. You suddenly start to sweat, or your skin gets clammy. You feel nausea or you vomit. You suddenly feel lightheaded or dizzy. You have severe weakness, or unexplained weakness or fatigue. Your heart begins to beat quickly, or it feels like it is skipping beats. These symptoms may represent a serious problem that is an emergency. Do not wait to see if the symptoms will go away. Get medical help right away. Call your local emergency services (911 in the [place] Do not drive yourself to the hospital. Summary Chest pain can be caused by a condition that is serious  and requires urgent treatment. It may also be caused by something that is not life-threatening. Your health care provider may do lab tests and other studies to find the cause of your pain. Follow your health care provider's instructions on taking medicines, making lifestyle changes, and getting emergency treatment if symptoms become worse. Keep all follow-up visits as told by your health care provider. This includes visits for any further testing if your chest pain does not go away. This information is not intended to replace advice given to you by your health care provider. Make sure you discuss any  questions you have with your health care provider. Document Revised: 05/10/2022 Document Reviewed: 05/10/2022 Elsevier Patient Education  2024 [employer].

## 2024-04-22 ENCOUNTER — Other Ambulatory Visit: Payer: Self-pay

## 2024-04-22 ENCOUNTER — Encounter: Payer: Self-pay | Admitting: Pharmacist

## 2024-04-22 ENCOUNTER — Other Ambulatory Visit (HOSPITAL_COMMUNITY): Payer: Self-pay

## 2024-04-22 NOTE — Telephone Encounter (Signed)
 HR Team and supervisor were notified excused absence for communicable disease 21 & 22 Aug

## 2024-05-06 ENCOUNTER — Encounter: Payer: Self-pay | Admitting: Internal Medicine

## 2024-05-06 ENCOUNTER — Other Ambulatory Visit (HOSPITAL_COMMUNITY): Payer: Self-pay

## 2024-05-06 ENCOUNTER — Other Ambulatory Visit: Payer: Self-pay

## 2024-05-06 ENCOUNTER — Ambulatory Visit: Attending: Internal Medicine | Admitting: Internal Medicine

## 2024-05-06 VITALS — BP 128/76 | HR 91 | Ht 66.0 in | Wt 223.4 lb

## 2024-05-06 DIAGNOSIS — R0602 Shortness of breath: Secondary | ICD-10-CM

## 2024-05-06 DIAGNOSIS — I4510 Unspecified right bundle-branch block: Secondary | ICD-10-CM

## 2024-05-06 DIAGNOSIS — E1169 Type 2 diabetes mellitus with other specified complication: Secondary | ICD-10-CM

## 2024-05-06 DIAGNOSIS — E785 Hyperlipidemia, unspecified: Secondary | ICD-10-CM

## 2024-05-06 DIAGNOSIS — I10 Essential (primary) hypertension: Secondary | ICD-10-CM | POA: Diagnosis not present

## 2024-05-06 DIAGNOSIS — R079 Chest pain, unspecified: Secondary | ICD-10-CM

## 2024-05-06 MED ORDER — METOPROLOL SUCCINATE ER 25 MG PO TB24
25.0000 mg | ORAL_TABLET | Freq: Every day | ORAL | 3 refills | Status: AC
  Filled 2024-05-06: qty 90, 90d supply, fill #0
  Filled 2024-07-21: qty 90, 90d supply, fill #1
  Filled 2024-07-22: qty 90, 90d supply, fill #2

## 2024-05-06 NOTE — Progress Notes (Unsigned)
 Cardiology Office Note:  .   Date:  05/08/2024  ID:  Christine Rojas, DOB [birth year], MRN [id number] PCP: Claudene Pellet, MD  [facility] Providers Cardiologist:  None     History of Present Illness: .   Discussed the use of AI scribe software for clinical note transcription with the patient, who gave verbal consent to proceed.  Christine Rojas is a 58 y.o. female with history of hypertension, hyperlipidemia, type 2 diabetes mellitus, and GERD, who has been referred for evaluation of intermittent dyspnea and right bundle branch block. Ms. Massingale experiences shortness of breath, particularly during the fall season, initially attributing it to allergies. Despite using an inhaler, Flonase , and allergy medications, her symptoms persist. The shortness of breath occurs a few times a week and is worse this year than in previous years. She also endorses chest pain described as brief and migratory, occurring at rest rather than during exertion. The pain does not radiate to her neck, back, or jaw and lasts only a second. She also reports episodes of heart racing or fluttering, particularly when she feels short of breath, and has felt lightheaded and off balance, especially after sudden movements or changes in position. Her family history is significant for heart disease; her mother and grandmother both died at age 26 from heart-related issues. Her mother had heart disease, was in hospice with 10% heart function, and had undergone a triple bypass surgery with stent placements. In 2008, she underwent heart testing with Dr. Blanca due to anxiety and shortness of breath following her mother's death, but she recalls no significant findings from those tests.     ROS: See HPI  Studies Reviewed: SABRA   EKG Interpretation Date/Time:  Wednesday May 06 2024 14:48:05 EDT Ventricular Rate:  91 PR Interval:  204 QRS Duration:  134 QT Interval:  400 QTC Calculation: 492 R Axis:   -76  Text Interpretation: Normal  sinus rhythm Left axis deviation Right bundle branch block Inferior infarct , age undetermined Abnormal ECG No previous ECGs available Confirmed by Jaunita Mikels, Lonni [phone]) on 05/08/2024 2:54:53 PM    Risk Assessment/Calculations:          Physical Exam:   VS:  BP 128/76   Pulse 91   Ht 5' 6 (1.676 m)   Wt 223 lb 6.4 oz (101.3 kg)   SpO2 99%   BMI 36.06 kg/m    Wt Readings from Last 3 Encounters:  05/06/24 223 lb 6.4 oz (101.3 kg)  09/16/23 225 lb 5 oz (102.2 kg)  03/06/23 217 lb (98.4 kg)    General:  NAD. Neck: No JVD or HJR. Lungs: Clear to auscultation bilaterally without wheezes or crackles. Heart: Regular rate and rhythm without murmurs, rubs, or gallops. Abdomen: Soft, nontender, nondistended. Extremities: No lower extremity edema.  ASSESSMENT AND PLAN: .    Shortness of breath and chest pain: Ms. Weng reports long history of sporadic shortness of breath, most commonly in the fall.  She has attributed this in the past to seasonal allergies, though her typical treatments have not helped as much this year.  Her random bouts of brief chest pain are nonspecific.  However, given her family history of cardiovascular disease as well as her abnormal EKG demonstrating right bundle branch block and possible inferior infarct, we have agreed to obtain an echocardiogram.  If no significant structural abnormalities are identified, we will then proceed with a myocardial PET/CT.  Alternatively, if there is evidence of reduced LVEF and/or regional wall  motion abnormality, catheterization would need to be considered instead.  In the meantime, we have agreed to initiate metoprolol succinate 25 mg daily.  I will defer adding an aspirin with her history of peptic ulcer disease.  Right bundle branch block and palpitations: Chronicity of right bundle branch block is uncertain.  Ms. Algeo does not describe any symptoms of high-grade AV block.  As above, we will obtain an echocardiogram.  We have also  agreed to initiate metoprolol succinate 25 mg daily.  Hypertension: Blood pressure well-controlled today.  As above, we will add low-dose metoprolol to see if that helps with some of her dyspnea, chest pain, and palpitations.  Hyperlipidemia associated with type 2 diabetes mellitus: Continue atorvastatin  40 mg daily for target LDL less than 100 (less than 70 if there is evidence of ASCVD on upcoming testing).  Ongoing management of DM per Dr. Claudene.    Informed Consent   Shared Decision Making/Informed Consent The risks [chest pain, shortness of breath, cardiac arrhythmias, dizziness, blood pressure fluctuations, myocardial infarction, stroke/transient ischemic attack, nausea, vomiting, allergic reaction, radiation exposure, metallic taste sensation and life-threatening complications (estimated to be 1 in 10,000)], benefits (risk stratification, diagnosing coronary artery disease, treatment guidance) and alternatives of a cardiac PET stress test were discussed in detail with Ms. Briel and she agrees to proceed.     Dispo: Return to clinic in 2 months.  Signed, Lonni Hanson, MD

## 2024-05-06 NOTE — Patient Instructions (Signed)
 Medication Instructions:  Your physician recommends the following medication changes.  START TAKING: Metoprolol Succinate 25 mg by mouth daily   *If you need a refill on your cardiac medications before your next appointment, please call your pharmacy*  Lab Work: No labs ordered today    Testing/Procedures: Your physician has requested that you have an echocardiogram. Echocardiography is a painless test that uses sound waves to create images of your heart. It provides your doctor with information about the size and shape of your heart and how well your heart's chambers and valves are working.   You may receive an ultrasound enhancing agent through an IV if needed to better visualize your heart during the echo. This procedure takes approximately one hour.  There are no restrictions for this procedure.  This will take place at [address] [facility]) [address], [place] [address]  Please note: We ask at that you not bring children with you during ultrasound (echo/ vascular) testing. Due to room size and safety concerns, children are not allowed in the ultrasound rooms during exams. Our front office staff cannot provide observation of children in our lobby area while testing is being conducted. An adult accompanying a patient to their appointment will only be allowed in the ultrasound room at the discretion of the ultrasound technician under special circumstances. We apologize for any inconvenience.   Follow-Up: At [facility], you and your health needs are our priority.  As part of our continuing mission to provide you with exceptional heart care, our providers are all part of one team.  This team includes your primary Cardiologist (physician) and Advanced Practice Providers or APPs (Physician Assistants and Nurse Practitioners) who all work together to provide you with the care you need, when you need it.  Your next appointment:   2 month(s)  Provider:   You may see  Lonni Hanson, MD or one of the following Advanced Practice Providers on your designated Care Team:   Lonni Meager, NP Lesley Maffucci, PA-C Bernardino Bring, PA-C Cadence [place], PA-C Tylene Lunch, NP Barnie Hila, NP

## 2024-05-08 ENCOUNTER — Encounter: Payer: Self-pay | Admitting: Internal Medicine

## 2024-05-08 DIAGNOSIS — I4510 Unspecified right bundle-branch block: Secondary | ICD-10-CM | POA: Insufficient documentation

## 2024-05-08 DIAGNOSIS — R0602 Shortness of breath: Secondary | ICD-10-CM | POA: Insufficient documentation

## 2024-05-08 DIAGNOSIS — R079 Chest pain, unspecified: Secondary | ICD-10-CM | POA: Insufficient documentation

## 2024-05-12 ENCOUNTER — Other Ambulatory Visit (HOSPITAL_COMMUNITY): Payer: Self-pay

## 2024-05-14 ENCOUNTER — Ambulatory Visit: Payer: Self-pay | Admitting: Internal Medicine

## 2024-05-14 ENCOUNTER — Ambulatory Visit: Attending: Internal Medicine

## 2024-05-14 DIAGNOSIS — R079 Chest pain, unspecified: Secondary | ICD-10-CM | POA: Diagnosis not present

## 2024-05-14 DIAGNOSIS — R0602 Shortness of breath: Secondary | ICD-10-CM | POA: Diagnosis not present

## 2024-05-14 DIAGNOSIS — R9431 Abnormal electrocardiogram [ECG] [EKG]: Secondary | ICD-10-CM

## 2024-05-14 DIAGNOSIS — I4510 Unspecified right bundle-branch block: Secondary | ICD-10-CM

## 2024-05-14 LAB — ECHOCARDIOGRAM COMPLETE
AR max vel: 2.1 cm2
AV Area VTI: 2.21 cm2
AV Area mean vel: 2.14 cm2
AV Mean grad: 4 mmHg
AV Peak grad: 7.7 mmHg
Ao pk vel: 1.39 m/s
Area-P 1/2: 3.77 cm2
MV M vel: 4.3 m/s
MV Peak grad: 74 mmHg
S' Lateral: 3 cm

## 2024-05-19 ENCOUNTER — Ambulatory Visit: Admitting: Cardiovascular Disease

## 2024-05-25 ENCOUNTER — Ambulatory Visit: Admitting: General Practice

## 2024-05-25 ENCOUNTER — Other Ambulatory Visit (HOSPITAL_COMMUNITY): Payer: Self-pay

## 2024-05-25 NOTE — Progress Notes (Signed)
 Employee presented to the clinic this am requesting RN to go over medications to clarify the names, purposes and to make sure everything is correct. Reports getting medications from two different pharmacies, does not think they overlap in 90 day refills. Employee states she continues to take Zetia  and Atorvastatin  as ordered by [facility], hormonal therapy has been changed due to side effects and a few medications & OTCs are now taken as needed.  VSS, RN discussed medications employee currently takes and no longer takes. Clarified generic and name brand medications to avoid duplications. Will discuss with clinic Nurse Practitioner for completion of medication review.

## 2024-05-27 ENCOUNTER — Other Ambulatory Visit (HOSPITAL_COMMUNITY): Payer: Self-pay | Admitting: Internal Medicine

## 2024-05-27 ENCOUNTER — Encounter (HOSPITAL_COMMUNITY)
Admission: RE | Admit: 2024-05-27 | Discharge: 2024-05-27 | Disposition: A | Discharge status: Home / Self Care | Source: Ambulatory Visit | Attending: Internal Medicine | Admitting: Internal Medicine

## 2024-05-27 DIAGNOSIS — R9431 Abnormal electrocardiogram [ECG] [EKG]: Secondary | ICD-10-CM

## 2024-05-27 LAB — NM PET CT CARDIAC PERFUSION MULTI W/ABSOLUTE BLOODFLOW
MBFR: 2.85
Nuc Rest EF: 69 %
Nuc Stress EF: 74 %
Rest MBF: 1.08 ml/g/min
Rest Nuclear Isotope Dose: 25.6 mCi
ST Depression (mm): 0 mm
Stress MBF: 3.08 ml/g/min
Stress Nuclear Isotope Dose: 25.6 mCi
TID: 1.12

## 2024-05-27 MED ORDER — RUBIDIUM RB82 GENERATOR (RUBYFILL)
25.5500 | PACK | Freq: Once | INTRAVENOUS | Status: AC
  Administered 2024-05-27: 25.55 via INTRAVENOUS

## 2024-05-27 MED ORDER — REGADENOSON 0.4 MG/5ML IV SOLN
INTRAVENOUS | Status: AC
  Filled 2024-05-27: qty 5

## 2024-05-27 MED ORDER — RUBIDIUM RB82 GENERATOR (RUBYFILL)
25.6000 | PACK | Freq: Once | INTRAVENOUS | Status: AC
  Administered 2024-05-27: 25.6 via INTRAVENOUS

## 2024-05-27 MED ORDER — REGADENOSON 0.4 MG/5ML IV SOLN
0.4000 mg | Freq: Once | INTRAVENOUS | Status: AC
  Administered 2024-05-27: 0.4 mg via INTRAVENOUS

## 2024-05-28 ENCOUNTER — Encounter: Payer: Self-pay | Admitting: Registered Nurse

## 2024-05-28 ENCOUNTER — Ambulatory Visit: Payer: Self-pay | Admitting: Internal Medicine

## 2024-05-28 ENCOUNTER — Ambulatory Visit: Admitting: Registered Nurse

## 2024-05-28 VITALS — Resp 16

## 2024-05-28 DIAGNOSIS — R599 Enlarged lymph nodes, unspecified: Secondary | ICD-10-CM | POA: Insufficient documentation

## 2024-05-28 DIAGNOSIS — K219 Gastro-esophageal reflux disease without esophagitis: Secondary | ICD-10-CM | POA: Insufficient documentation

## 2024-05-28 DIAGNOSIS — M47814 Spondylosis without myelopathy or radiculopathy, thoracic region: Secondary | ICD-10-CM | POA: Insufficient documentation

## 2024-05-28 NOTE — Progress Notes (Signed)
 Seen 1120 encounter all documentation completed on that appt.

## 2024-05-28 NOTE — Patient Instructions (Addendum)
 Right Bundle Branch Block  Right bundle branch block (RBBB) is a problem with the way that electrical impulses pass through the heart (electrical conduction abnormality). The heart needs an electrical signal to beat like it should. The electrical signal for a heartbeat starts in the upper chambers of the heart (atria) and then goes to the two lower chambers (left and rightventricles). An RBBB is a block of the pathway that carries the signal to the right ventricle. If you have RBBB, the right side of your heart beats a little slower than the left side. RBBB may be a warning sign of heart disease or a lung problem. What are the causes? RBBB may be caused by: A heart attack. This is also called a myocardial infarction. Congenital heart disease. This is a heart problem that you are born with. A pulmonary embolism. This is a blood clot that has moved into your lung. Myocarditis. This is an infection of the heart muscle. High blood pressure. In some cases, the cause may not be known. What increases the risk? You are more likely to get RBBB if: You are female. You are 57 years of age or older. You have heart disease. You have had a heart attack or heart surgery. You have a bigger heart than you should (enlarged heart). There is a hole in the walls between the chambers of your heart (septal defect). What are the signs or symptoms? In most cases, RBBB does not cause symptoms. How is this diagnosed? RBBB may be diagnosed based on an electrocardiogram (ECG). It is often found when an ECG is done as part of a physical exam. You may also have imaging tests done. These may include: Chest X-rays. Echocardiogram. How is this treated? You may not need treatment if you do not have symptoms or any other heart problems. But you may need to see your health care provider more often to make sure you do not get other heart or lung problems. You may also need treatment if a condition is causing the RBBB. Follow  these instructions at home: Lifestyle  Ask your provider about what you may eat and drink. Eat foods that are good for your heart and stay at a healthy weight. Work with a diet and hospital doctor (dietitian) to make the best eating plan for you. Activity Get regular exercise as told by your provider. Return to your normal activities as told by your provider. Ask your provider what activities are safe for you. General instructions Take over-the-counter and prescription medicines only as told by your provider. Do not use any products that contain nicotine or tobacco. These products include cigarettes, chewing tobacco, and vaping devices, such as e-cigarettes. If you need help quitting, ask your provider. Contact a health care provider if: You are light-headed. You faint. Get help right away if: You have chest pain. You have trouble breathing. These symptoms may be an emergency. Get help right away. Call 911. Do not wait to see if the symptoms will go away. Do not drive yourself to the hospital. This information is not intended to replace advice given to you by your health care provider. Make sure you discuss any questions you have with your health care provider. Document Revised: 03/27/2022 Document Reviewed: 03/27/2022 Elsevier Patient Education  2024 [employer]  Lymphadenopathy  Lymphadenopathy is when your lymph glands are swollen or larger than normal.  Lymph glands, also called lymph nodes, are clumps of tissue. They filter germs and waste from tissues in your body  to your bloodstream. They're part of your body's defense system, or immune system. Lymphadenopathy has different causes, like infection, autoimmune disease, and cancer. Lymphadenopathy can happen wherever you have lymph nodes. The type you have depends on which nodes it's in, such as: Cervical lymphadenopathy. This is in the neck. Mediastinal lymphadenopathy. This is in the chest. Hilar lymphadenopathy. This is in  the lungs. Axillary lymphadenopathy. This is in the armpits. Inguinal lymphadenopathy. This is in the groin. Sometimes, fluid and cells that fight infection build up in your lymph nodes. This happens when your immune system reacts to germs or other substances that get into your body. This makes lymph nodes swell and get bigger. Treatment is based on what's thought to be the cause. Sometimes, lymph nodes don't go back to normal size after treatment. If yours don't, your health care provider may order tests to help learn why your glands are still swollen and big. Follow these instructions at home:  Take over-the-counter and prescription medicines only as told by your provider. If you were prescribed antibiotics, do not stop using them, even if you start to feel better. If told, apply heat to swollen lymph nodes as told by your provider. Use the heat source that your provider recommends, such as a moist heat pack or a heating pad. Place a towel between your skin and the heat source. Leave the heat on only for the time told by your provider to avoid injury. If your skin turns bright red, remove the heat right away to prevent burns. The risk of burns is higher if you cannot feel pain, heat, or cold. Check your swollen lymph nodes every day for changes. Check other places where you have lymph nodes as told. Check for changes such as: More swelling. Sudden growth in size. Redness or pain. Hardness. Contact a health care provider if: You have lymph nodes that: Are still swollen after 2 weeks. Have gotten bigger all of a sudden or the swelling spreads. Are red, painful, or hard. Fluid leaks from the skin near a swollen lymph node. You get a fever, chills, or night sweats. You feel tired. You have a sore throat. Your abdomen hurts. You lose weight without trying. This information is not intended to replace advice given to you by your health care provider. Make sure you discuss any questions you  have with your health care provider. Document Revised: 09/19/2022 Document Reviewed: 09/19/2022 Elsevier Patient Education  2024 [employer]Hiatal Hernia  A hiatal hernia occurs when part of the stomach slides above the muscle that separates the abdomen from the chest (diaphragm). A person can be born with a hiatal hernia (congenital), or it may develop over time. In almost all cases of hiatal hernia, only the top part of the stomach pushes through the diaphragm. Many people have a hiatal hernia with no symptoms. The larger the hernia, the more likely it is that you will have symptoms. In some cases, a hiatal hernia allows stomach acid to flow back into the tube that carries food from your mouth to your stomach (esophagus). This may cause heartburn symptoms. The development of heartburn symptoms may mean that you have a condition called gastroesophageal reflux disease (GERD). What are the causes? This condition is caused by a weakness in the opening (hiatus) where the esophagus passes through the diaphragm to attach to the upper part of the stomach. A person may be born with a weakness in the hiatus, or a weakness can develop over time. What increases the  risk? This condition is more likely to develop in: Older people. Age is a major risk factor for a hiatal hernia, especially if you are over the age of 48. Pregnant women. People who are overweight. People who have frequent constipation. What are the signs or symptoms? Symptoms of this condition usually develop in the form of GERD symptoms. Symptoms include: Heartburn. Upset stomach (indigestion). Trouble swallowing. Coughing or wheezing. Wheezing is making high-pitched whistling sounds when you breathe. Sore throat. Chest pain. Nausea and vomiting. How is this diagnosed? This condition may be diagnosed during testing for GERD. Tests that may be done include: X-rays of your stomach or chest. An upper gastrointestinal (GI) series. This is  an X-ray exam of your GI tract that is taken after you swallow a chalky liquid that shows up clearly on the X-ray. Endoscopy. This is a procedure to look into your stomach using a thin, flexible tube that has a tiny camera and light on the end of it. How is this treated? This condition may be treated by: Dietary and lifestyle changes to help reduce GERD symptoms. Medicines. These may include: Over-the-counter antacids. Medicines that make your stomach empty more quickly. Medicines that block the production of stomach acid (H2 blockers). Stronger medicines to reduce stomach acid (proton pump inhibitors). Surgery to repair the hernia, if other treatments are not helping. If you have no symptoms, you may not need treatment. Follow these instructions at home: Lifestyle and activity Do not use any products that contain nicotine or tobacco. These products include cigarettes, chewing tobacco, and vaping devices, such as e-cigarettes. If you need help quitting, ask your health care provider. Try to achieve and maintain a healthy body weight. Avoid putting pressure on your abdomen. Anything that puts pressure on your abdomen increases the amount of acid that may be pushed up into your esophagus. Avoid bending over, especially after eating. Raise the head of your bed by putting blocks under the legs. This keeps your head and esophagus higher than your stomach. Do not wear tight clothing around your chest or stomach. Try not to strain when having a bowel movement, when urinating, or when lifting heavy objects. Eating and drinking Avoid foods that can worsen GERD symptoms. These may include: Fatty foods, like fried foods. Citrus fruits, like oranges or lemon. Other foods and drinks that contain acid, like orange juice or tomatoes. Spicy food. Chocolate. Eat frequent small meals instead of three large meals a day. This helps prevent your stomach from getting too full. Eat slowly. Do not lie down  right after eating. Do not eat 1-2 hours before bed. Do not drink beverages with caffeine. These include cola, coffee, cocoa, and tea. Do not drink alcohol. General instructions Take over-the-counter and prescription medicines only as told by your health care provider. Keep all follow-up visits. Your health care provider will want to check that any new prescribed medicines are helping your symptoms. Contact a health care provider if: Your symptoms are not controlled with medicines or lifestyle changes. You are having trouble swallowing. You have coughing or wheezing that will not go away. Your pain is getting worse. Your pain spreads to your arms, neck, jaw, teeth, or back. You feel nauseous or you vomit. Get help right away if: You have shortness of breath. You vomit blood. You have bright red blood in your stools. You have black, tarry stools. These symptoms may be an emergency. Get help right away. Call 911. Do not wait to see if the symptoms will  go away. Do not drive yourself to the hospital. Summary A hiatal hernia occurs when part of the stomach slides above the muscle that separates the abdomen from the chest. A person may be born with a weakness in the hiatus, or a weakness can develop over time. Symptoms of a hiatal hernia may include heartburn, trouble swallowing, or sore throat. Management of a hiatal hernia includes eating frequent small meals instead of three large meals a day. Get help right away if you vomit blood, have bright red blood in your stools, or have black, tarry stools. This information is not intended to replace advice given to you by your health care provider. Make sure you discuss any questions you have with your health care provider. Document Revised: 08/22/2021 Document Reviewed: 08/22/2021 Elsevier Patient Education  2024 [employer]   Credit: [employer]iana [employer] Spondylosis patient handout from [facility]  Thoracic spondylosis is midback  Wear and  tear on the joints and disks of the spine that cause neck and back pain and stiffness  Symptoms include neck or back pain, decreased flexibility and range of motion of back and neck  Treatment includes activity modification, medications, massage, compresses, physical therapy, spinal injections, surgery  Involves orthopaedics & rehabilitation, spine surgery, neurology, physical medicine & rehabilitation Related Terms: Spinal Osteoarthritis  Print  Share Overview Neck or back pain that develops as we age may be a sign of spondylosis, a degenerative condition that affects the spine. Spondylosis is a normal, age-related condition. In fact, an estimated 90% of adults aged 49 years or older have this condition. However, most people with spondylosis are generally asymptomatic and don't experience significant pain or other issues related to these arthritic changes.    As we age, the joints and disks of the spine may start to wear out, which can cause pain and stiffness in the neck and back. In addition, associated bone spurs and disk herniation can potentially lead to nerve compression in the spine, which can cause symptoms of pain, numbness, or weakness in the arms or legs.    Spondylosis affects men and women equally, but it is more common in certain individuals, including athletes, people who do heavy physical labor for a living, and smokers.    Even though spondylosis doesn't necessarily cause symptoms, there are safe and effective treatment options available for patients who do experience pain or neurologic changes related to this degenerative condition.  What is spondylosis? Spondylosis refers to the development of age-related arthritis that affects the spine. More specifically, it involves a number of different degenerative issues that affect the disks and joints of the spine.     For example, over time, disks may become dehydrated, losing height as a result. Thus, they provide less  cushioning and can actually "crack," which can lead to herniations. Similarly, the cartilage in the spine's joints can wear down, resulting in the formation of bone spurs.     These arthritic changes can not only cause pain in the neck and back but can also lead to a narrowing of the spinal canal (also known as stenosis), which may compress nerves. Individuals with spinal stenosis may have neurologic symptoms in their extremities, such as numbness, tingling, or weakness radiating into their arms or legs. In severe cases, it can even lead to loss of bladder or bowel control.    It is important to remember that while spondylosis is essentially ubiquitous in the older population, not every patient will experience symptoms or require treatment for this  disease.  What causes spondylosis? The most common cause of spondylosis is the cumulative joint stress that occurs as people age. It predisposes them to osteoarthritis, a common form of arthritis typically associated with progressive "wear and tear" on joint cartilage.    Spondylosis may also arise as a result of previous trauma to the spine. For instance, this condition is more likely to occur among patients who:  Have had car accidents, falls, or other spinal injuries Play competitive sports Perform strenuous physical activity at work Have had a previous neck or back surgery What are the risk factors for spondylosis? The risk factors for spondylosis include the following:  Age 75 or older Osteoarthritis affecting other joints Physically demanding jobs that may require heavy lifting or bending High-level athletics Neck or back injury Previous spine surgery Obesity Smoking Physical inactivity What are the symptoms of spondylosis? Many with spondylosis do not have pain or other neurologic issues. However, some people may experience symptoms that either occur briefly or severely--or develop gradually over time.    Signs and symptoms of  spondylosis may include:  Neck or back pain that worsens when coughing or sneezing Decreased flexibility in and range of motion of the neck or back "Clicking" sounds from the spine Pain radiating from the neck or lower back into the arms or legs, respectively Numbness, tingling, or weakness in the arms or legs Unsteady gait Muscle spasms Headaches Bladder or bowel dysfunction (in severe cases) How is spondylosis diagnosed? An individual can be diagnosed with spondylosis after their doctor obtains a medical history, performs a physical exam, and reviews diagnostic tests. Your doctor may also use this information to rule out other conditions, such as muscle strains, fractures, and cancer.    When providing your medical history, it is important to share any history of spinal injuries or previous surgeries. You should also mention how physically active you are and if you play (or have played) sports.    During the physical exam, your doctor will likely observe your walking ability and assess the range of motion of your spine to determine if you have any pain or stiffness. Palpation of the neck and back can also help identify the location of any tenderness associated with spondylosis. Your neurologic function will also be evaluated, including your sensation, strength, and reflexes. Special tests may also be performed to check for any signs of nerve impingement (from a disk herniation, for example).    Finally, imaging studies like X-rays, computed tomography (CT scan), and magnetic resonance imaging (MRI) may also be used to identify any arthritic changes in the spine. Lab tests may also be useful for ruling out more concerning diseases, such as an infection or cancer.  How is spondylosis treated? Different treatments are available to treat spondylosis, depending on the type and severity of symptoms.    Treatments for milder spondylosis include:  Activity modification, such as avoiding activities  that cause pain Medications, such as non-steroidal anti-inflammatory drugs (NSAIDs), over-the-counter analgesics (acetaminophen , for example), and muscle relaxants Hot/cold compresses, massage, and traction Physical therapy, such as strengthening and flexibility exercises and ergonomic education Spinal injections, such as an epidural around nerves and facet joints In more severe cases, surgery might be the best course of action. It may involve the following procedures:  Decompression (laminectomy, discectomy), which involves the removal of bone spurs, disk herniations, and arthritis to relieve compression of the nerves and address neurologic symptoms Fusion, which is the formation of bone across adjacent vertebral bodies to  stabilize the spine and relieve pain What is the outlook for people with spondylosis? Fortunately, most individuals with spondylosis will not experience symptoms and may never even realize that they have arthritis in their spines. However, for those who experience pain or neurologic changes due to nerve compression, safe and effective treatments are available.

## 2024-05-28 NOTE — Progress Notes (Signed)
 Established Patient Office Visit  Subjective   Patient ID: RAYETTE MOGG, female    DOB: June 27, 1966  Age: 58 y.o. MRN: [id number]  No chief complaint on file.   58y/o caucasian female with questions on her new medications and recent testing from cardiology.  Patient stated she did received another call from cardiology nurse today and it helped her to understand recent test results better.  Patient denied palpitations/chest pain/headache/dizziness/n/v/d/f/c/shortness of breath.  Stated has been taking metoprolol as instructed and her symptoms have resolved with starting this medication.        Review of Systems  Constitutional:  Negative for chills, diaphoresis, fever and malaise/fatigue.  Respiratory:  Negative for shortness of breath.   Cardiovascular:  Negative for chest pain and palpitations.  Gastrointestinal:  Negative for diarrhea, nausea and vomiting.  Neurological:  Negative for dizziness and headaches.      Objective:     Resp 16    Physical Exam Vitals reviewed.  Constitutional:      General: She is awake. She is not in acute distress.    Appearance: Normal appearance. She is well-developed, well-groomed and overweight. She is not ill-appearing, toxic-appearing or diaphoretic.  HENT:     Head: Normocephalic and atraumatic.     Jaw: There is normal jaw occlusion.     Salivary Glands: Right salivary gland is not diffusely enlarged. Left salivary gland is not diffusely enlarged.     Right Ear: Hearing and external ear normal.     Left Ear: Hearing and external ear normal.     Nose: Nose normal. No congestion or rhinorrhea.     Mouth/Throat:     Lips: Pink. No lesions.     Mouth: Mucous membranes are moist.     Pharynx: Oropharynx is clear.  Eyes:     General: Lids are normal. Vision grossly intact. Gaze aligned appropriately. No scleral icterus.       Right eye: No discharge.        Left eye: No discharge.     Extraocular Movements: Extraocular movements  intact.     Conjunctiva/sclera: Conjunctivae normal.     Pupils: Pupils are equal, round, and reactive to light.  Neck:     Trachea: Trachea normal.  Cardiovascular:     Rate and Rhythm: Normal rate and regular rhythm.  Pulmonary:     Effort: Pulmonary effort is normal. No respiratory distress.     Breath sounds: Normal breath sounds and air entry. No stridor or transmitted upper airway sounds. No wheezing.     Comments: Spoke full sentences without difficulty; no cough observed in exam room Musculoskeletal:        General: Normal range of motion.     Cervical back: Normal range of motion and neck supple. No rigidity.  Lymphadenopathy:     Head:     Right side of head: No submandibular or preauricular adenopathy.     Left side of head: No submandibular or preauricular adenopathy.     Cervical:     Right cervical: No superficial cervical adenopathy.    Left cervical: No superficial cervical adenopathy.  Skin:    General: Skin is warm and dry.     Capillary Refill: Capillary refill takes less than 2 seconds.     Coloration: Skin is not ashen, cyanotic, jaundiced, mottled, pale or sallow.     Findings: No abrasion, bruising, burn, erythema, signs of injury, laceration, petechiae, rash or wound.  Neurological:     General:  No focal deficit present.     Mental Status: She is alert and oriented to person, place, and time. Mental status is at baseline.     GCS: GCS eye subscore is 4. GCS verbal subscore is 5. GCS motor subscore is 6.     Cranial Nerves: No dysarthria or facial asymmetry.     Motor: Motor function is intact. No weakness, tremor, atrophy, abnormal muscle tone or seizure activity.     Coordination: Coordination is intact. Coordination normal.     Gait: Gait is intact. Gait normal.     Comments: In/out of chair without difficulty; gait sure and steady; bilateral hand grasp equal 5/5  Psychiatric:        Attention and Perception: Attention and perception normal.        Mood  and Affect: Mood and affect normal.        Speech: Speech normal.        Behavior: Behavior normal. Behavior is cooperative.        Thought Content: Thought content normal.        Cognition and Memory: Cognition and memory normal.        Judgment: Judgment normal.      No results found for any visits on 05/28/24. Results Component Value Reference Range Notes  EKG Reviewed date:04/30/2024 06:35:35 PM Interpretation:Right bundle branch block Performing Lab: Notes/Report: Right bundle branch block  Comp Metabolic Panel Reviewed date:05/19/2024 10:56:29 AM Interpretation: Performing Lab: Notes/Report: Testing Performed at: [facility], [address], [address], [place], [place] [address]  Glucose 140 70-99 mg/dL    BUN 21 3-73 mg/dL    Creatinine 9.15 9.39-8.69 mg/dl    [id number] 80 >39 calc Stage 1 > 90 ML/Min plus Albuminuria;Stage 2 60-89 ML/MIN;Stage 3 30-59 ML/MIN;Stage 4 15-29 ML/MIN;Stage 5 <15 ML/MIN  Sodium 136 136-145 mmol/L    Potassium 4.4 3.5-5.5 mmol/L    Chloride 101 98-107 mmol/L    CO2 27 22-32 mmol/L    Anion Gap 12.4 6.0-20.0 mmol/L    Calcium  9.6 8.6-10.3 mg/dL    CA-corrected 0.61 1.39-89.69 mg/dL    Protein, Total 6.5 3.9-1.6 g/dL    Albumin 4.3 6.5-5.1 g/dL    TBIL 0.3 9.6-8.9 mg/dL    ALP 75 61-873 U/L    AST 21 0-39 U/L    ALT 41 0-52 U/L    Comp Metabolic Panel Reviewed date:11/27/2023 01:59:31 PM Interpretation: Performing Lab: Notes/Report: Testing Performed at: [facility], [address], [address], [place], [place] [address]  Glucose 102 70-99 mg/dL    BUN 18 3-73 mg/dL    Creatinine 9.21 9.39-8.69 mg/dl    [id number] 88 >39 calc In accordance with recommendations from NKF-ASN Task Force, [facility] has updated its eGFR calc to the 2021 CKD-EDI equation that estimates kidney function without a race variable;Stage 1 > 90 ML/Min plus Albuminuria;Stage 2 60-89 ML/MIN;Stage 3 30-59 ML/MIN;Stage 4 15-29 ML/MIN;Stage 5 <15 ML/MIN  Sodium 137 136-145  mmol/L    Potassium 4.1 3.5-5.5 mmol/L    Chloride 100 98-107 mmol/L    CO2 29 22-32 mmol/L    Anion Gap 12.4 6.0-20.0 mmol/L    Calcium  9.4 8.6-10.3 mg/dL    CA-corrected 0.94 1.39-89.69 mg/dL    Protein, Total 6.6 3.9-1.6 g/dL    Albumin 4.4 6.5-5.1 g/dL    TBIL 0.3 9.6-8.9 mg/dL    ALP 67 61-873 U/L    AST 18 0-39 U/L    ALT 35 0-52 U/L    Comp Metabolic Panel Reviewed  date:10/29/2023 10:50:17 AM Interpretation: Performing Lab: Notes/Report: Testing Performed at: [facility], [address], [address], [place], [place] [address]  Glucose 146 70-99 mg/dL    BUN 21 3-73 mg/dL    Creatinine 9.09 9.39-8.69 mg/dl    [id number] 74 >39 calc In accordance with recommendations from NKF-ASN Task Force, [facility] has updated its eGFR calc to the 2021 CKD-EDI equation that estimates kidney function without a race variable;Stage 1 > 90 ML/Min plus Albuminuria;Stage 2 60-89 ML/MIN;Stage 3 30-59 ML/MIN;Stage 4 15-29 ML/MIN;Stage 5 <15 ML/MIN  Sodium 137 136-145 mmol/L    Potassium 4.4 3.5-5.5 mmol/L    Chloride 100 98-107 mmol/L    CO2 27 22-32 mmol/L    Anion Gap 14.7 6.0-20.0 mmol/L    Calcium  9.7 8.6-10.3 mg/dL    CA-corrected 0.71 1.39-89.69 mg/dL    Protein, Total 6.9 3.9-1.6 g/dL    Albumin 4.5 6.5-5.1 g/dL    TBIL 0.3 9.6-8.9 mg/dL    ALP 73 61-873 U/L    AST 19 0-39 U/L    ALT 34 0-52 U/L    Pap/HPV ([place]) Reviewed date:09/25/2023 11:32:18 AM Interpretation:NILM, HRHPV neg Performing Lab: Notes/Report: NILM, HRHPV neg  Comp Metabolic Panel Reviewed date:02/21/2024 11:39:28 AM Interpretation: Performing Lab: Notes/Report: Testing Performed at: [facility], [address], [address], [place], [place] [address]  Glucose 132 70-99 mg/dL    BUN 19 3-73 mg/dL    Creatinine 9.24 9.39-8.69 mg/dl    [id number] 92 >39 calc Stage 1 > 90 ML/Min plus Albuminuria;Stage 2 60-89 ML/MIN;Stage 3 30-59 ML/MIN;Stage 4 15-29 ML/MIN;Stage 5 <15 ML/MIN  Sodium 136 136-145 mmol/L    Potassium 4.4  3.5-5.5 mmol/L    Chloride 99 98-107 mmol/L    CO2 30 22-32 mmol/L    Anion Gap 11.1 6.0-20.0 mmol/L    Calcium  10.0 8.6-10.3 mg/dL    CA-corrected 0.43 1.39-89.69 mg/dL    Protein, Total 6.8 3.9-1.6 g/dL    Albumin 4.5 6.5-5.1 g/dL    TBIL 0.4 9.6-8.9 mg/dL    ALP 82 61-873 U/L    AST 19 0-39 U/L    ALT 41 0-52 U/L    Microalbumin Panel Reviewed date:01/21/2024 05:37:23 PM Interpretation:No protein Performing Lab: Notes/Report: Testing Performed at: [facility], [address], [address], [place], [place] [address]  MA/CR ratio <24.2 0.0-30.0 mg/G    UMA <0.7      UCR 29      Comp Metabolic Panel Reviewed date:01/21/2024 05:37:07 PM Interpretation:Normal Performing Lab: Notes/Report: Testing Performed at: [facility], [address], [address], [place], [place] [address]  Glucose 86 70-99 mg/dL    BUN 17 3-73 mg/dL    Creatinine 9.24 9.39-8.69 mg/dl    [id number] 92 >39 calc Stage 1 > 90 ML/Min plus Albuminuria;Stage 2 60-89 ML/MIN;Stage 3 30-59 ML/MIN;Stage 4 15-29 ML/MIN;Stage 5 <15 ML/MIN  Sodium 137 136-145 mmol/L    Potassium 4.1 3.5-5.5 mmol/L    Chloride 97 98-107 mmol/L    CO2 29 22-32 mmol/L    Anion Gap 15.7 6.0-20.0 mmol/L    Calcium  9.8 8.6-10.3 mg/dL    CA-corrected 0.73 1.39-89.69 mg/dL    Protein, Total 6.9 3.9-1.6 g/dL    Albumin 4.7 6.5-5.1 g/dL    TBIL 0.6 9.6-8.9 mg/dL    ALP 70 61-873 U/L    AST 22 0-39 U/L    ALT 40 0-52 U/L    Hemoglobin A1c Reviewed date:01/21/2024 05:36:21 PM Interpretation:6.5 Performing Lab: Notes/Report: Testing Performed at: [facility], [address], [address],  [place], [place] [address]  eAG 140      Hgb A1c 6.5 4.8-5.6 % Prediabetes: 5.7-6.4%  Diabetes: >/= 6.5%   Comp Metabolic Panel Reviewed date:12/27/2023 12:39:25 PM Interpretation: Performing Lab: Notes/Report: Testing Performed at: [facility], [address], [address], [place], [place] [address]  Glucose 122 70-99 mg/dL    BUN 22 3-73 mg/dL     Creatinine 9.01 9.39-8.69 mg/dl    [id number] 67 >39 calc In accordance with recommendations from NKF-ASN Task Force, [facility] has updated its eGFR calc to the 2021 CKD-EDI equation that estimates kidney function without a race variable;Stage 1 > 90 ML/Min plus Albuminuria;Stage 2 60-89 ML/MIN;Stage 3 30-59 ML/MIN;Stage 4 15-29 ML/MIN;Stage 5 <15 ML/MIN  Sodium 137 136-145 mmol/L    Potassium 4.4 3.5-5.5 mmol/L    Chloride 99 98-107 mmol/L    CO2 30 22-32 mmol/L    Anion Gap 12.6 6.0-20.0 mmol/L    Calcium  9.8 8.6-10.3 mg/dL    CA-corrected 0.60 1.39-89.69 mg/dL    Protein, Total 6.9 3.9-1.6 g/dL    Albumin 4.5 6.5-5.1 g/dL    TBIL 0.4 9.6-8.9 mg/dL    ALP 73 61-873 U/L    AST 19 0-39 U/L    ALT 37 0-52 U/L     Latest Reference Range & Units 07/18/23 08:00  Sodium 134 - 144 mmol/L 138  Potassium 3.5 - 5.2 mmol/L 4.9  Chloride 96 - 106 mmol/L 99  Glucose 70 - 99 mg/dL 875 (H)  BUN 6 - 24 mg/dL 21  Creatinine 9.42 - 8.99 mg/dL 9.17  Calcium  8.7 - 10.2 mg/dL 9.3  BUN/Creatinine Ratio 9 - 23  26 (H)  eGFR >59 mL/min/1.73 83  Phosphorus 3.0 - 4.3 mg/dL 4.2  Alkaline Phosphatase 44 - 121 IU/L 82  Albumin 3.8 - 4.9 g/dL 4.5  Uric Acid 3.0 - 7.2 mg/dL 7.8 (H)  AST 0 - 40 IU/L 20  ALT 0 - 32 IU/L 36 (H)  Total Protein 6.0 - 8.5 g/dL 6.8  Total Bilirubin 0.0 - 1.2 mg/dL 0.4  GGT 0 - 60 IU/L 38  Estimated CHD Risk 0.0 - 1.0 times avg.  < 0.5  LDH 119 - 226 IU/L 151  Total CHOL/HDL Ratio 0.0 - 4.4 ratio 2.8  Cholesterol, Total 100 - 199 mg/dL 829  HDL Cholesterol >60 mg/dL 61  Triglycerides 0 - 850 mg/dL 884  VLDL Cholesterol Cal 5 - 40 mg/dL 20  LDL Chol Calc (NIH) 0 - 99 mg/dL 89  Iron 27 - 840 ug/dL 73  Globulin, Total 1.5 - 4.5 g/dL 2.3  WBC 3.4 - 89.1 k89Z6/lO 6.5  RBC 3.77 - 5.28 x10E6/uL 5.19  Hemoglobin 11.1 - 15.9 g/dL 84.4  HCT 65.9 - 53.3 % 45.5  MCV 79 - 97 fL 88  MCH 26.6 - 33.0 pg 29.9  MCHC 31.5 - 35.7 g/dL 65.8  RDW 88.2 - 84.5 % 12.3  Platelets 150 - 450  x10E3/uL 388  Neutrophils Not Estab. % 61  Immature Granulocytes Not Estab. % 0  NEUT# 1.4 - 7.0 x10E3/uL 4.0  Lymphs Abs 0.7 - 3.1 x10E3/uL 1.7  Monocytes Absolute 0.1 - 0.9 x10E3/uL 0.5  Basophils Absolute 0.0 - 0.2 x10E3/uL 0.0  Immature Grans (Abs) 0.0 - 0.1 x10E3/uL 0.0  Lymphs Not Estab. % 26  Monocytes Not Estab. % 7  Basos Not Estab. % 1  Eos Not Estab. % 5  EOS (ABSOLUTE) 0.0 - 0.4 x10E3/uL 0.3  Hemoglobin A1C 4.8 - 5.6 % 6.5 (H)  TSH 0.450 -  4.500 uIU/mL 1.010  Thyroxine (T4) 4.5 - 12.0 ug/dL 9.6  Free Thyroxine Index 1.2 - 4.9  2.7  T3 Uptake Ratio 24 - 39 % 28  (H): Data is abnormally high  The 10-year ASCVD risk score (Arnett DK, et al., 2019) is: 4.1%   CLINICAL DATA:  This over-read does not include interpretation of cardiac or coronary anatomy or pathology. The Cardiac PET CT interpretation by the cardiologist is attached.   COMPARISON:  None Available.   FINDINGS: Vascular: No acute abnormality.   Mediastinum/Nodes: Small hiatal hernia. Calcified mediastinal and hilar lymph nodes. Prominent/mildly enlarged mediastinal lymph nodes for instance a precarinal lymph node measuring 10 mm in short axis on image 5/3.   Lungs/Pleura: Bilateral linear bands of atelectasis/scarring.   Upper Abdomen: Small hiatal hernia.   Musculoskeletal: Thoracic spondylosis.   IMPRESSION: 1. Prominent/mildly enlarged mediastinal lymph nodes, nonspecific but possibly reactive. 2. Small hiatal hernia.     Electronically Signed   By: Reyes Holder M.D.   On: 05/27/2024 21:01   The study is normal. The study is low risk.    LV perfusion is normal. There is no evidence of ischemia. There is no  evidence of infarction.    Rest left ventricular function is normal. Rest EF: 69%. Stress left  ventricular function is normal. Stress EF: 74%. End diastolic cavity size  is normal. End systolic cavity size is normal.    Myocardial blood flow was computed to be 1.33ml/g/min at rest  and  3.08ml/g/min at stress. Global myocardial blood flow reserve was 2.85 and  was normal.    Coronary calcium  was absent on the attenuation correction CT images. ...  Assessment & Plan:   Problem List Items Addressed This Visit       Respiratory   Hiatal hernia with GERD - Primary     Musculoskeletal and Integument   Thoracic spondylosis without myelopathy     Immune and Lymphatic   Enlargement of lymph node  Slightly enlarged lymph nodes in chest to schedule follow up with [facility] consider further imaging.  Recent labs at [facility] were stable   last CBC Jan 2025.  Repeat ordered nonfasting today patient to schedule with RN Karene.  CMETs stable.  Exitcare handout lymphadenopathy  Patient verbalized understanding information/instructions and had no further questions at this time.  Hiatal hernia and thoracic spondylosis also incidental on cardiac PET scan.  Hiatal Hernia was noted on 2004 EGD therefore stable.  Exitcare handout hiatal hernia  [employer] [facility] handout on spondylosis.  Follow up if new or worsening symptoms with PCM/GI.  Patient agreed with plan of care and had no further questions at this time.  Return if symptoms worsen or fail to improve, for keep scheduled follow up appts with cardiology and PCM.    Ellouise DELENA Hope, NP

## 2024-06-15 ENCOUNTER — Other Ambulatory Visit (HOSPITAL_COMMUNITY): Payer: Self-pay

## 2024-06-17 ENCOUNTER — Other Ambulatory Visit (HOSPITAL_COMMUNITY): Payer: Self-pay

## 2024-06-18 ENCOUNTER — Other Ambulatory Visit (HOSPITAL_COMMUNITY): Payer: Self-pay

## 2024-06-18 MED ORDER — EZETIMIBE 10 MG PO TABS
10.0000 mg | ORAL_TABLET | Freq: Every day | ORAL | 0 refills | Status: AC
  Filled 2024-06-18: qty 90, 90d supply, fill #0

## 2024-06-18 MED ORDER — OZEMPIC (1 MG/DOSE) 4 MG/3ML ~~LOC~~ SOPN
1.0000 mg | PEN_INJECTOR | SUBCUTANEOUS | 1 refills | Status: AC
  Filled 2024-06-18 – 2024-06-24 (×2): qty 3, 28d supply, fill #0

## 2024-06-18 MED ORDER — OZEMPIC (1 MG/DOSE) 4 MG/3ML ~~LOC~~ SOPN
1.0000 mg | PEN_INJECTOR | SUBCUTANEOUS | 1 refills | Status: AC
  Filled 2024-06-18 – 2024-07-22 (×4): qty 3, 28d supply, fill #0

## 2024-06-22 ENCOUNTER — Other Ambulatory Visit: Payer: Self-pay | Admitting: Family Medicine

## 2024-06-22 ENCOUNTER — Other Ambulatory Visit (HOSPITAL_COMMUNITY): Payer: Self-pay

## 2024-06-22 ENCOUNTER — Encounter: Payer: Self-pay | Admitting: Registered Nurse

## 2024-06-22 DIAGNOSIS — R599 Enlarged lymph nodes, unspecified: Secondary | ICD-10-CM

## 2024-06-22 DIAGNOSIS — Z1231 Encounter for screening mammogram for malignant neoplasm of breast: Secondary | ICD-10-CM

## 2024-06-23 ENCOUNTER — Ambulatory Visit: Admitting: General Practice

## 2024-06-23 ENCOUNTER — Other Ambulatory Visit (HOSPITAL_COMMUNITY): Payer: Self-pay

## 2024-06-23 ENCOUNTER — Other Ambulatory Visit: Payer: Self-pay | Admitting: Registered Nurse

## 2024-06-23 DIAGNOSIS — R599 Enlarged lymph nodes, unspecified: Secondary | ICD-10-CM

## 2024-06-23 DIAGNOSIS — E119 Type 2 diabetes mellitus without complications: Secondary | ICD-10-CM

## 2024-06-23 NOTE — Telephone Encounter (Signed)
 Discussed with patient also to follow up with PCM as history of smoking and USPTF recommending lung cancer screening also over age 59.  Patient has appt scheduled in January.  CBC drawn today.  Epic reviewed no history of chest xray.    Narrative & Impression  CLINICAL DATA:  This over-read does not include interpretation of cardiac or coronary anatomy or pathology. The Cardiac PET CT interpretation by the cardiologist is attached.   COMPARISON:  None Available.   FINDINGS: Vascular: No acute abnormality.   Mediastinum/Nodes: Small hiatal hernia. Calcified mediastinal and hilar lymph nodes. Prominent/mildly enlarged mediastinal lymph nodes for instance a precarinal lymph node measuring 10 mm in short axis on image 5/3.   Lungs/Pleura: Bilateral linear bands of atelectasis/scarring.   Upper Abdomen: Small hiatal hernia.   Musculoskeletal: Thoracic spondylosis.   IMPRESSION: 1. Prominent/mildly enlarged mediastinal lymph nodes, nonspecific but possibly reactive. 2. Small hiatal hernia.     Electronically Signed   By: Reyes Holder M.D.   On: 05/27/2024 21:01  Last CMET Nov 2025.  Hgba1c 6.12 Oct 2023.  CBC Jan 2025.  Patient PCM [facility]  Patient reported medication still working and has not had any further heart symptoms since starting metoprolol . Patient asked if she will need  Patient had questions on EKG, echocardiogram.  Discussed RBBB on EKG and mild leakage of mitral valve calcification of leaflets noted echocardiogram.  Stress test normal for heart function.  Patient verbalized understanding information/instructions, agreed with plan of care and had no further questions at this time.

## 2024-06-24 ENCOUNTER — Encounter: Payer: Self-pay | Admitting: Registered Nurse

## 2024-06-24 ENCOUNTER — Other Ambulatory Visit: Payer: Self-pay

## 2024-06-24 ENCOUNTER — Ambulatory Visit: Payer: Self-pay | Admitting: Registered Nurse

## 2024-06-24 ENCOUNTER — Other Ambulatory Visit (HOSPITAL_COMMUNITY): Payer: Self-pay

## 2024-06-24 DIAGNOSIS — R599 Enlarged lymph nodes, unspecified: Secondary | ICD-10-CM

## 2024-06-24 DIAGNOSIS — R7401 Elevation of levels of liver transaminase levels: Secondary | ICD-10-CM

## 2024-06-24 DIAGNOSIS — E790 Hyperuricemia without signs of inflammatory arthritis and tophaceous disease: Secondary | ICD-10-CM

## 2024-06-24 LAB — CMP12+LP+TP+TSH+6AC+CBC/D/PLT
ALT: 42 IU/L — ABNORMAL HIGH (ref 0–32)
AST: 23 IU/L (ref 0–40)
Albumin: 4.4 g/dL (ref 3.8–4.9)
Alkaline Phosphatase: 90 IU/L (ref 49–135)
BUN/Creatinine Ratio: 22 (ref 9–23)
BUN: 18 mg/dL (ref 6–24)
Basophils Absolute: 0 x10E3/uL (ref 0.0–0.2)
Basos: 0 %
Bilirubin Total: 0.3 mg/dL (ref 0.0–1.2)
Calcium: 9.3 mg/dL (ref 8.7–10.2)
Chloride: 98 mmol/L (ref 96–106)
Chol/HDL Ratio: 2.7 ratio (ref 0.0–4.4)
Cholesterol, Total: 166 mg/dL (ref 100–199)
Creatinine, Ser: 0.81 mg/dL (ref 0.57–1.00)
EOS (ABSOLUTE): 0.2 x10E3/uL (ref 0.0–0.4)
Eos: 3 %
Estimated CHD Risk: <0.5 times avg. (ref 0.0–1.0)
Free Thyroxine Index: 2.3 (ref 1.2–4.9)
GGT: 57 IU/L (ref 0–60)
Globulin, Total: 1.9 g/dL (ref 1.5–4.5)
Glucose: 165 mg/dL — ABNORMAL HIGH (ref 70–99)
HDL: 61 mg/dL (ref >39)
Hematocrit: 44 % (ref 34.0–46.6)
Hemoglobin: 14.9 g/dL (ref 11.1–15.9)
Immature Grans (Abs): 0 x10E3/uL (ref 0.0–0.1)
Immature Granulocytes: 0 %
Iron: 81 ug/dL (ref 27–159)
LDH: 162 IU/L (ref 119–226)
LDL Chol Calc (NIH): 82 mg/dL (ref 0–99)
Lymphocytes Absolute: 1.7 x10E3/uL (ref 0.7–3.1)
Lymphs: 23 %
MCH: 29.8 pg (ref 26.6–33.0)
MCHC: 33.9 g/dL (ref 31.5–35.7)
MCV: 88 fL (ref 79–97)
Monocytes Absolute: 0.5 x10E3/uL (ref 0.1–0.9)
Monocytes: 7 %
Neutrophils Absolute: 4.8 x10E3/uL (ref 1.4–7.0)
Neutrophils: 67 %
Phosphorus: 4.2 mg/dL (ref 3.0–4.3)
Platelets: 431 x10E3/uL (ref 150–450)
Potassium: 4.2 mmol/L (ref 3.5–5.2)
RBC: 5 x10E6/uL (ref 3.77–5.28)
RDW: 12.6 % (ref 11.7–15.4)
Sodium: 137 mmol/L (ref 134–144)
T3 Uptake Ratio: 27 % (ref 24–39)
T4, Total: 8.5 ug/dL (ref 4.5–12.0)
TSH: 1.2 u[IU]/mL (ref 0.450–4.500)
Total Protein: 6.3 g/dL (ref 6.0–8.5)
Triglycerides: 129 mg/dL (ref 0–149)
Uric Acid: 7.3 mg/dL — ABNORMAL HIGH (ref 3.0–7.2)
VLDL Cholesterol Cal: 23 mg/dL (ref 5–40)
WBC: 7.2 x10E3/uL (ref 3.4–10.8)
eGFR: 84 mL/min/1.73 (ref >59)

## 2024-06-24 LAB — COMPREHENSIVE METABOLIC PANEL WITH GFR: CO2: 23 mmol/L (ref 20–29)

## 2024-06-24 MED ORDER — VEOZAH 45 MG PO TABS
45.0000 mg | ORAL_TABLET | Freq: Every day | ORAL | 1 refills | Status: AC
  Filled 2024-06-24 (×3): qty 90, 90d supply, fill #0

## 2024-06-25 ENCOUNTER — Other Ambulatory Visit (HOSPITAL_COMMUNITY): Payer: Self-pay

## 2024-06-25 ENCOUNTER — Other Ambulatory Visit: Payer: Self-pay

## 2024-06-30 ENCOUNTER — Other Ambulatory Visit (HOSPITAL_COMMUNITY): Payer: Self-pay

## 2024-07-15 ENCOUNTER — Other Ambulatory Visit: Payer: Self-pay

## 2024-07-15 ENCOUNTER — Other Ambulatory Visit (HOSPITAL_COMMUNITY): Payer: Self-pay

## 2024-07-16 ENCOUNTER — Ambulatory Visit: Attending: Internal Medicine | Admitting: Internal Medicine

## 2024-07-16 ENCOUNTER — Encounter: Payer: Self-pay | Admitting: Internal Medicine

## 2024-07-16 VITALS — BP 100/70 | HR 82 | Ht 66.0 in | Wt 227.0 lb

## 2024-07-16 DIAGNOSIS — R002 Palpitations: Secondary | ICD-10-CM | POA: Diagnosis not present

## 2024-07-16 DIAGNOSIS — R079 Chest pain, unspecified: Secondary | ICD-10-CM

## 2024-07-16 DIAGNOSIS — I4510 Unspecified right bundle-branch block: Secondary | ICD-10-CM | POA: Diagnosis not present

## 2024-07-16 DIAGNOSIS — E785 Hyperlipidemia, unspecified: Secondary | ICD-10-CM

## 2024-07-16 DIAGNOSIS — E1169 Type 2 diabetes mellitus with other specified complication: Secondary | ICD-10-CM

## 2024-07-16 DIAGNOSIS — R0602 Shortness of breath: Secondary | ICD-10-CM

## 2024-07-16 DIAGNOSIS — I10 Essential (primary) hypertension: Secondary | ICD-10-CM | POA: Diagnosis not present

## 2024-07-16 NOTE — Progress Notes (Signed)
" °  Cardiology Office Note:  .   Date:  07/16/2024  ID:  Christine Rojas, DOB Jul 24, 1965, MRN [id number] PCP: Claudene Pellet, MD  [facility] Providers Cardiologist:  None     History of Present Illness: .   Christine Rojas is a 59 y.o. female with history of right bundle branch block, hypertension, hyperlipidemia, type 2 diabetes mellitus, and GERD, who presents for follow-up of chest pain, shortness of breath, palpitations, and RBBB.  I met her in 04/2024, at which time she complained of intermittent shortness of breath attributed to seasonal allergies.  She also noted sporadic migratory chest pains and palpitations.  We agreed to obtain an echocardiogram, which showed normal biventricular systolic function with mild mitral regurgitation.  Subsequent myocardial PET/CT was normal without evidence of ischemia or scar.  Today, Christine Rojas reports that she has been feeling well without chest pain, shortness of breath, palpitations, lightheadedness, or edema.  She is not sure how much of her symptomatic improvement was due to addition of metoprolol  versus the Joanna Borawski of her allergy season.  She is tolerating the medication well.  ROS: See HPI  Studies Reviewed: SABRA        Myocardial PET/CT (05/27/2024): Low risk study without evidence of ischemia or scar.  LVEF 69% at rest and 74% at stress.  Normal global myocardial blood flow reserve.  No significant coronary artery calcification.  TTE (05/14/2024): Normal LV size and wall thickness.  LVEF 60-65% with normal wall motion and grade 1 diastolic dysfunction.  GLS -16.1%.  Normal RV size and function.  Normal biatrial size.  No pericardial effusion.  Degenerative mitral valve with mild thickening and annular calcification.  Mild MR noted.  Trivial TR.  Normal CVP.  Risk Assessment/Calculations:             Physical Exam:   VS:  BP 100/70 (BP Location: Left Arm, Patient Position: Sitting, Cuff Size: Large)   Pulse 82   Ht 5' 6 (1.676 m)   Wt 227 lb (103  kg)   SpO2 97%   BMI 36.64 kg/m    Wt Readings from Last 3 Encounters:  07/16/24 227 lb (103 kg)  05/06/24 223 lb 6.4 oz (101.3 kg)  09/16/23 225 lb 5 oz (102.2 kg)    General:  NAD. Neck: No JVD or HJR. Lungs: Clear to auscultation bilaterally without wheezes or crackles. Heart: Regular rate and rhythm without murmurs, rubs, or gallops. Abdomen: Soft, nontender, nondistended. Extremities: No lower extremity edema.  ASSESSMENT AND PLAN: .    Right bundle branch block: No symptoms to suggest high-grade AV block.  Plan to repeat EKG in a year to assess for any progression and conduction disease.  Chest pain, shortness of breath, and palpitations: Symptoms have resolved.  It is unclear if this is due to addition of metoprolol  versus Janneth Krasner of allergy season.  Echocardiogram showed normal LVEF with mild MR.  Myocardial PET/CT was normal without evidence of ischemia, scar, or coronary artery calcification.  We have agreed to defer medication changes and additional testing.  Hypertension: Blood pressure well-controlled today.  Continue current medications.  Hyperlipidemia associated with type 2 diabetes mellitus: Continue atorvastatin  to target LDL less than 100 in the setting of DM.  Ongoing management per Dr. Claudene.  Morbid obesity: BMI greater than 35 with comorbidities including hypertension, hyperlipidemia, and type 2 diabetes mellitus.  Lifestyle modifications encouraged.    Dispo: Turn to clinic in 1 year.  Signed, Lonni Hanson, MD  "

## 2024-07-16 NOTE — Patient Instructions (Signed)

## 2024-07-17 ENCOUNTER — Other Ambulatory Visit (HOSPITAL_COMMUNITY): Payer: Self-pay

## 2024-07-17 ENCOUNTER — Other Ambulatory Visit: Payer: Self-pay

## 2024-07-21 ENCOUNTER — Telehealth: Payer: Self-pay | Admitting: Registered Nurse

## 2024-07-21 ENCOUNTER — Other Ambulatory Visit (HOSPITAL_COMMUNITY): Payer: Self-pay

## 2024-07-21 ENCOUNTER — Encounter: Payer: Self-pay | Admitting: Registered Nurse

## 2024-07-21 DIAGNOSIS — E119 Type 2 diabetes mellitus without complications: Secondary | ICD-10-CM

## 2024-07-21 DIAGNOSIS — E782 Mixed hyperlipidemia: Secondary | ICD-10-CM

## 2024-07-21 DIAGNOSIS — K219 Gastro-esophageal reflux disease without esophagitis: Secondary | ICD-10-CM

## 2024-07-21 DIAGNOSIS — I10 Essential (primary) hypertension: Secondary | ICD-10-CM

## 2024-07-21 MED ORDER — ATORVASTATIN CALCIUM 40 MG PO TABS: 40.0000 mg | ORAL_TABLET | Freq: Every day | ORAL | 3 refills | Status: AC

## 2024-07-21 MED ORDER — LISINOPRIL 10 MG PO TABS: 10.0000 mg | ORAL_TABLET | Freq: Every day | ORAL | 1 refills | Status: AC

## 2024-07-21 MED ORDER — METFORMIN HCL 1000 MG PO TABS: 1000.0000 mg | ORAL_TABLET | Freq: Two times a day (BID) | ORAL | 1 refills | Status: AC

## 2024-07-21 MED ORDER — HYDROCHLOROTHIAZIDE 25 MG PO TABS: 25.0000 mg | ORAL_TABLET | Freq: Every day | ORAL | 1 refills | Status: AC

## 2024-07-21 NOTE — Telephone Encounter (Signed)
 Patient reported needs labs in February and will schedule with RN Karene

## 2024-07-21 NOTE — Telephone Encounter (Signed)
 " Latest Reference Range & Units 06/23/24 10:15  Comprehensive metabolic panel with GFR  Rpt  Sodium 134 - 144 mmol/L 137  Potassium 3.5 - 5.2 mmol/L 4.2  Chloride 96 - 106 mmol/L 98  CO2 20 - 29 mmol/L 23  Glucose 70 - 99 mg/dL 834 (H)  BUN 6 - 24 mg/dL 18  Creatinine 9.42 - 8.99 mg/dL 9.18  Calcium  8.7 - 10.2 mg/dL 9.3  BUN/Creatinine Ratio 9 - 23  22  eGFR >59 mL/min/1.73 84  Phosphorus 3.0 - 4.3 mg/dL 4.2  Alkaline Phosphatase 49 - 135 IU/L 90  Albumin 3.8 - 4.9 g/dL 4.4  Uric Acid 3.0 - 7.2 mg/dL 7.3 (H)  AST 0 - 40 IU/L 23  ALT 0 - 32 IU/L 42 (H)  Total Protein 6.0 - 8.5 g/dL 6.3  Total Bilirubin 0.0 - 1.2 mg/dL 0.3  GGT 0 - 60 IU/L 57  Estimated CHD Risk 0.0 - 1.0 times avg.  < 0.5  LDH 119 - 226 IU/L 162  Total CHOL/HDL Ratio 0.0 - 4.4 ratio 2.7  Cholesterol, Total 100 - 199 mg/dL 833  HDL Cholesterol >60 mg/dL 61  Triglycerides 0 - 850 mg/dL 870  VLDL Cholesterol Cal 5 - 40 mg/dL 23  LDL Chol Calc (NIH) 0 - 99 mg/dL 82  Iron 27 - 840 ug/dL 81  Globulin, Total 1.5 - 4.5 g/dL 1.9  WBC 3.4 - 89.1 k89Z6/lO 7.2  RBC 3.77 - 5.28 x10E6/uL 5.00  Hemoglobin 11.1 - 15.9 g/dL 85.0  HCT 65.9 - 53.3 % 44.0  MCV 79 - 97 fL 88  MCH 26.6 - 33.0 pg 29.8  MCHC 31.5 - 35.7 g/dL 66.0  RDW 88.2 - 84.5 % 12.6  Platelets 150 - 450 x10E3/uL 431  Neutrophils Not Estab. % 67  Immature Granulocytes Not Estab. % 0  NEUT# 1.4 - 7.0 x10E3/uL 4.8  Lymphs Abs 0.7 - 3.1 x10E3/uL 1.7  Monocytes Absolute 0.1 - 0.9 x10E3/uL 0.5  Basophils Absolute 0.0 - 0.2 x10E3/uL 0.0  Immature Grans (Abs) 0.0 - 0.1 x10E3/uL 0.0  Lymphs Not Estab. % 23  Monocytes Not Estab. % 7  Basos Not Estab. % 0  Eos Not Estab. % 3  EOS (ABSOLUTE) 0.0 - 0.4 x10E3/uL 0.2  TSH 0.450 - 4.500 uIU/mL 1.200  Thyroxine (T4) 4.5 - 12.0 ug/dL 8.5  Free Thyroxine Index 1.2 - 4.9  2.3  (H): Data is abnormally high Rpt: View report in Results Review for more information Component Value Reference Range Notes  Hemoglobin  A1c Reviewed date:01/21/2024 05:36:21 PM Interpretation:6.5 Performing Lab: Notes/Report: Testing Performed at: [facility], [address], [place], [place] [address]  eAG 140      Hgb A1c 6.5 4.8-5.6 % Prediabetes: 5.7-6.4%  Diabetes: >/= 6.5%   Comp Metabolic Panel Reviewed date:01/21/2024 05:37:07 PM Interpretation:Normal Performing Lab: Notes/Report: Testing Performed at: [facility], [address], [place], [place] [address]  04/30/2024 Height 64.5 in 04/30/2024  Weight 225.8 lbs 04/30/2024  BMI 38.16 kg/m2 04/30/2024  Oximetry 97 04/30/2024  Blood pressure systolic 94 mm Hg 04/30/2024  Blood pressure diastolic 65 mm Hg 04/30/2024  Oximeter pulse 81    1.9 2.0 1.9 1.9  Resulting Agency [facility]  Recheck magnesium with next labs as last checked 2023 nonfasting ordered Dispensed 180 tabs metformin  1000mg  po BID, omeprazole  DR 40mg  po daily #90, atorvastatin  40mg  po daily #90, lisinopril  10mg  po daily #90 and hydrochlorothiazide  25mg  po daily #90 to patient today from  [facility].  Patient agreed with plan of care and had no further questions at this time "

## 2024-07-22 ENCOUNTER — Other Ambulatory Visit: Payer: Self-pay

## 2024-07-22 ENCOUNTER — Other Ambulatory Visit (HOSPITAL_COMMUNITY): Payer: Self-pay

## 2024-07-23 ENCOUNTER — Other Ambulatory Visit (HOSPITAL_COMMUNITY): Payer: Self-pay

## 2024-07-23 ENCOUNTER — Other Ambulatory Visit: Payer: Self-pay

## 2024-07-24 ENCOUNTER — Other Ambulatory Visit (HOSPITAL_COMMUNITY): Payer: Self-pay

## 2024-07-24 ENCOUNTER — Other Ambulatory Visit: Payer: Self-pay

## 2024-07-27 ENCOUNTER — Other Ambulatory Visit (HOSPITAL_COMMUNITY): Payer: Self-pay

## 2024-07-27 ENCOUNTER — Other Ambulatory Visit: Payer: Self-pay

## 2024-07-28 ENCOUNTER — Other Ambulatory Visit (HOSPITAL_COMMUNITY): Payer: Self-pay

## 2024-07-28 ENCOUNTER — Other Ambulatory Visit: Admitting: General Practice

## 2024-07-28 ENCOUNTER — Other Ambulatory Visit: Payer: Self-pay | Admitting: General Practice

## 2024-07-28 DIAGNOSIS — Z0000 Encounter for general adult medical examination without abnormal findings: Secondary | ICD-10-CM

## 2024-07-28 DIAGNOSIS — K219 Gastro-esophageal reflux disease without esophagitis: Secondary | ICD-10-CM

## 2024-07-28 NOTE — Progress Notes (Signed)
 Lab work drawn and will send to [facility] this afternoon. Cc entered for lab results to go to C. Claudene, MD.

## 2024-07-29 ENCOUNTER — Ambulatory Visit: Payer: Self-pay | Admitting: Registered Nurse

## 2024-07-29 ENCOUNTER — Telehealth: Payer: Self-pay | Admitting: Internal Medicine

## 2024-07-29 DIAGNOSIS — E1165 Type 2 diabetes mellitus with hyperglycemia: Secondary | ICD-10-CM

## 2024-07-29 LAB — CMP12+LP+TP+TSH+6AC+CBC/D/PLT
ALT: 43 IU/L — ABNORMAL HIGH (ref 0–32)
AST: 25 IU/L (ref 0–40)
Albumin: 4.4 g/dL (ref 3.8–4.9)
Alkaline Phosphatase: 90 IU/L (ref 49–135)
BUN/Creatinine Ratio: 22 (ref 9–23)
BUN: 17 mg/dL (ref 6–24)
Basophils Absolute: 0 x10E3/uL (ref 0.0–0.2)
Basos: 0 %
Bilirubin Total: 0.3 mg/dL (ref 0.0–1.2)
Calcium: 9.5 mg/dL (ref 8.7–10.2)
Chloride: 97 mmol/L (ref 96–106)
Chol/HDL Ratio: 2.9 ratio (ref 0.0–4.4)
Cholesterol, Total: 171 mg/dL (ref 100–199)
Creatinine, Ser: 0.76 mg/dL (ref 0.57–1.00)
EOS (ABSOLUTE): 0.2 x10E3/uL (ref 0.0–0.4)
Eos: 4 %
Free Thyroxine Index: 2.3 (ref 1.2–4.9)
GGT: 49 IU/L (ref 0–60)
Globulin, Total: 2.4 g/dL (ref 1.5–4.5)
Glucose: 136 mg/dL — ABNORMAL HIGH (ref 70–99)
HDL: 59 mg/dL
Hematocrit: 44.5 % (ref 34.0–46.6)
Hemoglobin: 14.8 g/dL (ref 11.1–15.9)
Immature Grans (Abs): 0 x10E3/uL (ref 0.0–0.1)
Immature Granulocytes: 0 %
Iron: 82 ug/dL (ref 27–159)
LDH: 159 IU/L (ref 119–226)
LDL Chol Calc (NIH): 91 mg/dL (ref 0–99)
Lymphocytes Absolute: 1.7 x10E3/uL (ref 0.7–3.1)
Lymphs: 25 %
MCH: 29.4 pg (ref 26.6–33.0)
MCHC: 33.3 g/dL (ref 31.5–35.7)
MCV: 88 fL (ref 79–97)
Monocytes Absolute: 0.4 x10E3/uL (ref 0.1–0.9)
Monocytes: 6 %
Neutrophils Absolute: 4.5 x10E3/uL (ref 1.4–7.0)
Neutrophils: 65 %
Phosphorus: 4.1 mg/dL (ref 3.0–4.3)
Platelets: 391 x10E3/uL (ref 150–450)
Potassium: 5.4 mmol/L — ABNORMAL HIGH (ref 3.5–5.2)
RBC: 5.04 x10E6/uL (ref 3.77–5.28)
RDW: 12.6 % (ref 11.7–15.4)
Sodium: 138 mmol/L (ref 134–144)
T3 Uptake Ratio: 27 % (ref 24–39)
T4, Total: 8.7 ug/dL (ref 4.5–12.0)
TSH: 1.01 u[IU]/mL (ref 0.450–4.500)
Total Protein: 6.8 g/dL (ref 6.0–8.5)
Triglycerides: 120 mg/dL (ref 0–149)
Uric Acid: 7.6 mg/dL — ABNORMAL HIGH (ref 3.0–7.2)
VLDL Cholesterol Cal: 21 mg/dL (ref 5–40)
WBC: 6.9 x10E3/uL (ref 3.4–10.8)
eGFR: 90 mL/min/1.73

## 2024-07-29 LAB — MAGNESIUM: Magnesium: 1.8 mg/dL (ref 1.6–2.3)

## 2024-07-29 LAB — HGB A1C W/O EAG: Hgb A1c MFr Bld: 7.2 % — ABNORMAL HIGH (ref 4.8–5.6)

## 2024-07-29 NOTE — Telephone Encounter (Signed)
" °*  STAT* If patient is at the pharmacy, call can be transferred to refill team.   1. Which medications need to be refilled? (please list name of each medication and dose if known) metoprolol  succinate (TOPROL -XL) 25 MG 24 hr tablet    2. Would you like to learn more about the convenience, safety, & potential cost savings by using the [facility]? No      3. Are you open to using the [facility] (Type [facility]. No    4. Which pharmacy/location (including street and city if local pharmacy) is medication to be sent to?[facility] [address] [place] - [address]    5. Do they need a 30 day or 90 day supply? 90 day   "

## 2024-07-30 NOTE — Telephone Encounter (Signed)
 Magnesium normal 1.8 and patient had executive panel with Hgba1c completed per [facility]  see results note

## 2024-08-03 NOTE — Telephone Encounter (Signed)
 Returned call to pt, she has been made aware that we sent in enough refills back in October for 1 year to [place].  Pt will call them.

## 2024-08-06 ENCOUNTER — Other Ambulatory Visit (HOSPITAL_COMMUNITY): Payer: Self-pay

## 2024-08-06 ENCOUNTER — Ambulatory Visit

## 2024-08-10 ENCOUNTER — Other Ambulatory Visit (HOSPITAL_COMMUNITY): Payer: Self-pay
